# Patient Record
Sex: Female | Born: 1975 | Race: White | Hispanic: No | Marital: Married | Smoking: Current every day smoker
Health system: Southern US, Community
[De-identification: ages and names within clinical notes are randomized; demographics above are authoritative.]

## PROBLEM LIST (undated history)

## (undated) DIAGNOSIS — F17200 Nicotine dependence, unspecified, uncomplicated: Secondary | ICD-10-CM

## (undated) DIAGNOSIS — I639 Cerebral infarction, unspecified: Secondary | ICD-10-CM

## (undated) DIAGNOSIS — M75100 Unspecified rotator cuff tear or rupture of unspecified shoulder, not specified as traumatic: Secondary | ICD-10-CM

## (undated) DIAGNOSIS — N870 Mild cervical dysplasia: Secondary | ICD-10-CM

## (undated) DIAGNOSIS — G610 Guillain-Barre syndrome: Secondary | ICD-10-CM

## (undated) DIAGNOSIS — R87619 Unspecified abnormal cytological findings in specimens from cervix uteri: Secondary | ICD-10-CM

## (undated) DIAGNOSIS — R87810 Cervical high risk human papillomavirus (HPV) DNA test positive: Secondary | ICD-10-CM

## (undated) DIAGNOSIS — F419 Anxiety disorder, unspecified: Secondary | ICD-10-CM

## (undated) HISTORY — PX: FINGER SURGERY: SHX640

## (undated) HISTORY — DX: Nicotine dependence, unspecified, uncomplicated: F17.200

## (undated) HISTORY — PX: CHOLECYSTECTOMY: SHX55

## (undated) HISTORY — DX: Mild cervical dysplasia: N87.0

## (undated) HISTORY — DX: Guillain-Barre syndrome: G61.0

## (undated) HISTORY — DX: Anxiety disorder, unspecified: F41.9

## (undated) HISTORY — DX: Unspecified rotator cuff tear or rupture of unspecified shoulder, not specified as traumatic: M75.100

## (undated) HISTORY — DX: Unspecified abnormal cytological findings in specimens from cervix uteri: R87.619

## (undated) HISTORY — DX: Cervical high risk human papillomavirus (HPV) DNA test positive: R87.810

---

## 2015-05-22 ENCOUNTER — Emergency Department
Admission: EM | Admit: 2015-05-22 | Discharge: 2015-05-22 | Disposition: A | Discharge status: Home / Self Care | Payer: Worker's Compensation | Attending: Emergency Medicine | Admitting: Emergency Medicine

## 2015-05-22 ENCOUNTER — Emergency Department: Payer: Self-pay

## 2015-05-22 ENCOUNTER — Encounter: Payer: Self-pay | Admitting: Emergency Medicine

## 2015-05-22 DIAGNOSIS — M25512 Pain in left shoulder: Secondary | ICD-10-CM

## 2015-05-22 DIAGNOSIS — Z720 Tobacco use: Secondary | ICD-10-CM | POA: Insufficient documentation

## 2015-05-22 HISTORY — DX: Cerebral infarction, unspecified: I63.9

## 2015-05-22 MED ORDER — OXYCODONE-ACETAMINOPHEN 5-325 MG PO TABS
1.0000 | ORAL_TABLET | Freq: Once | ORAL | Status: AC
  Administered 2015-05-22: 1 via ORAL
  Filled 2015-05-22: qty 1

## 2015-05-22 MED ORDER — OXYCODONE-ACETAMINOPHEN 5-325 MG PO TABS: 1.0000 | ORAL_TABLET | Freq: Four times a day (QID) | ORAL | Status: DC | PRN

## 2015-05-22 NOTE — Discharge Instructions (Signed)

## 2015-05-22 NOTE — ED Notes (Addendum)
Patient ambulatory to triage with steady gait, without difficulty or distress noted; pt reports while at work Alexandria Murray) began having pain to left shoulder after using hammer; workers comp profile indicates UDS required

## 2015-05-22 NOTE — ED Provider Notes (Signed)
[facility] Emergency Department Provider Note  ____________________________________________  Time seen:   I have reviewed the triage vital signs and the nursing notes.   HISTORY  Chief Complaint Shoulder Pain      HPI Alexandria Murray is a 39 y.o. female presents with nontraumatic left shoulder pain currently 9 out of 10. Patient states while using a 4 pound hammer at work she started expressing left shoulder pain radiating into the left arm/forearm. Patient denies any neck pain    Past Medical History  Diagnosis Date  . Stroke     There are no active problems to display for this patient.   Past Surgical History  Procedure Laterality Date  . Cholecystectomy    . Finger surgery      Current Outpatient Rx  Name  Route  Sig  Dispense  Refill  . oxyCODONE-acetaminophen (PERCOCET/ROXICET) 5-325 MG per tablet   Oral   Take 1 tablet by mouth every 6 (six) hours as needed for severe pain.   20 tablet   0     Allergies Review of patient's allergies indicates no known allergies.  No family history on file.  Social History History  Substance Use Topics  . Smoking status: Current Every Day Smoker -- 2.00 packs/day    Types: Cigarettes  . Smokeless tobacco: Not on file  . Alcohol Use: No    Review of Systems  Constitutional: Negative for fever. Eyes: Negative for visual changes. ENT: Negative for sore throat. Cardiovascular: Negative for chest pain. Respiratory: Negative for shortness of breath. Gastrointestinal: Negative for abdominal pain, vomiting and diarrhea. Genitourinary: Negative for dysuria. Musculoskeletal: Negative for back pain. Skin: Negative for rash. Neurological: Negative for headaches, focal weakness or numbness.   10-point ROS otherwise negative.  ____________________________________________   PHYSICAL EXAM:  VITAL SIGNS: ED Triage Vitals  Enc Vitals Group     BP 05/22/15 0450 178/106 mmHg     Pulse Rate  05/22/15 0450 86     Resp 05/22/15 0450 18     Temp 05/22/15 0450 97.9 F (36.6 C)     Temp Source 05/22/15 0450 Oral     SpO2 05/22/15 0450 99 %     Weight 05/22/15 0450 144 lb (65.318 kg)     Height 05/22/15 0450  (1.549 m)     Head Cir --      Peak Flow --      Pain Score 05/22/15 0451 8     Pain Loc --      Pain Edu? --      Excl. in GC? --      Constitutional: Alert and oriented. Well appearing and in no distress. Eyes: Conjunctivae are normal. PERRL. Normal extraocular movements. ENT   Head: Normocephalic and atraumatic.   Nose: No congestion/rhinnorhea.   Mouth/Throat: Mucous membranes are moist.   Neck: No stridor. Cardiovascular: Normal rate, regular rhythm. Normal and symmetric distal pulses are present in all extremities. No murmurs, rubs, or gallops. Respiratory: Normal respiratory effort without tachypnea nor retractions. Breath sounds are clear and equal bilaterally. No wheezes/rales/rhonchi. Gastrointestinal: Soft and nontender. No distention. There is no CVA tenderness. Genitourinary: deferred Musculoskeletal: Nontender with normal range of motion in all extremities. No joint effusions.  Pain with palpation of the left shoulder  Neurologic:  Normal speech and language. No gross focal neurologic deficits are appreciated. Speech is normal.  Skin:  Skin is warm, dry and intact. No rash noted. Psychiatric: Mood and affect are normal. Speech  and behavior are normal. Patient exhibits appropriate insight and judgment.     INITIAL IMPRESSION / ASSESSMENT AND PLAN / ED COURSE  Pertinent labs & imaging results that were available during my care of the patient were reviewed by me and considered in my medical decision making (see chart for details).  Esther physical exam consistent with possible rotator cuff injury as such patient will be referred to Dr. Rosita Kea outpatient evaluation.  ____________________________________________   FINAL CLINICAL  IMPRESSION(S) / ED DIAGNOSES  Final diagnoses:  Left shoulder pain      Darci Current, MD 05/22/15 [phone]

## 2015-05-22 NOTE — ED Notes (Signed)
Pt reports pain to left shoulder x 1 night.  Pt unsure of injury.  Pt reports repetitive motion at work but denies hx of similar pain.   Limited ROM, pt unable to raise arm over her head.

## 2015-05-24 ENCOUNTER — Emergency Department
Admission: EM | Admit: 2015-05-24 | Discharge: 2015-05-24 | Disposition: A | Discharge status: Home / Self Care | Payer: Managed Care, Other (non HMO) | Attending: Student | Admitting: Student

## 2015-05-24 DIAGNOSIS — Z720 Tobacco use: Secondary | ICD-10-CM | POA: Diagnosis not present

## 2015-05-24 DIAGNOSIS — Z79899 Other long term (current) drug therapy: Secondary | ICD-10-CM | POA: Diagnosis not present

## 2015-05-24 DIAGNOSIS — R112 Nausea with vomiting, unspecified: Secondary | ICD-10-CM | POA: Diagnosis present

## 2015-05-24 LAB — COMPREHENSIVE METABOLIC PANEL
ALBUMIN: 4.3 g/dL (ref 3.5–5.0)
ALT: 33 U/L (ref 14–54)
AST: 32 U/L (ref 15–41)
Alkaline Phosphatase: 76 U/L (ref 38–126)
Anion gap: 13 (ref 5–15)
BUN: 18 mg/dL (ref 6–20)
CHLORIDE: 101 mmol/L (ref 101–111)
CO2: 22 mmol/L (ref 22–32)
CREATININE: 0.62 mg/dL (ref 0.44–1.00)
Calcium: 9.2 mg/dL (ref 8.9–10.3)
GFR calc Af Amer: >60 mL/min (ref >60)
GFR calc non Af Amer: >60 mL/min (ref >60)
GLUCOSE: 96 mg/dL (ref 65–99)
POTASSIUM: 3.6 mmol/L (ref 3.5–5.1)
Sodium: 136 mmol/L (ref 135–145)
Total Bilirubin: 1.1 mg/dL (ref 0.3–1.2)
Total Protein: 7.7 g/dL (ref 6.5–8.1)

## 2015-05-24 LAB — HCG, QUANTITATIVE, PREGNANCY: HCG, BETA CHAIN, QUANT, S: 3 m[IU]/mL (ref <5)

## 2015-05-24 LAB — CBC
HCT: 51.5 % — ABNORMAL HIGH (ref 35.0–47.0)
HEMOGLOBIN: 17.4 g/dL — AB (ref 12.0–16.0)
MCH: 30.9 pg (ref 26.0–34.0)
MCHC: 33.8 g/dL (ref 32.0–36.0)
MCV: 91.5 fL (ref 80.0–100.0)
Platelets: 241 10*3/uL (ref 150–440)
RBC: 5.63 MIL/uL — AB (ref 3.80–5.20)
RDW: 12.8 % (ref 11.5–14.5)
WBC: 9.7 10*3/uL (ref 3.6–11.0)

## 2015-05-24 LAB — LIPASE, BLOOD: Lipase: 28 U/L (ref 22–51)

## 2015-05-24 MED ORDER — ONDANSETRON 4 MG PO TBDP
4.0000 mg | ORAL_TABLET | Freq: Once | ORAL | Status: AC
  Administered 2015-05-24: 4 mg via ORAL
  Filled 2015-05-24: qty 1

## 2015-05-24 MED ORDER — TRAMADOL HCL 50 MG PO TABS: 50.0000 mg | ORAL_TABLET | Freq: Three times a day (TID) | ORAL | Status: DC | PRN

## 2015-05-24 MED ORDER — ONDANSETRON 4 MG PO TBDP: 4.0000 mg | ORAL_TABLET | Freq: Three times a day (TID) | ORAL | Status: DC | PRN

## 2015-05-24 NOTE — ED Notes (Signed)
Pt vomiting since she left ER. C/o weakness from vomiting, unable to eat or drink. Pt alert and oriented X4, active, cooperative, pt in NAD. RR even and unlabored, color WNL.

## 2015-05-24 NOTE — ED Provider Notes (Signed)
[facility] Emergency Department Provider Note  ____________________________________________  Time seen: Approximately 3:06 PM  I have reviewed the triage vital signs and the nursing notes.   HISTORY  Chief Complaint Emesis    HPI Alexandria Murray is a 39 y.o. female with history of CVA who presents for evaluation of vomiting, sudden onset, constant since the morning of 05/22/2015. The patient was seen in this emergency department diagnosed with rotator cuff injury in the left arm. She was started on Percocet on 7/26 for pain and reports that since she started taking Percocet, she has had vomiting. The emesis is nonbloody, nonbilious. She denies any abdominal pain, fevers, chest pain or difficulty breathing. She thinks this may be medication related. No other modifying factors.   Past Medical History  Diagnosis Date  . Stroke     There are no active problems to display for this patient.   Past Surgical History  Procedure Laterality Date  . Cholecystectomy    . Finger surgery      Current Outpatient Rx  Name  Route  Sig  Dispense  Refill  . ondansetron (ZOFRAN ODT) 4 MG disintegrating tablet   Oral   Take 1 tablet (4 mg total) by mouth every 8 (eight) hours as needed for nausea or vomiting.   12 tablet   0   . oxyCODONE-acetaminophen (PERCOCET/ROXICET) 5-325 MG per tablet   Oral   Take 1 tablet by mouth every 6 (six) hours as needed for severe pain.   20 tablet   0   . traMADol (ULTRAM) 50 MG tablet   Oral   Take 1 tablet (50 mg total) by mouth every 8 (eight) hours as needed for moderate pain.   15 tablet   0     Allergies Review of patient's allergies indicates no known allergies.  No family history on file.  Social History History  Substance Use Topics  . Smoking status: Current Every Day Smoker -- 2.00 packs/day    Types: Cigarettes  . Smokeless tobacco: Not on file  . Alcohol Use: No    Review of Systems Constitutional: No  fever/chills Eyes: No visual changes. ENT: No sore throat. Cardiovascular: Denies chest pain. Respiratory: Denies shortness of breath. Gastrointestinal: No abdominal pain.  + nausea, +vomiting.  No diarrhea.  No constipation. Genitourinary: Negative for dysuria. Musculoskeletal: Negative for back pain. Skin: Negative for rash. Neurological: Negative for headaches, focal weakness or numbness.  10-point ROS otherwise negative.  ____________________________________________   PHYSICAL EXAM:  VITAL SIGNS: ED Triage Vitals  Enc Vitals Group     BP 05/24/15 1242 161/105 mmHg     Pulse Rate 05/24/15 1242 79     Resp 05/24/15 1242 16     Temp 05/24/15 1242 98.6 F (37 C)     Temp Source 05/24/15 1242 Oral     SpO2 05/24/15 1242 98 %     Weight 05/24/15 1242 140 lb (63.504 kg)     Height 05/24/15 1242 5\' 2"  (1.575 m)     Head Cir --      Peak Flow --      Pain Score 05/24/15 1242 8     Pain Loc --      Pain Edu? --      Excl. in GC? --     Constitutional: Alert and oriented. Well appearing and in no acute distress. Eyes: Conjunctivae are normal. PERRL. EOMI. Head: Atraumatic. Nose: No congestion/rhinnorhea. Mouth/Throat: Mucous membranes are moist.  Oropharynx non-erythematous.  Neck: No stridor.   Cardiovascular: Normal rate, regular rhythm. Grossly normal heart sounds.  Good peripheral circulation. Respiratory: Normal respiratory effort.  No retractions. Lungs CTAB. Gastrointestinal: Soft and nontender. No distention. No abdominal bruits. No CVA tenderness. Genitourinary: deferred Musculoskeletal: Tenderness to palpation in the left shoulder. Left arm in sling for comfort. Neurologic:  Normal speech and language. No gross focal neurologic deficits are appreciated. No gait instability. Skin:  Skin is warm, dry and intact. No rash noted. Psychiatric: Mood and affect are normal. Speech and behavior are normal.  ____________________________________________   LABS (all labs  ordered are listed, but only abnormal results are displayed)  Labs Reviewed  CBC - Abnormal; Notable for the following:    RBC 5.63 (*)    Hemoglobin 17.4 (*)    HCT 51.5 (*)    All other components within normal limits  LIPASE, BLOOD  COMPREHENSIVE METABOLIC PANEL  HCG, QUANTITATIVE, PREGNANCY  URINALYSIS COMPLETEWITH MICROSCOPIC ([facility] ONLY)   ____________________________________________  EKG  none ____________________________________________  RADIOLOGY  none ____________________________________________   PROCEDURES  Procedure(s) performed: None  Critical Care performed: No  ____________________________________________   INITIAL IMPRESSION / ASSESSMENT AND PLAN / ED COURSE  Pertinent labs & imaging results that were available during my care of the patient were reviewed by me and considered in my medical decision making (see chart for details).  Alexandria Murray is a 38 y.o. female with history of CVA who presents for evaluation of vomiting, sudden onset, constant since the morning of 05/22/2015. On exam, she is very well-appearing and in no acute distress. Vital signs stable with the exception of moderate asymptomatic hypertension, she is afebrile. Abdomen is soft, nontender, nondistended, no rebound or guarding. Doubt any acute life-threatening intra-abdominal process in this very well-appearing patient with benign examination. Labs notable for hemoconcentration and suspect mild dehydration. Labs are otherwise unremarkable. Suspect vomiting secondary to sensitivity to Percocet. We'll give Zofran and anticipate discharge with Ultram, additional ODT Zofran if she successfully by mouth challenges.  ----------------------------------------- 5:16 PM on 05/24/2015 ----------------------------------------- Patient tolerating by mouth intake without any vomiting after Zofran. Discussed that she will discontinue Percocet and start taking Ultram. Discussed return precautions and she  is comfortable with PCP follow-up. We will discharge with additional diagnosis of hypertension and recommend she follow-up with primary care doctor in 1 week for recheck.  ____________________________________________   FINAL CLINICAL IMPRESSION(S) / ED DIAGNOSES  Final diagnoses:  Non-intractable vomiting with nausea, vomiting of unspecified type      Gayla Doss, MD 05/24/15 1717

## 2015-05-28 ENCOUNTER — Inpatient Hospital Stay
Admission: EM | Admit: 2015-05-28 | Discharge: 2015-06-05 | DRG: 095 | Disposition: A | Discharge status: Home Health | Payer: Managed Care, Other (non HMO) | Attending: Internal Medicine | Admitting: Internal Medicine

## 2015-05-28 ENCOUNTER — Emergency Department: Payer: Managed Care, Other (non HMO)

## 2015-05-28 ENCOUNTER — Encounter: Payer: Self-pay | Admitting: Emergency Medicine

## 2015-05-28 DIAGNOSIS — R0600 Dyspnea, unspecified: Secondary | ICD-10-CM | POA: Diagnosis not present

## 2015-05-28 DIAGNOSIS — I10 Essential (primary) hypertension: Secondary | ICD-10-CM

## 2015-05-28 DIAGNOSIS — F17210 Nicotine dependence, cigarettes, uncomplicated: Secondary | ICD-10-CM

## 2015-05-28 DIAGNOSIS — Z79891 Long term (current) use of opiate analgesic: Secondary | ICD-10-CM | POA: Diagnosis not present

## 2015-05-28 DIAGNOSIS — Z720 Tobacco use: Secondary | ICD-10-CM | POA: Diagnosis not present

## 2015-05-28 DIAGNOSIS — Z79899 Other long term (current) drug therapy: Secondary | ICD-10-CM | POA: Diagnosis not present

## 2015-05-28 DIAGNOSIS — J449 Chronic obstructive pulmonary disease, unspecified: Secondary | ICD-10-CM | POA: Diagnosis present

## 2015-05-28 DIAGNOSIS — Z7982 Long term (current) use of aspirin: Secondary | ICD-10-CM | POA: Diagnosis not present

## 2015-05-28 DIAGNOSIS — G510 Bell's palsy: Secondary | ICD-10-CM

## 2015-05-28 DIAGNOSIS — I69351 Hemiplegia and hemiparesis following cerebral infarction affecting right dominant side: Secondary | ICD-10-CM | POA: Diagnosis not present

## 2015-05-28 DIAGNOSIS — N3090 Cystitis, unspecified without hematuria: Secondary | ICD-10-CM

## 2015-05-28 DIAGNOSIS — R29898 Other symptoms and signs involving the musculoskeletal system: Secondary | ICD-10-CM

## 2015-05-28 DIAGNOSIS — M7582 Other shoulder lesions, left shoulder: Secondary | ICD-10-CM

## 2015-05-28 DIAGNOSIS — M778 Other enthesopathies, not elsewhere classified: Secondary | ICD-10-CM

## 2015-05-28 DIAGNOSIS — M779 Enthesopathy, unspecified: Secondary | ICD-10-CM | POA: Diagnosis present

## 2015-05-28 DIAGNOSIS — R1310 Dysphagia, unspecified: Secondary | ICD-10-CM

## 2015-05-28 DIAGNOSIS — M25512 Pain in left shoulder: Secondary | ICD-10-CM

## 2015-05-28 DIAGNOSIS — G610 Guillain-Barre syndrome: Principal | ICD-10-CM | POA: Diagnosis present

## 2015-05-28 LAB — CBC
HCT: 54.9 % — ABNORMAL HIGH (ref 35.0–47.0)
HCT: 50.9 % — ABNORMAL HIGH (ref 35.0–47.0)
Hemoglobin: 18.8 g/dL — ABNORMAL HIGH (ref 12.0–16.0)
Hemoglobin: 17.1 g/dL — ABNORMAL HIGH (ref 12.0–16.0)
MCH: 31.3 pg (ref 26.0–34.0)
MCH: 30.8 pg (ref 26.0–34.0)
MCHC: 34.3 g/dL (ref 32.0–36.0)
MCHC: 33.5 g/dL (ref 32.0–36.0)
MCV: 91.2 fL (ref 80.0–100.0)
MCV: 91.9 fL (ref 80.0–100.0)
PLATELETS: 277 10*3/uL (ref 150–440)
Platelets: 224 10*3/uL (ref 150–440)
RBC: 6.02 MIL/uL — ABNORMAL HIGH (ref 3.80–5.20)
RBC: 5.54 MIL/uL — AB (ref 3.80–5.20)
RDW: 12.9 % (ref 11.5–14.5)
RDW: 13 % (ref 11.5–14.5)
WBC: 17.7 10*3/uL — ABNORMAL HIGH (ref 3.6–11.0)
WBC: 12.7 10*3/uL — ABNORMAL HIGH (ref 3.6–11.0)

## 2015-05-28 LAB — BASIC METABOLIC PANEL
Anion gap: 10 (ref 5–15)
BUN: 23 mg/dL — ABNORMAL HIGH (ref 6–20)
CALCIUM: 9.6 mg/dL (ref 8.9–10.3)
CHLORIDE: 102 mmol/L (ref 101–111)
CO2: 22 mmol/L (ref 22–32)
Creatinine, Ser: 0.69 mg/dL (ref 0.44–1.00)
GFR calc non Af Amer: >60 mL/min (ref >60)
GLUCOSE: 114 mg/dL — AB (ref 65–99)
Potassium: 3.7 mmol/L (ref 3.5–5.1)
Sodium: 134 mmol/L — ABNORMAL LOW (ref 135–145)

## 2015-05-28 LAB — CSF CELL COUNT WITH DIFFERENTIAL
EOS CSF: 0 % (ref 0–1)
Eosinophils, CSF: 0 % (ref 0–1)
LYMPHS CSF: 0 % — AB (ref 40–80)
LYMPHS CSF: 50 % (ref 40–80)
MONOCYTE-MACROPHAGE-SPINAL FLUID: 17 % (ref 15–45)
Monocyte-Macrophage-Spinal Fluid: 0 % — ABNORMAL LOW (ref 15–45)
OTHER CELLS CSF: 0
Other Cells, CSF: 0
RBC COUNT CSF: 3 /mm3 — AB
RBC COUNT CSF: 86 /mm3 — AB
SEGMENTED NEUTROPHILS-CSF: 0 % (ref 0–6)
SEGMENTED NEUTROPHILS-CSF: 33 % — AB (ref 0–6)
TUBE #: 1
Tube #: 3
WBC, CSF: 0 /mm3 (ref 0–5)
WBC, CSF: 3 /mm3 (ref 0–5)

## 2015-05-28 LAB — URINALYSIS COMPLETE WITH MICROSCOPIC (ARMC ONLY)
BACTERIA UA: NONE SEEN
BILIRUBIN URINE: NEGATIVE
Glucose, UA: NEGATIVE mg/dL
Nitrite: NEGATIVE
PH: 5 (ref 5.0–8.0)
Protein, ur: >500 mg/dL — AB
Specific Gravity, Urine: 1.031 — ABNORMAL HIGH (ref 1.005–1.030)

## 2015-05-28 LAB — CREATININE, SERUM: Creatinine, Ser: 0.56 mg/dL (ref 0.44–1.00)

## 2015-05-28 LAB — GLUCOSE, CAPILLARY: GLUCOSE-CAPILLARY: 107 mg/dL — AB (ref 65–99)

## 2015-05-28 LAB — POCT PREGNANCY, URINE: Preg Test, Ur: NEGATIVE

## 2015-05-28 LAB — PROTEIN AND GLUCOSE, CSF
GLUCOSE CSF: 72 mg/dL — AB (ref 40–70)
TOTAL PROTEIN, CSF: 278 mg/dL — AB (ref 15–45)

## 2015-05-28 LAB — CK: CK TOTAL: 53 U/L (ref 38–234)

## 2015-05-28 MED ORDER — GADOBENATE DIMEGLUMINE 529 MG/ML IV SOLN
15.0000 mL | Freq: Once | INTRAVENOUS | Status: AC | PRN
  Administered 2015-05-28: 13 mL via INTRAVENOUS
  Filled 2015-05-28: qty 15

## 2015-05-28 MED ORDER — METOPROLOL TARTRATE 1 MG/ML IV SOLN
INTRAVENOUS | Status: AC
  Administered 2015-05-28: 5 mg via INTRAVENOUS
  Filled 2015-05-28: qty 5

## 2015-05-28 MED ORDER — IMMUNE GLOBULIN (HUMAN) 20 GM/200ML IV SOLN
400.0000 mg/kg | INTRAVENOUS | Status: AC
  Administered 2015-05-28 – 2015-06-01 (×5): 25 g via INTRAVENOUS
  Filled 2015-05-28 (×7): qty 50

## 2015-05-28 MED ORDER — DEXTROSE 5 % IV SOLN
INTRAVENOUS | Status: AC
  Filled 2015-05-28: qty 10

## 2015-05-28 MED ORDER — ACETAMINOPHEN 325 MG PO TABS
ORAL_TABLET | ORAL | Status: AC
  Administered 2015-05-28: 650 mg via ORAL
  Filled 2015-05-28: qty 2

## 2015-05-28 MED ORDER — ONDANSETRON 4 MG PO TBDP
4.0000 mg | ORAL_TABLET | Freq: Three times a day (TID) | ORAL | Status: DC | PRN
  Filled 2015-05-28: qty 1

## 2015-05-28 MED ORDER — DEXTROSE 5 % IV SOLN
1.0000 g | Freq: Once | INTRAVENOUS | Status: AC
  Administered 2015-05-28: 1 g via INTRAVENOUS
  Filled 2015-05-28 (×2): qty 10

## 2015-05-28 MED ORDER — ENOXAPARIN SODIUM 40 MG/0.4ML ~~LOC~~ SOLN
40.0000 mg | SUBCUTANEOUS | Status: DC
  Administered 2015-05-28 – 2015-06-04 (×8): 40 mg via SUBCUTANEOUS
  Filled 2015-05-28 (×8): qty 0.4

## 2015-05-28 MED ORDER — LIDOCAINE HCL (PF) 1 % IJ SOLN
INTRAMUSCULAR | Status: AC
  Filled 2015-05-28: qty 5

## 2015-05-28 MED ORDER — IBUPROFEN 600 MG PO TABS
600.0000 mg | ORAL_TABLET | Freq: Four times a day (QID) | ORAL | Status: DC | PRN
  Administered 2015-06-04: 22:00:00 600 mg via ORAL
  Filled 2015-05-28: qty 1

## 2015-05-28 MED ORDER — METOPROLOL TARTRATE 1 MG/ML IV SOLN
5.0000 mg | Freq: Once | INTRAVENOUS | Status: AC
  Administered 2015-05-28: 5 mg via INTRAVENOUS
  Filled 2015-05-28: qty 5

## 2015-05-28 MED ORDER — SODIUM CHLORIDE 0.9 % IV SOLN
Freq: Once | INTRAVENOUS | Status: AC
  Administered 2015-05-28: 19:00:00 via INTRAVENOUS

## 2015-05-28 MED ORDER — SODIUM CHLORIDE 0.9 % IV SOLN
Freq: Once | INTRAVENOUS | Status: AC
  Administered 2015-05-28: 16:00:00 via INTRAVENOUS

## 2015-05-28 MED ORDER — DIAZEPAM 5 MG/ML IJ SOLN
INTRAMUSCULAR | Status: AC
  Administered 2015-05-28: 5 mg via INTRAVENOUS
  Filled 2015-05-28: qty 2

## 2015-05-28 MED ORDER — HYDROMORPHONE HCL 1 MG/ML IJ SOLN
INTRAMUSCULAR | Status: AC
  Administered 2015-05-28: 1 mg via INTRAVENOUS
  Filled 2015-05-28: qty 1

## 2015-05-28 MED ORDER — TRAMADOL HCL 50 MG PO TABS
50.0000 mg | ORAL_TABLET | Freq: Three times a day (TID) | ORAL | Status: DC | PRN
  Administered 2015-05-29 – 2015-06-05 (×9): 50 mg via ORAL
  Filled 2015-05-28 (×9): qty 1

## 2015-05-28 MED ORDER — ONDANSETRON HCL 4 MG/2ML IJ SOLN: 4.0000 mg | Freq: Four times a day (QID) | INTRAMUSCULAR | Status: DC | PRN

## 2015-05-28 MED ORDER — DEXTROSE 5 % IV SOLN
1.0000 g | INTRAVENOUS | Status: DC
  Administered 2015-05-29: 1 g via INTRAVENOUS
  Filled 2015-05-28 (×2): qty 10

## 2015-05-28 MED ORDER — DOCUSATE SODIUM 100 MG PO CAPS
100.0000 mg | ORAL_CAPSULE | Freq: Two times a day (BID) | ORAL | Status: DC
  Administered 2015-05-28 – 2015-06-05 (×9): 100 mg via ORAL
  Filled 2015-05-28 (×13): qty 1

## 2015-05-28 MED ORDER — HYDROCODONE-ACETAMINOPHEN 5-325 MG PO TABS
ORAL_TABLET | ORAL | Status: AC
  Administered 2015-05-28: 1 via ORAL
  Filled 2015-05-28: qty 1

## 2015-05-28 MED ORDER — HYDRALAZINE HCL 20 MG/ML IJ SOLN
INTRAMUSCULAR | Status: AC
  Administered 2015-05-28: 10 mg via INTRAVENOUS
  Filled 2015-05-28: qty 1

## 2015-05-28 MED ORDER — MULTIVITAMIN PO LIQD: Freq: Every day | ORAL | Status: DC

## 2015-05-28 MED ORDER — OXYCODONE-ACETAMINOPHEN 5-325 MG PO TABS
ORAL_TABLET | ORAL | Status: AC
  Filled 2015-05-28: qty 1

## 2015-05-28 MED ORDER — ACETAMINOPHEN 325 MG PO TABS
650.0000 mg | ORAL_TABLET | Freq: Four times a day (QID) | ORAL | Status: DC | PRN
  Administered 2015-05-28 – 2015-05-31 (×5): 650 mg via ORAL
  Filled 2015-05-28 (×4): qty 2

## 2015-05-28 MED ORDER — OXYCODONE-ACETAMINOPHEN 5-325 MG PO TABS: 1.0000 | ORAL_TABLET | Freq: Four times a day (QID) | ORAL | Status: DC | PRN

## 2015-05-28 MED ORDER — HYDROCODONE-ACETAMINOPHEN 5-325 MG PO TABS
1.0000 | ORAL_TABLET | Freq: Once | ORAL | Status: AC
  Administered 2015-05-28: 1 via ORAL

## 2015-05-28 MED ORDER — DIAZEPAM 5 MG/ML IJ SOLN
5.0000 mg | Freq: Once | INTRAMUSCULAR | Status: AC
  Administered 2015-05-28: 5 mg via INTRAVENOUS

## 2015-05-28 MED ORDER — HYDROMORPHONE HCL 1 MG/ML IJ SOLN
1.0000 mg | Freq: Once | INTRAMUSCULAR | Status: AC
  Administered 2015-05-28: 1 mg via INTRAVENOUS

## 2015-05-28 MED ORDER — ASPIRIN-ACETAMINOPHEN-CAFFEINE 250-250-65 MG PO TABS
1.0000 | ORAL_TABLET | Freq: Every day | ORAL | Status: DC
  Administered 2015-05-29 – 2015-05-30 (×2): 1 via ORAL
  Filled 2015-05-28 (×4): qty 1

## 2015-05-28 MED ORDER — ADULT MULTIVITAMIN LIQUID CH
5.0000 mL | Freq: Every day | ORAL | Status: DC
  Administered 2015-05-29 – 2015-05-30 (×2): 5 mL via ORAL
  Filled 2015-05-28 (×4): qty 5

## 2015-05-28 MED ORDER — ONDANSETRON HCL 4 MG PO TABS
4.0000 mg | ORAL_TABLET | Freq: Four times a day (QID) | ORAL | Status: DC | PRN
  Administered 2015-05-29: 4 mg via ORAL
  Filled 2015-05-28: qty 1

## 2015-05-28 MED ORDER — HYDRALAZINE HCL 20 MG/ML IJ SOLN
10.0000 mg | INTRAMUSCULAR | Status: DC | PRN
  Administered 2015-05-28 – 2015-06-04 (×6): 10 mg via INTRAVENOUS
  Filled 2015-05-28 (×5): qty 1

## 2015-05-28 MED ORDER — ACETAMINOPHEN 650 MG RE SUPP
650.0000 mg | Freq: Four times a day (QID) | RECTAL | Status: DC | PRN
  Filled 2015-05-28: qty 1

## 2015-05-28 MED ORDER — FERROUS SULFATE 325 (65 FE) MG PO TABS
325.0000 mg | ORAL_TABLET | Freq: Every day | ORAL | Status: DC
  Administered 2015-05-28 – 2015-06-05 (×7): 325 mg via ORAL
  Filled 2015-05-28 (×7): qty 1

## 2015-05-28 NOTE — Progress Notes (Signed)
Patient seen for NIF measuring. Patient in no distress at the time. RR 18 HR 100 BBS essentially clear. Patient able to perform procedure x3. -30/-40 cm h20 noted. Patient tolerated well

## 2015-05-28 NOTE — ED Provider Notes (Addendum)
[facility] Emergency Department Provider Note     Time seen: ----------------------------------------- 2:21 PM on 05/28/2015 -----------------------------------------    I have reviewed the triage vital signs and the nursing notes.   HISTORY  Chief Complaint Weakness    HPI Alexandria Murray is a 39 y.o. female who presents ER with generalized weakness. Patient reports walking and reports arms legs are asleep. Patient states she's fallen twice a day, reports her entire face feels funny. Husband reports patient has history of stroke. Symptoms started last Wednesday, she's been seen here several times his been seen at [facility] for shoulder pain. Shoulder pain is been treated with Motrin which has helped the pain, but she describes as worsening paresthesias and lower extremity weakness.   Past Medical History  Diagnosis Date  . Stroke     There are no active problems to display for this patient.   Past Surgical History  Procedure Laterality Date  . Cholecystectomy    . Finger surgery      Allergies Review of patient's allergies indicates no known allergies.  Social History History  Substance Use Topics  . Smoking status: Current Every Day Smoker -- 2.00 packs/day    Types: Cigarettes  . Smokeless tobacco: Not on file  . Alcohol Use: No    Review of Systems Constitutional: Negative for fever. Eyes: Negative for visual changes. ENT: Negative for sore throat. Cardiovascular: Negative for chest pain. Respiratory: Negative for shortness of breath. Gastrointestinal: Negative for abdominal pain, vomiting and diarrhea. Genitourinary: Negative for dysuria. Musculoskeletal: Positive for back pain Skin: Negative for rash. Neurological: Positive for numbness, progressive lower extremity weakness.  10-point ROS otherwise negative.  ____________________________________________   PHYSICAL EXAM:  VITAL SIGNS: ED Triage Vitals  Enc Vitals Group   BP 05/28/15 1243 141/118 mmHg     Pulse Rate 05/28/15 1243 129     Resp 05/28/15 1243 16     Temp 05/28/15 1243 98.1 F (36.7 C)     Temp Source 05/28/15 1243 Oral     SpO2 05/28/15 1243 97 %     Weight 05/28/15 1243 144 lb (65.318 kg)     Height 05/28/15 1243  (1.549 m)     Head Cir --      Peak Flow --      Pain Score 05/28/15 1244 10     Pain Loc --      Pain Edu? --      Excl. in GC? --     Constitutional: Alert and oriented. Well appearing and in no distress. Eyes: Conjunctivae are normal. PERRL. Normal extraocular movements. ENT   Head: Normocephalic and atraumatic.   Nose: No congestion/rhinnorhea.   Mouth/Throat: Mucous membranes are moist.   Neck: No stridor. Cardiovascular: Normal rate, regular rhythm. Normal and symmetric distal pulses are present in all extremities. No murmurs, rubs, or gallops. Respiratory: Normal respiratory effort without tachypnea nor retractions. Breath sounds are clear and equal bilaterally. No wheezes/rales/rhonchi. Gastrointestinal: Soft and nontender. No distention. No abdominal bruits.  Musculoskeletal: Patient with decreased range of motion of all of her extremities. No joint effusions.  No lower extremity tenderness nor edema. Neurologic:  Normal speech and language. Patient with markedly lower extremity weakness. She is areflexic on patellar tendon reflex exam. Brachioradialis reflex appears normal. There are paresthesias noted on the hands and feet. Also on the face. Skin:  Skin is warm, dry and intact. No rash noted. Psychiatric: Mood and affect are normal. Speech and behavior are normal.  Patient exhibits appropriate insight and judgment. ____________________________________________  EKG: Interpreted by me. Sinus tachycardia with a rate of 120 bpm, normal PR interval, normal QRS with, normal QT interval. There are nonspecific ST-T wave changes  ____________________________________________  ED COURSE:  Pertinent labs &  imaging results that were available during my care of the patient were reviewed by me and considered in my medical decision making (see chart for details). Patient clinically may have Guillain-Barr. Will need labs and CT imaging and LP likely. ____________________________________________    LABS (pertinent positives/negatives)  Labs Reviewed  BASIC METABOLIC PANEL - Abnormal; Notable for the following:    Sodium 134 (*)    Glucose, Bld 114 (*)    BUN 23 (*)    All other components within normal limits  CBC - Abnormal; Notable for the following:    WBC 17.7 (*)    RBC 6.02 (*)    Hemoglobin 18.8 (*)    HCT 54.9 (*)    All other components within normal limits  URINALYSIS COMPLETEWITH MICROSCOPIC ([facility] ONLY) - Abnormal; Notable for the following:    Color, Urine AMBER (*)    APPearance HAZY (*)    Ketones, ur 1+ (*)    Specific Gravity, Urine 1.031 (*)    Hgb urine dipstick 3+ (*)    Protein, ur >500 (*)    Leukocytes, UA TRACE (*)    Squamous Epithelial / LPF 6-30 (*)    All other components within normal limits  CSF CULTURE  GRAM STAIN  CSF CELL COUNT WITH DIFFERENTIAL  CSF CELL COUNT WITH DIFFERENTIAL  PROTEIN AND GLUCOSE, CSF  POC URINE PREG, ED  POCT PREGNANCY, URINE    RADIOLOGY Images were viewed by me  CT head IMPRESSION: 1. No acute finding. 2. Remote left frontal parietal infarct.  IMPRESSION: Diffuse enhancement of the nerve roots of the cauda equina diagnostic of Guillain-Barre syndrome.  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   LUMBAR PUNCTURE  Date/Time: 05/28/2015 at 3:55 PM Performed by: Daryel November E  Consent: Verbal consent obtained. Written consent obtained. Risks and benefits: risks, benefits and alternatives were discussed Consent given by: Patient Patient understanding: patient states understanding of the procedure being performed  Patient consent: the  patient's understanding of the procedure matches consent given  Procedure consent: procedure consent matches procedure scheduled  Relevant documents: relevant documents present and verified  Test results: test results available and properly labeled Site marked: the operative site was marked Imaging studies: imaging studies available  Required items: required blood products, implants, devices, and special equipment available  Patient identity confirmed: verbally with patient and arm band  Time out: Immediately prior to procedure a "time out" was called to verify the correct patient, procedure, equipment, support staff and site/side marked as required.  Indications: A sending paralysis, areflexia, suspected Guillain-Barr  Anesthesia: local infiltration Local anesthetic: lidocaine 1% without epinephrine Anesthetic total: 5 ml Analgesia: Dilaudid  Preparation: Patient was prepped and draped in the usual sterile fashion. Lumbar space: L4-L5 interspace  Patient's position: Erect  Needle gauge: 22 Needle length: 3.5 in Number of attempts: 1 Fluid appearance: Clear Tubes of fluid: 4 Total volume: 5 ml Post-procedure: site cleaned and adhesive bandage applied Patient tolerance: Patient tolerated the procedure well with no immediate complications  CRITICAL CARE Performed by: Emily Filbert   Total critical care time: 30 minutes  Critical care time was exclusive of separately billable procedures and treating other patients.  Critical care was necessary to treat or prevent imminent or life-threatening deterioration.  Critical care was time spent personally by me on the following activities: development of treatment plan with patient and/or surrogate as well as nursing, discussions with consultants, evaluation of patient's response to treatment, examination of patient, obtaining history from patient or surrogate, ordering and performing treatments and interventions, ordering and review  of laboratory studies, ordering and review of radiographic studies, pulse oximetry and re-evaluation of patient's condition.  ____________________________________________  FINAL ASSESSMENT AND PLAN  Progressive weakness, paresthesias, lumbar puncture, Guillan-Barre syndrome  Plan: Patient with labs and imaging as dictated above. Patient may benefit from IV immunoglobulin. Discussed the case with neurology. Will start on immunoglobulin here. All also order an MRI of her T and LS spine. Patient is understanding of these things, we'll go ahead and have her admitted. Condition is guarded at this time.   Emily Filbert, MD   Emily Filbert, MD 05/28/15 1614  Emily Filbert, MD 05/28/15 Rickey Primus

## 2015-05-28 NOTE — Consult Note (Signed)
PULMONARY/CCM CONSULT NOTE  Requesting MD/Service: Gouru/Internal medicine Date of admission: 8/01 Date of consult: 8/01 Reason for consultation: GBS, concern for NM respiratory failure  Pt Profile:  22 F admitted via ED to Hospitalist service with dx of Guillian Barre Syndrome and concern for impending respiratory failure. Admitted to SDU for close monitoring   HPI:  74 F admitted via ED to Hospitalist service with dx of Guillian Barre Syndrome and concern for impending respiratory failure. Admitted to SDU for close monitoring. She presented with 5 days of progressive LE weakness, tingling of extremities and face and falls X 2 on the day of admission. She does describe mild, intermittent and transient dyspnea (like a brick on my chest) which was exacerbated by anxiety and pain. She underwent LP in the ED and CSF noted to have elevated protein. She has working diagnosis is GBS. Neurology following. 5 days of IVIG planned.   Past Medical History  Diagnosis Date  . Stroke     MEDICATIONS: reviewed  History   Social History  . Marital Status: Married    Spouse Name: N/A  . Number of Children: N/A  . Years of Education: N/A   Occupational History  . Not on file.   Social History Main Topics  . Smoking status: Current Every Day Smoker -- 2.00 packs/day    Types: Cigarettes  . Smokeless tobacco: Not on file  . Alcohol Use: No  . Drug Use: No  . Sexual Activity: Not on file   Other Topics Concern  . Not on file   Social History Narrative    No family history on file.  ROS - as per HPI. Otherwise negative. No diplopia, no dysphagia. No abdominal pain. No dysuria  Filed Vitals:   05/28/15 1243 05/28/15 1927  BP: 141/118 190/114  Pulse: 129 70  Temp: 98.1 F (36.7 C)   TempSrc: Oral   Resp: 16 16  Height:  (1.549 m)   Weight: 65.318 kg (144 lb)   SpO2: 97% 98%    EXAM:  Gen: WDWN, NAD HEENT: WNL Neck: No JVD Lungs: clear anteriorly and  posteriorly Cardiovascular: RRR s M Abdomen:NABS Ext: warm, no edem Neuro: 4/5 grip strength, absent patellar DTRs  DATA:  BMET    Component Value Date/Time   NA 134* 05/28/2015 1259   K 3.7 05/28/2015 1259   CL 102 05/28/2015 1259   CO2 22 05/28/2015 1259   GLUCOSE 114* 05/28/2015 1259   BUN 23* 05/28/2015 1259   CREATININE 0.69 05/28/2015 1259   CALCIUM 9.6 05/28/2015 1259   GFRNONAA >60 05/28/2015 1259   GFRAA >60 05/28/2015 1259    CBC    Component Value Date/Time   WBC 17.7* 05/28/2015 1259   RBC 6.02* 05/28/2015 1259   HGB 18.8* 05/28/2015 1259   HCT 54.9* 05/28/2015 1259   PLT 277 05/28/2015 1259   MCV 91.2 05/28/2015 1259   MCH 31.3 05/28/2015 1259   MCHC 34.3 05/28/2015 1259   RDW 12.9 05/28/2015 1259    No CXR  IMPRESSION:   Guillain Barr syndrome Heavy smoker without wheezing currently Risk of progressive respiratory failure. No indication for vent support presently UTI  PLAN/REC:  Monitor in ICU/SDU VC and NIF q 4 hrs for now  The absolute values are less important than the trends PCCM service will follow with you until risk of respiratory failure subsides CXR in AM 8/02  Billy Fischer, MD ; [facility] service Mobile [phone].  After 5:30 PM or weekends, call  [phone]       

## 2015-05-28 NOTE — ED Notes (Signed)
Pulmonologist at bedside

## 2015-05-28 NOTE — ED Notes (Signed)
Pt reports inability to take percocet and requested Norco instead. MD notified

## 2015-05-28 NOTE — H&P (Signed)
[facility] [place] at [facility]   PATIENT NAME: Nansi Birmingham    MR#:  [id number]  DATE OF BIRTH:  Nov 10, 1975  DATE OF ADMISSION:  05/28/2015  PRIMARY CARE PHYSICIAN: No PCP Per Patient   REQUESTING/REFERRING PHYSICIAN: Dr. Mayford Knife CHIEF COMPLAINT:   generalized weakness  HISTORY OF PRESENT ILLNESS:  Avonlea Boudreau  is a 39 y.o. female with a known history of CVA, chronic right-sided weakness is present into the ED with a chief complaint of generalized weakness. Patient had nausea and vomiting presently 5 days ago following which she started having lower extending to weakness which is progressively getting worse. Patient was also experiencing bilateral shoulder weakness and tingling and also has left shoulder pain.she's been seen here  several times and has been seen at [facility] for shoulder pain. Shoulder pain is been treated with Motrin which has helped the pain, but she describes as worsening paresthesias and lower extremity weakness.During my examination patient is reporting intermittent episodes of chest heaviness and perioral numbness. Deep tendon reflexes were absent in the lower extremities. ED physician has discussed with on-call neurologist Dr. Lonia Mad who has recommended LP, CSF is with greater than 250 of protein. Patient is started on IVIG  PAST MEDICAL HISTORY:   Past Medical History  Diagnosis Date  . Stroke     PAST SURGICAL HISTOIRY:   Past Surgical History  Procedure Laterality Date  . Cholecystectomy    . Finger surgery      SOCIAL HISTORY:   History  Substance Use Topics  . Smoking status: Current Every Day Smoker -- 2.00 packs/day    Types: Cigarettes  . Smokeless tobacco: Not on file  . Alcohol Use: No    FAMILY HISTORY:  No family history on file.  DRUG ALLERGIES:  No Known Allergies  REVIEW OF SYSTEMS:  CONSTITUTIONAL: No fever, fatigue .  reporting worsening of lower extremity weakness, now with upper extremity  weakness EYES: No blurred or double vision.  EARS, NOSE, AND THROAT: No tinnitus or ear pain.  RESPIRATORY: No cough, shortness of breath, wheezing or hemoptysis.  CARDIOVASCULAR: No chest pain, orthopnea, edema.  GASTROINTESTINAL: No nausea, vomiting, diarrhea or abdominal pain.  GENITOURINARY: No dysuria, hematuria.  ENDOCRINE: No polyuria, nocturia,  HEMATOLOGY: No anemia, easy bruising or bleeding SKIN: No rash or lesion. MUSCULOSKELETAL: No joint pain or arthritis.   NEUROLOGIC: reporting perioral numbness. Reporting worsening of lower extremity weakness, now with upper extremity weakness  PSYCHIATRY: No anxiety or depression.   MEDICATIONS AT HOME:   Prior to Admission medications   Medication Sig Start Date End Date Taking? Authorizing Provider  acetaminophen (TYLENOL) 500 MG tablet Take 1,000 mg by mouth every 6 (six) hours as needed for mild pain.   Yes Historical Provider, MD  Aspirin-Acetaminophen-Caffeine (PAMPRIN MAX PO) Take 2 tablets by mouth daily.   Yes Historical Provider, MD  ferrous sulfate 325 (65 FE) MG tablet Take 325 mg by mouth daily.   Yes Historical Provider, MD  ibuprofen (ADVIL,MOTRIN) 600 MG tablet Take 600 mg by mouth every 6 (six) hours as needed for mild pain.   Yes Historical Provider, MD  Multiple Vitamins-Minerals (MULTIVITAMIN PO) Take 1 tablet by mouth daily.   Yes Historical Provider, MD  ondansetron (ZOFRAN ODT) 4 MG disintegrating tablet Take 1 tablet (4 mg total) by mouth every 8 (eight) hours as needed for nausea or vomiting. 05/24/15  Yes Gayla Doss, MD  oxyCODONE-acetaminophen (PERCOCET/ROXICET) 5-325 MG per tablet Take 1 tablet by  mouth every 6 (six) hours as needed for severe pain. Patient not taking: Reported on 05/28/2015 05/22/15   Remington N Brown, MD  traMADol (ULTRAM) 50 MG tablet Take 1 tablet (50 mg total) by mouth every 8 (eight) hours as needed for moderate pain. Patient not taking: Reported on 05/28/2015 05/24/15 05/23/16  Eryka A  Gayle, MD      VITAL SIGNS:  Blood pressure 190/114, pulse 70, temperature 98.1 F (36.7 C), temperature source Oral, resp. rate 16, height 5\' 1"  (1.549 m), weight 65.318 kg (144 lb), last menstrual period 05/26/2015, SpO2 98 %.  PHYSICAL EXAMINATION:  GENERAL:  38 y.o.-year-old patient lying in the bed with no acute distress. Pale looking female EYES: Pupils equal, round, reactive to light and accommodation. No scleral icterus.  HEENT: Head atraumatic, normocephalic. Oropharynx and nasopharynx clear.  NECK:  Supple, no jugular venous distention. No thyroid enlargement, no tenderness.  LUNGS: Normal breath sounds bilaterally, no wheezing, rales,rhonchi or crepitation. No use of accessory muscles of respiration.  CARDIOVASCULAR: S1, S2 normal. No murmurs, rubs, or gallops.  ABDOMEN: Soft, nontender, nondistended. Bowel sounds present. No organomegaly or mass.  EXTREMITIES: No pedal edema, cyanosis, or clubbing.  NEUROLOGIC: Cranial nerves II through XII are intact. Muscle strength 3/5 in right upper extremity and lower extremity, 4 out of 5 in left upper and lower extremity. Decreased touch Sensation in lower extremities. Gait not checked. Deep tendon reflexes  are absent in lower extremities and PSYCHIATRIC: The patient is alert and oriented x 3.  SKIN: No obvious rash, lesion, or ulcer.   LABORATORY PANEL:   CBC  Recent Labs Lab 05/28/15 1259  WBC 17.7*  HGB 18.8*  HCT 54.9*  PLT 277   ------------------------------------------------------------------------------------------------------------------  Chemistries   Recent Labs Lab 05/24/15 1245 05/28/15 1259  NA 136 134*  K 3.6 3.7  CL 101 102  CO2 22 22  GLUCOSE 96 114*  BUN 18 23*  CREATININE 0.62 0.69  CALCIUM 9.2 9.6  AST 32  --   ALT 33  --   ALKPHOS 76  --   BILITOT 1.1  --    ------------------------------------------------------------------------------------------------------------------  Cardiac  Enzymes No results for input(s): TROPONINI in the last 168 hours. ------------------------------------------------------------------------------------------------------------------  RADIOLOGY:  Ct Head Wo Contrast  05/28/2015   CLINICAL DATA:  Generalized weakness and difficulty walking.  EXAM: CT HEAD WITHOUT CONTRAST  TECHNIQUE: Contiguous axial images were obtained from the base of the skull through the vertex without intravenous contrast.  COMPARISON:  None.  FINDINGS: Skull and Sinuses:Negative for fracture or destructive process. The mastoids, middle ears, and imaged paranasal sinuses are clear.  Prominent nasopharyngeal thickness without focal mass lesion on partial axial imaging.  Orbits: No acute abnormality.  Brain: Notable streak artifact at the level the ventral brain. No evidence of acute infarction, hemorrhage, hydrocephalus, or mass lesion/mass effect. Remote cortical and subcortical infarct with dense gliosis at the left frontal parietal junction.  IMPRESSION: 1. No acute finding. 2. Remote left frontal parietal infarct.   Electronically Signed   By: Jonathon  Watts M.D.   On: 05/28/2015 14:53   Mr Thoracic Spine W Wo Contrast  05/28/2015   CLINICAL DATA:  Bilateral arm and leg weakness beginning this morning with numbness and tingling. Unable to walk or move.  EXAM: MRI THORACIC AND LUMBAR SPINE WITHOUT AND WITH CONTRAST  TECHNIQUE: Multiplanar and multiecho pulse sequences of the thoracic and lumbar spine were obtained without and with intravenous contrast.  CONTRAST:  838mmL MULTIHANCE GADOBENATE DIMEGLUMINE  529 MG/ML IV SOLN  COMPARISON:  None.  FINDINGS: MR THORACIC SPINE FINDINGS  No significant spinal curvature. No degenerative disc disease, disc bulge or herniation. No stenosis of the spinal canal. No abnormal cord signal. Old minimal superior endplate deformity at T4. Benign appearing hemangiomas within the T5 and T10 vertebral bodies of no significance. Scout views in the cervical  region show spondylosis at C5-6 with mild to moderate canal encroachment. After contrast administration, there is no abnormal enhancement of the cord itself but there is enhancement of the nerve roots of the cauda equina.  MR LUMBAR SPINE FINDINGS  This study also shows diffuse enhancement of the nerve roots of the cauda equina. This is typical and diagnostic of Guillain-Barre syndrome  There is no disc abnormality at L3-4 or above. At L4-5, the disc is degenerated and there is a shallow broad-based disc herniation slightly more prominent towards the right with mild narrowing of the right lateral recess. At L5-S1, there is a broad-based disc herniation that contacts the S1 nerve roots but does not cause definite nerve compression.  IMPRESSION: Diffuse enhancement of the nerve roots of the cauda equina diagnostic of Guillain-Barre syndrome.  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   Electronically Signed   By: Paulina Fusi M.D.   On: 05/28/2015 18:15   Mr Lumbar Spine W Wo Contrast  05/28/2015   CLINICAL DATA:  Bilateral arm and leg weakness beginning this morning with numbness and tingling. Unable to walk or move.  EXAM: MRI THORACIC AND LUMBAR SPINE WITHOUT AND WITH CONTRAST  TECHNIQUE: Multiplanar and multiecho pulse sequences of the thoracic and lumbar spine were obtained without and with intravenous contrast.  CONTRAST:  13mL MULTIHANCE GADOBENATE DIMEGLUMINE 529 MG/ML IV SOLN  COMPARISON:  None.  FINDINGS: MR THORACIC SPINE FINDINGS  No significant spinal curvature. No degenerative disc disease, disc bulge or herniation. No stenosis of the spinal canal. No abnormal cord signal. Old minimal superior endplate deformity at T4. Benign appearing hemangiomas within the T5 and T10 vertebral bodies of no significance. Scout views in the cervical region show spondylosis at C5-6 with mild to moderate canal encroachment. After contrast administration,  there is no abnormal enhancement of the cord itself but there is enhancement of the nerve roots of the cauda equina.  MR LUMBAR SPINE FINDINGS  This study also shows diffuse enhancement of the nerve roots of the cauda equina. This is typical and diagnostic of Guillain-Barre syndrome  There is no disc abnormality at L3-4 or above. At L4-5, the disc is degenerated and there is a shallow broad-based disc herniation slightly more prominent towards the right with mild narrowing of the right lateral recess. At L5-S1, there is a broad-based disc herniation that contacts the S1 nerve roots but does not cause definite nerve compression.  IMPRESSION: Diffuse enhancement of the nerve roots of the cauda equina diagnostic of Guillain-Barre syndrome.  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   Electronically Signed   By: Paulina Fusi M.D.   On: 05/28/2015 18:15    EKG:   Orders placed or performed during the hospital encounter of 05/28/15  . ED EKG  . ED EKG    IMPRESSION AND PLAN:   Dylana Kettlewell  is a 39 y.o. female with a known history of CVA, chronic right-sided weakness is present into the ED with a chief complaint of generalized weakness. Patient had nausea  and vomiting presently 5 days ago following which she started having lower extending to weakness which is progressively getting worse. Patient was also experiencing bilateral shoulder weakness and tingling and also has left shoulder pain.she's been seen here  several times and has been seen at [facility] for shoulder pain. Shoulder pain is been treated with Motrin which has helped the pain, but she describes as worsening paresthesias and lower extremity weakness.During my examination patient is reporting intermittent episodes of chest heaviness and perioral numbness. Deep tendon reflexes were absent in the lower extremities. ED physician has discussed with on-call neurologist Dr. Lonia Mad who has  recommended LP, CSF is with greater than 250 of protein  1. 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 Not considering plasmapheresis at this time Neuro checks every 4 hours Check vital capacity and NIF every 4 hours, if needed patient will be intubated prophylactically Follow-up on the MRI of the thoracolumbar spine Consult is placed to neurology and pulmonology-discussed with them  2. Left shoulder pain following a trauma Provide pain management as needed basis  3 history of CVA with chronic right-sided residual weakness No interventions needed at this time  4. Acute cystitis Follow-up on the urine culture and sensitivity IV Rocephin   5.Nicotine dependence Nicotine cessation counseling was provided Provide her nicotine patch.    All the records are reviewed and case discussed with ED provider. Management plans discussed with the patient, family and they are in agreement.  CODE STATUS: Full code, husband is the healthcare power of attorney  TOTAL CRITICAL CARE TIME TAKING CARE OF THIS PATIENT: Reviewing medical records, history and physical, admission orders and coordination of care-50 minutes.    Ramonita Lab M.D on 05/28/2015 at 8:00 PM  Between 7am to 6pm - Pager - [phone]  After 6pm go to [email or web address] - password [email or web address]  [place] [facility]  Office  [phone]  CC: Primary care physician; No PCP Per Patient

## 2015-05-28 NOTE — ED Notes (Signed)
Pt reports starting Wednesday sudden onset of progressive weakness causing falls and pain in right shoulder and bilateral legs. Pt reports at this point being unable to bear weight on legs and inability to walk at this time.

## 2015-05-28 NOTE — ED Notes (Addendum)
Pt here with c/o generalized weakness, reports difficulty walking and reports arms and legs are "asleep", pt reports fallen twice today, reports entire face is numb. Husband reports pt with hx of stroke. Pt reports symptoms started last Wednesday. (7/27)

## 2015-05-29 DIAGNOSIS — G610 Guillain-Barre syndrome: Principal | ICD-10-CM

## 2015-05-29 LAB — COMPREHENSIVE METABOLIC PANEL
ALK PHOS: 67 U/L (ref 38–126)
ALT: 13 U/L — AB (ref 14–54)
AST: 15 U/L (ref 15–41)
Albumin: 3.7 g/dL (ref 3.5–5.0)
Anion gap: 8 (ref 5–15)
BILIRUBIN TOTAL: 0.9 mg/dL (ref 0.3–1.2)
BUN: 13 mg/dL (ref 6–20)
CALCIUM: 8.8 mg/dL — AB (ref 8.9–10.3)
CO2: 23 mmol/L (ref 22–32)
CREATININE: 0.44 mg/dL (ref 0.44–1.00)
Chloride: 104 mmol/L (ref 101–111)
GFR calc Af Amer: >60 mL/min (ref >60)
GFR calc non Af Amer: >60 mL/min (ref >60)
GLUCOSE: 103 mg/dL — AB (ref 65–99)
POTASSIUM: 3.3 mmol/L — AB (ref 3.5–5.1)
Sodium: 135 mmol/L (ref 135–145)
TOTAL PROTEIN: 7.6 g/dL (ref 6.5–8.1)

## 2015-05-29 LAB — CBC
HCT: 49.9 % — ABNORMAL HIGH (ref 35.0–47.0)
HEMOGLOBIN: 17.2 g/dL — AB (ref 12.0–16.0)
MCH: 31.5 pg (ref 26.0–34.0)
MCHC: 34.4 g/dL (ref 32.0–36.0)
MCV: 91.4 fL (ref 80.0–100.0)
PLATELETS: 199 10*3/uL (ref 150–440)
RBC: 5.45 MIL/uL — AB (ref 3.80–5.20)
RDW: 12.8 % (ref 11.5–14.5)
WBC: 8.9 10*3/uL (ref 3.6–11.0)

## 2015-05-29 LAB — MRSA PCR SCREENING: MRSA BY PCR: NEGATIVE

## 2015-05-29 LAB — TSH: TSH: 2.738 u[IU]/mL (ref 0.350–4.500)

## 2015-05-29 MED ORDER — POTASSIUM CHLORIDE CRYS ER 20 MEQ PO TBCR
40.0000 meq | EXTENDED_RELEASE_TABLET | Freq: Once | ORAL | Status: AC
  Administered 2015-05-29: 40 meq via ORAL
  Filled 2015-05-29: qty 2

## 2015-05-29 MED ORDER — HYDRALAZINE HCL 25 MG PO TABS
25.0000 mg | ORAL_TABLET | Freq: Three times a day (TID) | ORAL | Status: DC
  Administered 2015-05-29 – 2015-06-05 (×15): 25 mg via ORAL
  Filled 2015-05-29 (×17): qty 1

## 2015-05-29 NOTE — Progress Notes (Signed)
Patient started on hydralazine due to elevated blood pressure

## 2015-05-29 NOTE — Consult Note (Signed)
CC: weakness in lower extremities   HPI: Alexandria Murray is an 39 y.o. female  with a known history of stroke, chronic right-sided weakness is present into the ED with a chief complaint of generalized weakness that started a week prior and has been progressing. No prior history of similar symptoms. Concern for GBS. Pt is s/p LP with clear cytoalbuminological dissociation (elevated protein with normal cell count). MRI C and T spine no signs of transverse myelitis and inflamed roots.   Past Medical History  Diagnosis Date  . Stroke     Past Surgical History  Procedure Laterality Date  . Cholecystectomy    . Finger surgery      No family history on file.  Social History:  reports that she has been smoking Cigarettes.  She has been smoking about 2.00 packs per day. She does not have any smokeless tobacco history on file. She reports that she does not drink alcohol or use illicit drugs.  No Known Allergies  Medications: I have reviewed the patient's current medications.  ROS: History obtained from the patient  General ROS: negative for - chills, fatigue, fever, night sweats, weight gain or weight loss Psychological ROS: negative for - behavioral disorder, hallucinations, memory difficulties, mood swings or suicidal ideation Ophthalmic ROS: negative for - blurry vision, double vision, eye pain or loss of vision ENT ROS: negative for - epistaxis, nasal discharge, oral lesions, sore throat, tinnitus or vertigo Allergy and Immunology ROS: negative for - hives or itchy/watery eyes Hematological and Lymphatic ROS: negative for - bleeding problems, bruising or swollen lymph nodes Endocrine ROS: negative for - galactorrhea, hair pattern changes, polydipsia/polyuria or temperature intolerance Respiratory ROS: negative for - cough, hemoptysis, shortness of breath or wheezing Cardiovascular ROS: negative for - chest pain, dyspnea on exertion, edema or irregular heartbeat Gastrointestinal ROS:  negative for - abdominal pain, diarrhea, hematemesis, nausea/vomiting or stool incontinence Genito-Urinary ROS: negative for - dysuria, hematuria, incontinence or urinary frequency/urgency Musculoskeletal ROS: negative for - joint swelling or muscular weakness Neurological ROS: as noted in HPI Dermatological ROS: negative for rash and skin lesion changes  Physical Examination: Blood pressure 162/101, pulse 104, temperature 98.5 F (36.9 C), temperature source Oral, resp. rate 16, height  (1.549 m), weight 65.318 kg (144 lb), last menstrual period 05/26/2015, SpO2 100 %.    Neurological Examination Mental Status: Alert, oriented, thought content appropriate.  Speech fluent without evidence of aphasia.  Able to follow 3 step commands without difficulty. Cranial Nerves: II: Discs flat bilaterally; Visual fields grossly normal, pupils equal, round, reactive to light and accommodation III,IV, VI: ptosis not present, extra-ocular motions intact bilaterally V,VII: smile symmetric, facial light touch sensation normal bilaterally VIII: hearing normal bilaterally IX,X: gag reflex present XI: bilateral shoulder shrug XII: midline tongue extension Motor: Right : Upper extremity   4+/5    Left:     Upper extremity   4+/5  Lower extremity   4/5     Lower extremity   4/5 Tone and bulk:normal tone throughout; no atrophy noted Sensory: Pinprick and light touch intact throughout, bilaterally Deep Tendon Reflexes: absent reflexes in there lower extremities Plantars: Right: downgoing   Left: downgoing Cerebellar: normal finger-to-nose, normal rapid alternating movements and normal heel-to-shin test Gait: not tested.        Laboratory Studies:   Basic Metabolic Panel:  Recent Labs Lab 05/24/15 1245 05/28/15 1259 05/28/15 2211 05/29/15 0612  NA 136 134*  --  135  K 3.6 3.7  --  3.3*  CL 101 102  --  104  CO2 22 22  --  23  GLUCOSE 96 114*  --  103*  BUN 18 23*  --  13  CREATININE  0.62 0.69 0.56 0.44  CALCIUM 9.2 9.6  --  8.8*    Liver Function Tests:  Recent Labs Lab 05/24/15 1245 05/29/15 0612  AST 32 15  ALT 33 13*  ALKPHOS 76 67  BILITOT 1.1 0.9  PROT 7.7 7.6  ALBUMIN 4.3 3.7    Recent Labs Lab 05/24/15 1245  LIPASE 28   No results for input(s): AMMONIA in the last 168 hours.  CBC:  Recent Labs Lab 05/24/15 1245 05/28/15 1259 05/28/15 2211 05/29/15 0612  WBC 9.7 17.7* 12.7* 8.9  HGB 17.4* 18.8* 17.1* 17.2*  HCT 51.5* 54.9* 50.9* 49.9*  MCV 91.5 91.2 91.9 91.4  PLT 241 277 224 199    Cardiac Enzymes:  Recent Labs Lab 05/28/15 2211  CKTOTAL 53    BNP: Invalid input(s): POCBNP  CBG:  Recent Labs Lab 05/28/15 2101  GLUCAP 107*    Microbiology: Results for orders placed or performed during the hospital encounter of 05/28/15  CSF culture     Status: None (Preliminary result)   Collection Time: 05/28/15  3:48 PM  Result Value Ref Range Status   Specimen Description CSF  Final   Special Requests Normal  Final   Gram Stain NO BACTERIA SEEN RARE WBC CONFIRMED BY TFK   Final   Culture PENDING  Incomplete   Report Status PENDING  Incomplete  MRSA PCR Screening     Status: None   Collection Time: 05/28/15  9:30 PM  Result Value Ref Range Status   MRSA by PCR NEGATIVE NEGATIVE Final    Comment:        The GeneXpert MRSA Assay (FDA approved for NASAL specimens only), is one component of a comprehensive MRSA colonization surveillance program. It is not intended to diagnose MRSA infection nor to guide or monitor treatment for MRSA infections.     Coagulation Studies: No results for input(s): LABPROT, INR in the last 72 hours.  Urinalysis:  Recent Labs Lab 05/28/15 0325  COLORURINE AMBER*  LABSPEC 1.031*  PHURINE 5.0  GLUCOSEU NEGATIVE  HGBUR 3+*  BILIRUBINUR NEGATIVE  KETONESUR 1+*  PROTEINUR >500*  NITRITE NEGATIVE  LEUKOCYTESUR TRACE*    Lipid Panel:  No results found for: CHOL, TRIG, HDL,  CHOLHDL, VLDL, LDLCALC  HgbA1C: No results found for: HGBA1C  Urine Drug Screen:  No results found for: LABOPIA, COCAINSCRNUR, LABBENZ, AMPHETMU, THCU, LABBARB  Alcohol Level: No results for input(s): ETH in the last 168 hours.  Other results: EKG: normal EKG, normal sinus rhythm, unchanged from previous tracings.  Imaging: Ct Head Wo Contrast  05/28/2015   CLINICAL DATA:  Generalized weakness and difficulty walking.  EXAM: CT HEAD WITHOUT CONTRAST  TECHNIQUE: Contiguous axial images were obtained from the base of the skull through the vertex without intravenous contrast.  COMPARISON:  None.  FINDINGS: Skull and Sinuses:Negative for fracture or destructive process. The mastoids, middle ears, and imaged paranasal sinuses are clear.  Prominent nasopharyngeal thickness without focal mass lesion on partial axial imaging.  Orbits: No acute abnormality.  Brain: Notable streak artifact at the level the ventral brain. No evidence of acute infarction, hemorrhage, hydrocephalus, or mass lesion/mass effect. Remote cortical and subcortical infarct with dense gliosis at the left frontal parietal junction.  IMPRESSION: 1. No acute finding. 2. Remote left frontal parietal infarct.  Electronically Signed   By: Marnee Spring M.D.   On: 05/28/2015 14:53   Mr Thoracic Spine W Wo Contrast  05/28/2015   CLINICAL DATA:  Bilateral arm and leg weakness beginning this morning with numbness and tingling. Unable to walk or move.  EXAM: MRI THORACIC AND LUMBAR SPINE WITHOUT AND WITH CONTRAST  TECHNIQUE: Multiplanar and multiecho pulse sequences of the thoracic and lumbar spine were obtained without and with intravenous contrast.  CONTRAST:  13mL MULTIHANCE GADOBENATE DIMEGLUMINE 529 MG/ML IV SOLN  COMPARISON:  None.  FINDINGS: MR THORACIC SPINE FINDINGS  No significant spinal curvature. No degenerative disc disease, disc bulge or herniation. No stenosis of the spinal canal. No abnormal cord signal. Old minimal superior endplate  deformity at T4. Benign appearing hemangiomas within the T5 and T10 vertebral bodies of no significance. Scout views in the cervical region show spondylosis at C5-6 with mild to moderate canal encroachment. After contrast administration, there is no abnormal enhancement of the cord itself but there is enhancement of the nerve roots of the cauda equina.  MR LUMBAR SPINE FINDINGS  This study also shows diffuse enhancement of the nerve roots of the cauda equina. This is typical and diagnostic of Guillain-Barre syndrome  There is no disc abnormality at L3-4 or above. At L4-5, the disc is degenerated and there is a shallow broad-based disc herniation slightly more prominent towards the right with mild narrowing of the right lateral recess. At L5-S1, there is a broad-based disc herniation that contacts the S1 nerve roots but does not cause definite nerve compression.  IMPRESSION: Diffuse enhancement of the nerve roots of the cauda equina diagnostic of Guillain-Barre syndrome.  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   Electronically Signed   By: Paulina Fusi M.D.   On: 05/28/2015 18:15   Mr Lumbar Spine W Wo Contrast  05/28/2015   CLINICAL DATA:  Bilateral arm and leg weakness beginning this morning with numbness and tingling. Unable to walk or move.  EXAM: MRI THORACIC AND LUMBAR SPINE WITHOUT AND WITH CONTRAST  TECHNIQUE: Multiplanar and multiecho pulse sequences of the thoracic and lumbar spine were obtained without and with intravenous contrast.  CONTRAST:  13mL MULTIHANCE GADOBENATE DIMEGLUMINE 529 MG/ML IV SOLN  COMPARISON:  None.  FINDINGS: MR THORACIC SPINE FINDINGS  No significant spinal curvature. No degenerative disc disease, disc bulge or herniation. No stenosis of the spinal canal. No abnormal cord signal. Old minimal superior endplate deformity at T4. Benign appearing hemangiomas within the T5 and T10 vertebral bodies of no significance.  Scout views in the cervical region show spondylosis at C5-6 with mild to moderate canal encroachment. After contrast administration, there is no abnormal enhancement of the cord itself but there is enhancement of the nerve roots of the cauda equina.  MR LUMBAR SPINE FINDINGS  This study also shows diffuse enhancement of the nerve roots of the cauda equina. This is typical and diagnostic of Guillain-Barre syndrome  There is no disc abnormality at L3-4 or above. At L4-5, the disc is degenerated and there is a shallow broad-based disc herniation slightly more prominent towards the right with mild narrowing of the right lateral recess. At L5-S1, there is a broad-based disc herniation that contacts the S1 nerve roots but does not cause definite nerve compression.  IMPRESSION: Diffuse enhancement of the nerve roots of the cauda equina diagnostic of Guillain-Barre syndrome.  Degenerative disc disease at L4-5 and L5-S1 with disc herniations, presumably  incidental in this setting.   Electronically Signed   By: Paulina Fusi M.D.   On: 05/28/2015 18:15     Assessment/Plan: 39 y.o. female  with a known history of stroke, chronic right-sided weakness is present into the ED with a chief complaint of generalized weakness that started a week prior and has been progressing. No prior history of similar symptoms. Concern for GBS. Pt is s/p LP with clear cytoalbuminological dissociation (elevated protein with normal cell count). MRI C and T spine no signs of transverse myelitis and inflamed roots.   GBS diagnosis - IVIG .4mg /kg x 5 days - Pt/ot - Niff and VC q4. Latest Niff is -40 and pt is on room air saturating at 100%. Since not progressive would consider swtichinig to q8 hrs followed by q12.    05/29/2015, 9:47 AM

## 2015-05-29 NOTE — Progress Notes (Signed)
[facility] - [place] at [facility]   PATIENT NAME: Alexandria Murray    MR#:  [id number]  DATE OF BIRTH:  1975/11/15  SUBJECTIVE:seen at bedside.admitted GB syndrome.on IV IG ,pt mstill feels heavy in legs,righ facial numbness.her NIF is -35 cm.  CHIEF COMPLAINT:   Chief Complaint  Patient presents with  . Weakness    REVIEW OF SYSTEMS:    Review of Systems  Constitutional: Negative for fever and chills.  HENT: Negative for hearing loss.   Eyes: Negative for blurred vision, double vision and photophobia.  Respiratory: Negative for cough, hemoptysis and shortness of breath.   Cardiovascular: Negative for palpitations, orthopnea and leg swelling.  Gastrointestinal: Negative for vomiting, abdominal pain and diarrhea.  Genitourinary: Negative for dysuria and urgency.  Musculoskeletal: Negative for myalgias and neck pain.  Skin: Negative for rash.  Neurological: Positive for sensory change and focal weakness. Negative for dizziness, seizures, weakness and headaches.  Psychiatric/Behavioral: Negative for memory loss. The patient does not have insomnia.     Nutrition:  Tolerating Diet: Tolerating PT:      DRUG ALLERGIES:  No Known Allergies  VITALS:  Blood pressure 126/87, pulse 89, temperature 98.1 F (36.7 C), temperature source Oral, resp. rate 15, height 5\' 1"  (1.549 m), weight 65.318 kg (144 lb), last menstrual period 05/26/2015, SpO2 98 %.  PHYSICAL EXAMINATION:   Physical Exam  Constitutional: She is oriented to person, place, and time.  Neurological: She is alert and oriented to person, place, and time. No cranial nerve deficit.  Decreased power in both legs    GENERAL:  39 y.o.-year-old patient lying in the bed with no acute distress.  EYES: Pupils equal, round, reactive to light and accommodation. No scleral icterus. Extraocular muscles intact.  HEENT: Head atraumatic, normocephalic. Oropharynx and nasopharynx clear.  NECK:  Supple, no  jugular venous distention. No thyroid enlargement, no tenderness.  LUNGS: Normal breath sounds bilaterally, no wheezing, rales,rhonchi or crepitation. No use of accessory muscles of respiration.  CARDIOVASCULAR: S1, S2 normal. No murmurs, rubs, or gallops.  ABDOMEN: Soft, nontender, nondistended. Bowel sounds present. No organomegaly or mass.  EXTREMITIES: No pedal edema, cyanosis, or clubbing.  NEUROLOGIC: Cranial nerves II through XII are intact. Muscle strength 5/5 in all extremities. Sensation intact. Gait not checked.  PSYCHIATRIC: The patient is alert and oriented x 3.  SKIN: No obvious rash, lesion, or ulcer.    LABORATORY PANEL:   CBC  Recent Labs Lab 05/29/15 0612  WBC 8.9  HGB 17.2*  HCT 49.9*  PLT 199   ------------------------------------------------------------------------------------------------------------------  Chemistries   Recent Labs Lab 05/29/15 0612  NA 135  K 3.3*  CL 104  CO2 23  GLUCOSE 103*  BUN 13  CREATININE 0.44  CALCIUM 8.8*  AST 15  ALT 13*  ALKPHOS 67  BILITOT 0.9   ------------------------------------------------------------------------------------------------------------------  Cardiac Enzymes No results for input(s): TROPONINI in the last 168 hours. ------------------------------------------------------------------------------------------------------------------  RADIOLOGY:  Ct Head Wo Contrast  05/28/2015   CLINICAL DATA:  Generalized weakness and difficulty walking.  EXAM: CT HEAD WITHOUT CONTRAST  TECHNIQUE: Contiguous axial images were obtained from the base of the skull through the vertex without intravenous contrast.  COMPARISON:  None.  FINDINGS: Skull and Sinuses:Negative for fracture or destructive process. The mastoids, middle ears, and imaged paranasal sinuses are clear.  Prominent nasopharyngeal thickness without focal mass lesion on partial axial imaging.  Orbits: No acute abnormality.  Brain: Notable streak artifact at  the level the ventral brain. No  evidence of acute infarction, hemorrhage, hydrocephalus, or mass lesion/mass effect. Remote cortical and subcortical infarct with dense gliosis at the left frontal parietal junction.  IMPRESSION: 1. No acute finding. 2. Remote left frontal parietal infarct.   Electronically Signed   By: Marnee Spring M.D.   On: 05/28/2015 14:53   Mr Thoracic Spine W Wo Contrast  05/28/2015   CLINICAL DATA:  Bilateral arm and leg weakness beginning this morning with numbness and tingling. Unable to walk or move.  EXAM: MRI THORACIC AND LUMBAR SPINE WITHOUT AND WITH CONTRAST  TECHNIQUE: Multiplanar and multiecho pulse sequences of the thoracic and lumbar spine were obtained without and with intravenous contrast.  CONTRAST:  13mL MULTIHANCE GADOBENATE DIMEGLUMINE 529 MG/ML IV SOLN  COMPARISON:  None.  FINDINGS: MR THORACIC SPINE FINDINGS  No significant spinal curvature. No degenerative disc disease, disc bulge or herniation. No stenosis of the spinal canal. No abnormal cord signal. Old minimal superior endplate deformity at T4. Benign appearing hemangiomas within the T5 and T10 vertebral bodies of no significance. Scout views in the cervical region show spondylosis at C5-6 with mild to moderate canal encroachment. After contrast administration, there is no abnormal enhancement of the cord itself but there is enhancement of the nerve roots of the cauda equina.  MR LUMBAR SPINE FINDINGS  This study also shows diffuse enhancement of the nerve roots of the cauda equina. This is typical and diagnostic of Guillain-Barre syndrome  There is no disc abnormality at L3-4 or above. At L4-5, the disc is degenerated and there is a shallow broad-based disc herniation slightly more prominent towards the right with mild narrowing of the right lateral recess. At L5-S1, there is a broad-based disc herniation that contacts the S1 nerve roots but does not cause definite nerve compression.  IMPRESSION: Diffuse  enhancement of the nerve roots of the cauda equina diagnostic of Guillain-Barre syndrome.  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   Electronically Signed   By: Paulina Fusi M.D.   On: 05/28/2015 18:15   Mr Lumbar Spine W Wo Contrast  05/28/2015   CLINICAL DATA:  Bilateral arm and leg weakness beginning this morning with numbness and tingling. Unable to walk or move.  EXAM: MRI THORACIC AND LUMBAR SPINE WITHOUT AND WITH CONTRAST  TECHNIQUE: Multiplanar and multiecho pulse sequences of the thoracic and lumbar spine were obtained without and with intravenous contrast.  CONTRAST:  13mL MULTIHANCE GADOBENATE DIMEGLUMINE 529 MG/ML IV SOLN  COMPARISON:  None.  FINDINGS: MR THORACIC SPINE FINDINGS  No significant spinal curvature. No degenerative disc disease, disc bulge or herniation. No stenosis of the spinal canal. No abnormal cord signal. Old minimal superior endplate deformity at T4. Benign appearing hemangiomas within the T5 and T10 vertebral bodies of no significance. Scout views in the cervical region show spondylosis at C5-6 with mild to moderate canal encroachment. After contrast administration, there is no abnormal enhancement of the cord itself but there is enhancement of the nerve roots of the cauda equina.  MR LUMBAR SPINE FINDINGS  This study also shows diffuse enhancement of the nerve roots of the cauda equina. This is typical and diagnostic of Guillain-Barre syndrome  There is no disc abnormality at L3-4 or above. At L4-5, the disc is degenerated and there is a shallow broad-based disc herniation slightly more prominent towards the right with mild narrowing of the right lateral recess. At L5-S1, there is a broad-based disc herniation that contacts the S1  nerve roots but does not cause definite nerve compression.  IMPRESSION: Diffuse enhancement of the nerve roots of the cauda equina diagnostic of Guillain-Barre syndrome.  Degenerative disc  disease at L4-5 and L5-S1 with disc herniations, presumably incidental in this setting.   Electronically Signed   By: Paulina Fusi M.D.   On: 05/28/2015 18:15     ASSESSMENT AND PLAN:   Active Problems:   Guillain Barr syndrome  GB syndrome;needs IV IG for 5 days,d/w neuro,check vials,NIF every 4hrs, Start diet, 2.cystitis Oncology IV rocpehi,follow cultures Copd;no wheezing,h/o tobacco abuse 4.htn;controlled Monitor in CCU step down for possible respiratory depression related to GB sndrome     All the records are reviewed and case discussed with Care Management/Social Workerr. Management plans discussed with the patient, family and they are in agreement.  CODE STATUS:full  TOTAL TIME TAKING CARE OF THIS PATIENT: 35 min   POSSIBLE D/C IN1-2 DAYS, DEPENDING ON CLINICAL CONDITION.   Katha Hamming M.D on 05/29/2015 at 2:45 PM  Between 7am to 6pm - Pager - [phone]  After 6pm go to [email or web address] - password [email or web address] [facility]  [facility]  Office  [phone]  CC: Primary care physician; No PCP Per Patient

## 2015-05-29 NOTE — Progress Notes (Signed)
Patient seen for NIF maneuver. Patients O2 sat 100% on room air and in no distress. Patient achieved -35/-40 cm H2O and tolerated well.

## 2015-05-29 NOTE — Progress Notes (Signed)
NIF -40 

## 2015-05-29 NOTE — Progress Notes (Signed)
NIF >-40 cmh2o  

## 2015-05-30 DIAGNOSIS — F17210 Nicotine dependence, cigarettes, uncomplicated: Secondary | ICD-10-CM

## 2015-05-30 LAB — URINE CULTURE

## 2015-05-30 LAB — GLUCOSE, CAPILLARY: GLUCOSE-CAPILLARY: 113 mg/dL — AB (ref 65–99)

## 2015-05-30 MED ORDER — DEXTROSE 5 % IV SOLN
1.0000 g | INTRAVENOUS | Status: AC
  Administered 2015-05-30 – 2015-05-31 (×2): 1 g via INTRAVENOUS
  Filled 2015-05-30 (×3): qty 10

## 2015-05-30 MED ORDER — ADULT MULTIVITAMIN W/MINERALS CH
1.0000 | ORAL_TABLET | Freq: Every day | ORAL | Status: DC
  Administered 2015-06-02 – 2015-06-05 (×4): 1 via ORAL
  Filled 2015-05-30 (×4): qty 1

## 2015-05-30 NOTE — Progress Notes (Signed)
NIF -40 

## 2015-05-30 NOTE — Consult Note (Signed)
CC: weakness in lower extremities   HPI: Alexandria Murray is an 39 y.o. female  with a known history of stroke, chronic right-sided weakness is present into the ED with a chief complaint of generalized weakness that started a week prior and has been progressing. No prior history of similar symptoms. Concern for GBS. Pt is s/p LP with clear cytoalbuminological dissociation (elevated protein with normal cell count). MRI C and T spine no signs of transverse myelitis and inflamed roots.   Niff at -40  Past Medical History  Diagnosis Date  . Stroke     Past Surgical History  Procedure Laterality Date  . Cholecystectomy    . Finger surgery      No family history on file.  Social History:  reports that she has been smoking Cigarettes.  She has been smoking about 2.00 packs per day. She does not have any smokeless tobacco history on file. She reports that she does not drink alcohol or use illicit drugs.  No Known Allergies  Medications: I have reviewed the patient's current medications.  ROS: History obtained from the patient  General ROS: negative for - chills, fatigue, fever, night sweats, weight gain or weight loss Psychological ROS: negative for - behavioral disorder, hallucinations, memory difficulties, mood swings or suicidal ideation Ophthalmic ROS: negative for - blurry vision, double vision, eye pain or loss of vision ENT ROS: negative for - epistaxis, nasal discharge, oral lesions, sore throat, tinnitus or vertigo Allergy and Immunology ROS: negative for - hives or itchy/watery eyes Hematological and Lymphatic ROS: negative for - bleeding problems, bruising or swollen lymph nodes Endocrine ROS: negative for - galactorrhea, hair pattern changes, polydipsia/polyuria or temperature intolerance Respiratory ROS: negative for - cough, hemoptysis, shortness of breath or wheezing Cardiovascular ROS: negative for - chest pain, dyspnea on exertion, edema or irregular  heartbeat Gastrointestinal ROS: negative for - abdominal pain, diarrhea, hematemesis, nausea/vomiting or stool incontinence Genito-Urinary ROS: negative for - dysuria, hematuria, incontinence or urinary frequency/urgency Musculoskeletal ROS: negative for - joint swelling or muscular weakness Neurological ROS: as noted in HPI Dermatological ROS: negative for rash and skin lesion changes  Physical Examination: Blood pressure 108/73, pulse 111, temperature 98.6 F (37 C), temperature source Oral, resp. rate 12, height 5\' 1"  (1.549 m), weight 65.318 kg (144 lb), last menstrual period 05/26/2015, SpO2 100 %.    Neurological Examination Mental Status: Alert, oriented, thought content appropriate.  Speech fluent without evidence of aphasia.  Able to follow 3 step commands without difficulty. Cranial Nerves: II: Discs flat bilaterally; Visual fields grossly normal, pupils equal, round, reactive to light and accommodation III,IV, VI: ptosis not present, extra-ocular motions intact bilaterally V,VII: smile symmetric, facial light touch sensation normal bilaterally VIII: hearing normal bilaterally IX,X: gag reflex present XI: bilateral shoulder shrug XII: midline tongue extension Motor: Right : Upper extremity   4+/5    Left:     Upper extremity  4+/5  Lower extremity   4/5     Lower extremity   4/5 Tone and bulk:normal tone throughout; no atrophy noted Sensory: Pinprick and light touch intact throughout, bilaterally Deep Tendon Reflexes: absent reflexes in there lower extremities Plantars: Right: downgoing   Left: downgoing Cerebellar: normal finger-to-nose, normal rapid alternating movements and normal heel-to-shin test Gait: not tested.        Laboratory Studies:   Basic Metabolic Panel:  Recent Labs Lab 05/24/15 1245 05/28/15 1259 05/28/15 2211 05/29/15 0612  NA 136 134*  --  135  K  3.6 3.7  --  3.3*  CL 101 102  --  104  CO2 22 22  --  23  GLUCOSE 96 114*  --  103*  BUN  18 23*  --  13  CREATININE 0.62 0.69 0.56 0.44  CALCIUM 9.2 9.6  --  8.8*    Liver Function Tests:  Recent Labs Lab 05/24/15 1245 05/29/15 0612  AST 32 15  ALT 33 13*  ALKPHOS 76 67  BILITOT 1.1 0.9  PROT 7.7 7.6  ALBUMIN 4.3 3.7    Recent Labs Lab 05/24/15 1245  LIPASE 28   No results for input(s): AMMONIA in the last 168 hours.  CBC:  Recent Labs Lab 05/24/15 1245 05/28/15 1259 05/28/15 2211 05/29/15 0612  WBC 9.7 17.7* 12.7* 8.9  HGB 17.4* 18.8* 17.1* 17.2*  HCT 51.5* 54.9* 50.9* 49.9*  MCV 91.5 91.2 91.9 91.4  PLT 241 277 224 199    Cardiac Enzymes:  Recent Labs Lab 05/28/15 2211  CKTOTAL 53    BNP: Invalid input(s): POCBNP  CBG:  Recent Labs Lab 05/28/15 2101  GLUCAP 107*    Microbiology: Results for orders placed or performed during the hospital encounter of 05/28/15  Urine culture     Status: None   Collection Time: 05/28/15  3:25 AM  Result Value Ref Range Status   Specimen Description URINE, CLEAN CATCH  Final   Special Requests NONE  Final   Culture MULTIPLE SPECIES PRESENT, SUGGEST RECOLLECTION  Final   Report Status 05/30/2015 FINAL  Final  CSF culture     Status: None (Preliminary result)   Collection Time: 05/28/15  3:48 PM  Result Value Ref Range Status   Specimen Description CSF  Final   Special Requests Normal  Final   Gram Stain NO BACTERIA SEEN RARE WBC CONFIRMED BY TFK   Final   Culture NO GROWTH 2 DAYS  Final   Report Status PENDING  Incomplete  MRSA PCR Screening     Status: None   Collection Time: 05/28/15  9:30 PM  Result Value Ref Range Status   MRSA by PCR NEGATIVE NEGATIVE Final    Comment:        The GeneXpert MRSA Assay (FDA approved for NASAL specimens only), is one component of a comprehensive MRSA colonization surveillance program. It is not intended to diagnose MRSA infection nor to guide or monitor treatment for MRSA infections.     Coagulation Studies: No results for input(s): LABPROT,  INR in the last 72 hours.  Urinalysis:   Recent Labs Lab 05/28/15 0325  COLORURINE AMBER*  LABSPEC 1.031*  PHURINE 5.0  GLUCOSEU NEGATIVE  HGBUR 3+*  BILIRUBINUR NEGATIVE  KETONESUR 1+*  PROTEINUR >500*  NITRITE NEGATIVE  LEUKOCYTESUR TRACE*    Lipid Panel:  No results found for: CHOL, TRIG, HDL, CHOLHDL, VLDL, LDLCALC  HgbA1C: No results found for: HGBA1C  Urine Drug Screen:  No results found for: LABOPIA, COCAINSCRNUR, LABBENZ, AMPHETMU, THCU, LABBARB  Alcohol Level: No results for input(s): ETH in the last 168 hours.  Other results: EKG: normal EKG, normal sinus rhythm, unchanged from previous tracings.  Imaging: Ct Head Wo Contrast  05/28/2015   CLINICAL DATA:  Generalized weakness and difficulty walking.  EXAM: CT HEAD WITHOUT CONTRAST  TECHNIQUE: Contiguous axial images were obtained from the base of the skull through the vertex without intravenous contrast.  COMPARISON:  None.  FINDINGS: Skull and Sinuses:Negative for fracture or destructive process. The mastoids, middle ears, and imaged paranasal sinuses are  clear.  Prominent nasopharyngeal thickness without focal mass lesion on partial axial imaging.  Orbits: No acute abnormality.  Brain: Notable streak artifact at the level the ventral brain. No evidence of acute infarction, hemorrhage, hydrocephalus, or mass lesion/mass effect. Remote cortical and subcortical infarct with dense gliosis at the left frontal parietal junction.  IMPRESSION: 1. No acute finding. 2. Remote left frontal parietal infarct.   Electronically Signed   By: Marnee Spring M.D.   On: 05/28/2015 14:53   Mr Thoracic Spine W Wo Contrast  05/28/2015   CLINICAL DATA:  Bilateral arm and leg weakness beginning this morning with numbness and tingling. Unable to walk or move.  EXAM: MRI THORACIC AND LUMBAR SPINE WITHOUT AND WITH CONTRAST  TECHNIQUE: Multiplanar and multiecho pulse sequences of the thoracic and lumbar spine were obtained without and with  intravenous contrast.  CONTRAST:  13mL MULTIHANCE GADOBENATE DIMEGLUMINE 529 MG/ML IV SOLN  COMPARISON:  None.  FINDINGS: MR THORACIC SPINE FINDINGS  No significant spinal curvature. No degenerative disc disease, disc bulge or herniation. No stenosis of the spinal canal. No abnormal cord signal. Old minimal superior endplate deformity at T4. Benign appearing hemangiomas within the T5 and T10 vertebral bodies of no significance. Scout views in the cervical region show spondylosis at C5-6 with mild to moderate canal encroachment. After contrast administration, there is no abnormal enhancement of the cord itself but there is enhancement of the nerve roots of the cauda equina.  MR LUMBAR SPINE FINDINGS  This study also shows diffuse enhancement of the nerve roots of the cauda equina. This is typical and diagnostic of Guillain-Barre syndrome  There is no disc abnormality at L3-4 or above. At L4-5, the disc is degenerated and there is a shallow broad-based disc herniation slightly more prominent towards the right with mild narrowing of the right lateral recess. At L5-S1, there is a broad-based disc herniation that contacts the S1 nerve roots but does not cause definite nerve compression.  IMPRESSION: Diffuse enhancement of the nerve roots of the cauda equina diagnostic of Guillain-Barre syndrome.  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   Electronically Signed   By: Paulina Fusi M.D.   On: 05/28/2015 18:15   Mr Lumbar Spine W Wo Contrast  05/28/2015   CLINICAL DATA:  Bilateral arm and leg weakness beginning this morning with numbness and tingling. Unable to walk or move.  EXAM: MRI THORACIC AND LUMBAR SPINE WITHOUT AND WITH CONTRAST  TECHNIQUE: Multiplanar and multiecho pulse sequences of the thoracic and lumbar spine were obtained without and with intravenous contrast.  CONTRAST:  13mL MULTIHANCE GADOBENATE DIMEGLUMINE 529 MG/ML IV SOLN  COMPARISON:   None.  FINDINGS: MR THORACIC SPINE FINDINGS  No significant spinal curvature. No degenerative disc disease, disc bulge or herniation. No stenosis of the spinal canal. No abnormal cord signal. Old minimal superior endplate deformity at T4. Benign appearing hemangiomas within the T5 and T10 vertebral bodies of no significance. Scout views in the cervical region show spondylosis at C5-6 with mild to moderate canal encroachment. After contrast administration, there is no abnormal enhancement of the cord itself but there is enhancement of the nerve roots of the cauda equina.  MR LUMBAR SPINE FINDINGS  This study also shows diffuse enhancement of the nerve roots of the cauda equina. This is typical and diagnostic of Guillain-Barre syndrome  There is no disc abnormality at L3-4 or above. At L4-5, the disc is degenerated and there is  a shallow broad-based disc herniation slightly more prominent towards the right with mild narrowing of the right lateral recess. At L5-S1, there is a broad-based disc herniation that contacts the S1 nerve roots but does not cause definite nerve compression.  IMPRESSION: Diffuse enhancement of the nerve roots of the cauda equina diagnostic of Guillain-Barre syndrome.  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   Electronically Signed   By: Paulina Fusi M.D.   On: 05/28/2015 18:15     Assessment/Plan: 39 y.o. female  with a known history of stroke, chronic right-sided weakness is present into the ED with a chief complaint of generalized weakness that started a week prior and has been progressing. No prior history of similar symptoms. Concern for GBS. Pt is s/p LP with clear cytoalbuminological dissociation (elevated protein with normal cell count). MRI C and T spine no signs of transverse myelitis and inflamed roots.   GBS diagnosis in setting of viral infection 2 weeks prior.  Con't  IVIG . /kg x total5 days pt states she  is feeling better, strength in LE slightly improved con't PT/OT likely rehab  Niff at -40 stable, would be safe to transfer to regular bed and check Niff and VC q12 rather then q4   05/30/2015, 11:51 AM

## 2015-05-30 NOTE — Progress Notes (Signed)
   05/30/15 1115  Clinical Encounter Type  Visited With Patient  Visit Type Initial  Consult/Referral To Chaplain  Spiritual Encounters  Spiritual Needs Emotional  Stress Factors  Patient Stress Factors Health changes  Chaplain rounded in the unit and offered a compassionate presence to the patient. Patient expressed optimism and hope concerning her health. Wanted [place] to keep her in prayer. Chaplain Navpreet Szczygiel A. Markevious Ehmke Ext. [phone]

## 2015-05-30 NOTE — Progress Notes (Signed)
Room assignment  115 received for patient.  Patient updated. Report given to receiving RN, Skylar with no further questions.  Patient belonging's packed and ready to go with patient.

## 2015-05-30 NOTE — Progress Notes (Signed)
[facility] -  at [facility]   PATIENT NAME: Alexandria Murray    MR#:  [id number]  DATE OF BIRTH:  12-Aug-1976  SUBJECTIVE:seen at bedside.admitted GB syndrome.on IV IG ,pt mstill feels heavy in legs,righ facial numbness.her NIF is -35 cm.  CHIEF COMPLAINT:   Chief Complaint  Patient presents with  . Weakness   lower extremity weakness is getting better. Still reporting perioral numbness. Patient is clinically feeling better  REVIEW OF SYSTEMS:    Review of Systems  Constitutional: Negative for fever and chills.  HENT: Negative for hearing loss.   Eyes: Negative for blurred vision, double vision and photophobia.  Respiratory: Negative for cough, hemoptysis and shortness of breath.   Cardiovascular: Negative for palpitations, orthopnea and leg swelling.  Gastrointestinal: Negative for vomiting, abdominal pain and diarrhea.  Genitourinary: Negative for dysuria, urgency, frequency and hematuria.  Musculoskeletal: Negative for myalgias and neck pain.  Skin: Negative for rash.  Neurological: Positive for sensory change and focal weakness. Negative for dizziness, seizures, weakness and headaches.  Psychiatric/Behavioral: Negative for suicidal ideas and memory loss. The patient does not have insomnia.     Nutrition:  Tolerating Diet: Tolerating PT:      DRUG ALLERGIES:  No Known Allergies  VITALS:  Blood pressure 111/74, pulse 105, temperature 98.6 F (37 C), temperature source Oral, resp. rate 13, height  (1.549 m), weight 65.318 kg (144 lb), last menstrual period 05/26/2015, SpO2 100 %.  PHYSICAL EXAMINATION:   Physical Exam  Constitutional: She is oriented to person, place, and time.  Neurological: She is alert and oriented to person, place, and time. No cranial nerve deficit.  Decreased power in both legs    GENERAL:  39 y.o.-year-old patient lying in the bed with no acute distress.  EYES: Pupils equal, round, reactive to light and  accommodation. No scleral icterus. Extraocular muscles intact.  HEENT: Head atraumatic, normocephalic. Oropharynx and nasopharynx clear.  NECK:  Supple, no jugular venous distention. No thyroid enlargement, no tenderness.  LUNGS: Normal breath sounds bilaterally, no wheezing, rales,rhonchi or crepitation. No use of accessory muscles of respiration.  CARDIOVASCULAR: S1, S2 normal. No murmurs, rubs, or gallops.  ABDOMEN: Soft, nontender, nondistended. Bowel sounds present. No organomegaly or mass.  EXTREMITIES: No pedal edema, cyanosis, or clubbing.  NEUROLOGIC: Cranial nerves II through XII are intact. Muscle strength 4/5 in all extremities. Sensation intact. Gait not checked.  PSYCHIATRIC: The patient is alert and oriented x 3.  SKIN: No obvious rash, lesion, or ulcer.    LABORATORY PANEL:   CBC  Recent Labs Lab 05/29/15 0612  WBC 8.9  HGB 17.2*  HCT 49.9*  PLT 199   ------------------------------------------------------------------------------------------------------------------  Chemistries   Recent Labs Lab 05/29/15 0612  NA 135  K 3.3*  CL 104  CO2 23  GLUCOSE 103*  BUN 13  CREATININE 0.44  CALCIUM 8.8*  AST 15  ALT 13*  ALKPHOS 67  BILITOT 0.9   ------------------------------------------------------------------------------------------------------------------  Cardiac Enzymes No results for input(s): TROPONINI in the last 168 hours. ------------------------------------------------------------------------------------------------------------------  RADIOLOGY:  Ct Head Wo Contrast  05/28/2015   CLINICAL DATA:  Generalized weakness and difficulty walking.  EXAM: CT HEAD WITHOUT CONTRAST  TECHNIQUE: Contiguous axial images were obtained from the base of the skull through the vertex without intravenous contrast.  COMPARISON:  None.  FINDINGS: Skull and Sinuses:Negative for fracture or destructive process. The mastoids, middle ears, and imaged paranasal sinuses are  clear.  Prominent nasopharyngeal thickness without focal mass lesion on  partial axial imaging.  Orbits: No acute abnormality.  Brain: Notable streak artifact at the level the ventral brain. No evidence of acute infarction, hemorrhage, hydrocephalus, or mass lesion/mass effect. Remote cortical and subcortical infarct with dense gliosis at the left frontal parietal junction.  IMPRESSION: 1. No acute finding. 2. Remote left frontal parietal infarct.   Electronically Signed   By: Marnee Spring M.D.   On: 05/28/2015 14:53   Mr Thoracic Spine W Wo Contrast  05/28/2015   CLINICAL DATA:  Bilateral arm and leg weakness beginning this morning with numbness and tingling. Unable to walk or move.  EXAM: MRI THORACIC AND LUMBAR SPINE WITHOUT AND WITH CONTRAST  TECHNIQUE: Multiplanar and multiecho pulse sequences of the thoracic and lumbar spine were obtained without and with intravenous contrast.  CONTRAST:  13mL MULTIHANCE GADOBENATE DIMEGLUMINE 529 MG/ML IV SOLN  COMPARISON:  None.  FINDINGS: MR THORACIC SPINE FINDINGS  No significant spinal curvature. No degenerative disc disease, disc bulge or herniation. No stenosis of the spinal canal. No abnormal cord signal. Old minimal superior endplate deformity at T4. Benign appearing hemangiomas within the T5 and T10 vertebral bodies of no significance. Scout views in the cervical region show spondylosis at C5-6 with mild to moderate canal encroachment. After contrast administration, there is no abnormal enhancement of the cord itself but there is enhancement of the nerve roots of the cauda equina.  MR LUMBAR SPINE FINDINGS  This study also shows diffuse enhancement of the nerve roots of the cauda equina. This is typical and diagnostic of Guillain-Barre syndrome  There is no disc abnormality at L3-4 or above. At L4-5, the disc is degenerated and there is a shallow broad-based disc herniation slightly more prominent towards the right with mild narrowing of the right lateral recess.  At L5-S1, there is a broad-based disc herniation that contacts the S1 nerve roots but does not cause definite nerve compression.  IMPRESSION: Diffuse enhancement of the nerve roots of the cauda equina diagnostic of Guillain-Barre syndrome.  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   Electronically Signed   By: Paulina Fusi M.D.   On: 05/28/2015 18:15   Mr Lumbar Spine W Wo Contrast  05/28/2015   CLINICAL DATA:  Bilateral arm and leg weakness beginning this morning with numbness and tingling. Unable to walk or move.  EXAM: MRI THORACIC AND LUMBAR SPINE WITHOUT AND WITH CONTRAST  TECHNIQUE: Multiplanar and multiecho pulse sequences of the thoracic and lumbar spine were obtained without and with intravenous contrast.  CONTRAST:  13mL MULTIHANCE GADOBENATE DIMEGLUMINE 529 MG/ML IV SOLN  COMPARISON:  None.  FINDINGS: MR THORACIC SPINE FINDINGS  No significant spinal curvature. No degenerative disc disease, disc bulge or herniation. No stenosis of the spinal canal. No abnormal cord signal. Old minimal superior endplate deformity at T4. Benign appearing hemangiomas within the T5 and T10 vertebral bodies of no significance. Scout views in the cervical region show spondylosis at C5-6 with mild to moderate canal encroachment. After contrast administration, there is no abnormal enhancement of the cord itself but there is enhancement of the nerve roots of the cauda equina.  MR LUMBAR SPINE FINDINGS  This study also shows diffuse enhancement of the nerve roots of the cauda equina. This is typical and diagnostic of Guillain-Barre syndrome  There is no disc abnormality at L3-4 or above. At L4-5, the disc is degenerated and there is a shallow broad-based disc herniation slightly more prominent towards the right  with mild narrowing of the right lateral recess. At L5-S1, there is a broad-based disc herniation that contacts the S1 nerve roots but does not cause  definite nerve compression.  IMPRESSION: Diffuse enhancement of the nerve roots of the cauda equina diagnostic of Guillain-Barre syndrome.  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   Electronically Signed   By: Paulina Fusi M.D.   On: 05/28/2015 18:15     ASSESSMENT AND PLAN:   Active Problems:   Guillain Barr syndrome   Smoker  1. GB syndrome; Improving clinically  Continue IV IG for 5 days NIF every 12 hrs, Start physical therapy and occupational therapy  2.cystitis   IV rocpehi,follow cultures  3. Copd ;no wheezing Nebulizer treatments as needed basis  ,h/o tobacco abuse-counseled patient to quit smoking  4.htn;controlled Monitor closely for possible respiratory depression related to GB sndrome  Transfer patient to floor     All the records are reviewed and case discussed with Care Management/Social Workerr. Management plans discussed with the patient, family and they are in agreement.  CODE STATUS:full  TOTAL TIME TAKING CARE OF THIS PATIENT: Reviewing medical records, follow-up visit , new orders and coordination of care 35 min   POSSIBLE D/C IN1-2 DAYS, DEPENDING ON CLINICAL CONDITION.   Ramonita Lab M.D on 05/30/2015 at 1:56 PM  Between 7am to 6pm - Pager - [phone]  After 6pm go to [email or web address] - password [email or web address] [facility]  [facility]  Office  [phone]  CC: Primary care physician; No PCP Per Patient

## 2015-05-30 NOTE — Plan of Care (Signed)
Problem: Discharge Progression Outcomes Goal: Discharge plan in place and appropriate Individualization: Pt prefers to be called Alexandria Murray who lives at home with her husband.  Hx stroke w/ mild right sided weakness controlled by home medications. High fall risk due to generalized weakness. Bed alarm on, pt shows understanding how to utilize call system. Hourly rounding to be performed. Goal: Other Discharge Outcomes/Goals 1. Pt just transferred from CCU. Resting comfortably in bed.  2. C/o of chronic shoulder pain relieved by PRN Tramadol.  3. Hemodynamically:             -VSS, afebrile             -IVIG & IV Abx to be given this evening             -Neuro check Q4H unchanged, severe weakness remains generalized 4. Tolerating soft food diet well. Unable to tolerate liquids due inability to use face muscles effectively. 5. Seen by PT before transferring, pt states she was unable to stand alone & needs help moving in bed.

## 2015-05-30 NOTE — Evaluation (Signed)
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 Examination reveals that pt has decreased strength in bilateral upper and lower extremities as well as diminished sensation to light touch in bilateral upper and lower extremities. Both motor and sensory deficits appear more profound in the proximal musculature as compared to the distal extremities. Pt has good support system at home that is willing to provide her assistance. Her motivation to participate and get better is very apparent, and she is knowledgeable of the rehabilitation process due to her medical history. All that being said, she will continue to benefit from skilled PT in order to address her deficits and return her to her home environment safely. She agreed with this recommendation.    Follow Up Recommendations CIR    Equipment Recommendations  Rolling walker with 5" wheels    Recommendations for Other Services       Precautions / Restrictions Precautions Precautions: Fall Restrictions Weight Bearing Restrictions: No      Mobility  Bed Mobility Overal bed mobility: Needs Assistance Bed Mobility: Supine to Sit     Supine to sit: Mod assist     General bed mobility comments: Pt needs trunk support assist for rising from supine and assistance with drawing her legs out. Also needs help scooting to EOB due to pain in L shoulder  Transfers Overall transfer level: Needs assistance Equipment used: 2 person hand held assist Transfers: Sit to/from Stand Sit to Stand: Mod assist;+2 physical assistance         General transfer comment: Pt needs bodyweight support assist as well as placement of leg at posterior left knee in order to prevent  buckling. Pt able to stand x 10 seconds before needing to sit back down. HR was at 124 at end of transfer in supine.   Ambulation/Gait Ambulation/Gait assistance:  (Not appropriate this date)              Stairs            Wheelchair Mobility    Modified Rankin (Stroke Patients Only)       Balance Overall balance assessment:  (Not able to assess this date. )                                           Pertinent Vitals/Pain Pain Assessment:  (Pt c/o pain in R knee that is chronic)    Home Living Family/patient expects to be discharged to:: Private residence Living Arrangements: Spouse/significant other Available Help at Discharge: Family Type of Home: House Home Access: Stairs to enter Entrance Stairs-Rails: Can reach both Entrance Stairs-Number of Steps: 2 Home Layout: One level   Additional Comments: States that she has no DME at home    Prior Function Level of Independence: Independent   Gait / Transfers Assistance Needed: Independent prior to onset  ADL's / Homemaking Assistance Needed: Independent prior to onset  Comments: Pt was working, driving, completing ADLs, and ambulating independently      Hand Dominance        Extremity/Trunk Assessment   Upper Extremity Assessment: Generalized weakness (Difficult to assess due to R shoulder "  rotator cuff")           Lower Extremity Assessment: RLE deficits/detail;LLE deficits/detail RLE Deficits / Details: RLE MMT: ankle 4/5, knee 3+/5, hip (SLR) 3/5 (Weakness more profound proximally and on R side) LLE Deficits / Details: LLE MMT: ankle 4/5, knee 4/5, hip (SLR) 4/5     Communication   Communication: No difficulties  Cognition Arousal/Alertness: Awake/alert Behavior During Therapy: WFL for tasks assessed/performed Overall Cognitive Status: Within Functional Limits for tasks assessed                      General Comments      Exercises Other Exercises Other  Exercises: Pt performed bilateral LE therapeutic exercise x 10 reps at supervision for proper technique. Exercises included SAQ, ankle pumps, quad sets, hip abd/add, and glute sets,       Assessment/Plan    PT Assessment Patient needs continued PT services  PT Diagnosis Difficulty walking;Abnormality of gait;Generalized weakness;Acute pain   PT Problem List Decreased strength;Decreased range of motion;Decreased activity tolerance;Decreased balance;Decreased mobility;Decreased coordination;Decreased knowledge of use of DME;Decreased safety awareness;Decreased knowledge of precautions;Impaired sensation;Pain  PT Treatment Interventions DME instruction;Gait training;Stair training;Functional mobility training;Therapeutic activities;Therapeutic exercise;Balance training;Neuromuscular re-education;Cognitive remediation;Patient/family education;Wheelchair mobility training;Modalities   PT Goals (Current goals can be found in the Care Plan section) Acute Rehab PT Goals Patient Stated Goal: To get more rehab PT Goal Formulation: With patient Time For Goal Achievement: 06/13/15 Potential to Achieve Goals: Good    Frequency 7X/week   Barriers to discharge        Co-evaluation               End of Session Equipment Utilized During Treatment: Gait belt Activity Tolerance: Patient tolerated treatment well;Patient limited by pain Patient left: in bed;Other (comment) (handed over to nursing for unit transfer) Nurse Communication:  (unit transfer )         Time: 6213-0865 PT Time Calculation (min) (ACUTE ONLY): 27 min   Charges:         PT G CodesBenna Dunks June 20, 2015, 5:16 PM  Benna Dunks, SPT. [phone]

## 2015-05-30 NOTE — Progress Notes (Signed)
No new complaints. Since admission, has improved strength in extremities but now with R facial droop  Filed Vitals:   05/30/15 1200  BP: 111/74  Pulse: 105  Temp:   Resp: 13    NAD R facial droop No JVD Chest clear Tachy, reg NABS No edema  BMET    Component Value Date/Time   NA 135 05/29/2015 0612   K 3.3* 05/29/2015 0612   CL 104 05/29/2015 0612   CO2 23 05/29/2015 0612   GLUCOSE 103* 05/29/2015 0612   BUN 13 05/29/2015 0612   CREATININE 0.44 05/29/2015 0612   CALCIUM 8.8* 05/29/2015 0612   GFRNONAA >60 05/29/2015 0612   GFRAA >60 05/29/2015 0612    CBC    Component Value Date/Time   WBC 8.9 05/29/2015 0612   RBC 5.45* 05/29/2015 0612   HGB 17.2* 05/29/2015 0612   HCT 49.9* 05/29/2015 0612   PLT 199 05/29/2015 0612   MCV 91.4 05/29/2015 0612   MCH 31.5 05/29/2015 0612   MCHC 34.4 05/29/2015 0612   RDW 12.8 05/29/2015 0612   NIF -40  No new CXR  IMPRESSION: GBS No overt respiratory compromise Smoker  PLAN/REC: Transfer to med-surg Decrease freq of NIF measurements I have counseled re: importance of smoking cessation PCCM will sign off. Please call if we can be of further assistance  Billy Fischer, MD ; [facility] [phone].  After 5:30 PM or weekends, call [phone]

## 2015-05-31 LAB — BASIC METABOLIC PANEL
Anion gap: 8 (ref 5–15)
BUN: 23 mg/dL — AB (ref 6–20)
CHLORIDE: 103 mmol/L (ref 101–111)
CO2: 23 mmol/L (ref 22–32)
Calcium: 9 mg/dL (ref 8.9–10.3)
Creatinine, Ser: 0.57 mg/dL (ref 0.44–1.00)
GFR calc Af Amer: >60 mL/min (ref >60)
Glucose, Bld: 101 mg/dL — ABNORMAL HIGH (ref 65–99)
Potassium: 3.6 mmol/L (ref 3.5–5.1)
SODIUM: 134 mmol/L — AB (ref 135–145)

## 2015-05-31 LAB — CBC
HCT: 49.4 % — ABNORMAL HIGH (ref 35.0–47.0)
Hemoglobin: 16.6 g/dL — ABNORMAL HIGH (ref 12.0–16.0)
MCH: 31.1 pg (ref 26.0–34.0)
MCHC: 33.7 g/dL (ref 32.0–36.0)
MCV: 92.2 fL (ref 80.0–100.0)
Platelets: 179 10*3/uL (ref 150–440)
RBC: 5.35 MIL/uL — AB (ref 3.80–5.20)
RDW: 13.1 % (ref 11.5–14.5)
WBC: 4.5 10*3/uL (ref 3.6–11.0)

## 2015-05-31 LAB — CSF CULTURE

## 2015-05-31 LAB — CSF CULTURE W GRAM STAIN
Culture: NO GROWTH
Gram Stain: NONE SEEN
Special Requests: NORMAL

## 2015-05-31 MED ORDER — DEXTROSE-NACL 5-0.9 % IV SOLN
INTRAVENOUS | Status: DC
  Administered 2015-05-31 – 2015-06-02 (×4): via INTRAVENOUS

## 2015-05-31 MED ORDER — KETOROLAC TROMETHAMINE 30 MG/ML IJ SOLN
30.0000 mg | Freq: Four times a day (QID) | INTRAMUSCULAR | Status: DC
  Filled 2015-05-31 (×4): qty 1

## 2015-05-31 MED ORDER — KETOROLAC TROMETHAMINE 30 MG/ML IJ SOLN
30.0000 mg | Freq: Four times a day (QID) | INTRAMUSCULAR | Status: AC | PRN
  Administered 2015-05-31 – 2015-06-04 (×13): 30 mg via INTRAVENOUS
  Filled 2015-05-31 (×14): qty 1

## 2015-05-31 MED ORDER — ASPIRIN-ACETAMINOPHEN-CAFFEINE 250-250-65 MG PO TABS
1.0000 | ORAL_TABLET | Freq: Every day | ORAL | Status: DC | PRN
  Filled 2015-05-31: qty 1

## 2015-05-31 MED ORDER — MORPHINE SULFATE 2 MG/ML IJ SOLN
INTRAMUSCULAR | Status: AC
  Administered 2015-05-31: 10:00:00 1 mg via INTRAVENOUS
  Filled 2015-05-31: qty 1

## 2015-05-31 NOTE — Plan of Care (Signed)
Problem: Discharge Progression Outcomes Goal: Tolerating diet Outcome: Progressing Speech to evaluate her and her swallowing because of GB syndrome, awaiting speech evaluation

## 2015-05-31 NOTE — Progress Notes (Signed)
Initial Nutrition Assessment   INTERVENTION:   Coordination of Care: failed swallow evaluation today, currently NPO. Discussed with SLP who is hopeful that diet may be able to be advanced in the next day or so. If diet unable to be advanced, will reassess for possible nutrition support  NUTRITION DIAGNOSIS:   Inadequate oral intake related to inability to eat as evidenced by NPO status.  GOAL:   Patient will meet greater than or equal to 90% of their needs   MONITOR:    (Energy Intake, Anthropometrics, Digestive System)  REASON FOR ASSESSMENT:   Consult Poor PO  ASSESSMENT:    Pt admitted with progressive weakness with Guillain-Barre syndrome  Past Medical History  Diagnosis Date  . Stroke    Past Surgical History  Procedure Laterality Date  . Cholecystectomy    . Finger surgery       Diet Order:  Diet NPO time specified; pt failed swallow evaluation today  Energy Intake: no recorded po intake on previous soft diet  Electrolyte and Renal Profile:  Recent Labs Lab 05/28/15 1259 05/28/15 2211 05/29/15 0612 05/31/15 0521  BUN 23*  --  13 23*  CREATININE 0.69 0.56 0.44 0.57  NA 134*  --  135 134*  K 3.7  --  3.3* 3.6   Glucose Profile:   Recent Labs  05/28/15 2101 05/30/15 2123  GLUCAP 107* 113*   Protein Profile:   Recent Labs Lab 05/29/15 0612  ALBUMIN 3.7   Meds: MVI, D5-NS at 125 ml/hr, colace  Skin:  Reviewed, no issues  Last BM:  7/31   Nutrition Focused Physical Exam:  Unable to complete Nutrition-Focused physical exam at this time.    Height:   Ht Readings from Last 1 Encounters:  05/28/15  (1.549 m)    Weight: weight trend appears stable  Wt Readings from Last 1 Encounters:  05/28/15 144 lb (65.318 kg)    Wt Readings from Last 10 Encounters:  05/28/15 144 lb (65.318 kg)  05/28/15 144 lb (65.318 kg)  05/24/15 140 lb (63.504 kg)  05/22/15 144 lb (65.318 kg)    BMI:  Body mass index is 27.22  kg/(m^2).  Estimated Nutritional Needs:   Kcal:  1551-1691 kcals (BEE 1084, 1.3 AF, 1.1-1.2 IF) using IBW 48 kg  Protein:  48-58 g (1.0-1.2 g/kg)   Fluid:  1440-1680 mL (30-35 ml/kg)   EDUCATION NEEDS:   Education needs no appropriate at this time  MODERATE Care Level  Romelle Starcher MS, RD, LDN [phone] Pager

## 2015-05-31 NOTE — Plan of Care (Signed)
Problem: Discharge Progression Outcomes Goal: Other Discharge Outcomes/Goals Outcome: Progressing Patient is alert and oriented, c/o headache and shoulder pain, tramadol and tylenol given with relief. Pt c/o inability to drink from a cup or with a straw due to mouth numbness. Numbness and tingling remains in all extremities. Patient remains weak, able to call for assistance. Utilizes bedpan.

## 2015-05-31 NOTE — Progress Notes (Signed)
[facility] - [place] at [facility]   PATIENT NAME: Alexandria Murray    MR#:  [id number]  DATE OF BIRTH:  Dec 13, 1975  SUBJECTIVE:seen at bedside.admitted GB syndrome.on IV IG ,pt mstill feels heavy in legs,righ facial numbness.her NIF is -35 cm.  CHIEF COMPLAINT:   Chief Complaint  Patient presents with  . Weakness   lower extremity weakness is getting better. Still reporting worsening of perioral numbness. Not feeling good today. Failed swallow evaluation by speech therapy today  REVIEW OF SYSTEMS:    Review of Systems  Constitutional: Negative for fever and chills.  HENT: Negative for hearing loss.   Eyes: Negative for blurred vision, double vision and photophobia.  Respiratory: Negative for cough, hemoptysis and shortness of breath.   Cardiovascular: Negative for palpitations, orthopnea and leg swelling.  Gastrointestinal: Negative for vomiting, abdominal pain and diarrhea.  Genitourinary: Negative for dysuria, urgency, frequency and hematuria.  Musculoskeletal: Negative for myalgias and neck pain.  Skin: Negative for rash.  Neurological: Positive for sensory change and focal weakness. Negative for dizziness, seizures, weakness and headaches.       Worsening of perioral numbness  Psychiatric/Behavioral: Negative for suicidal ideas and memory loss. The patient does not have insomnia.     Nutrition:  Nothing by mouth Failed swallow evaluation      DRUG ALLERGIES:  No Known Allergies  VITALS:  Blood pressure 102/67, pulse 115, temperature 98 F (36.7 C), temperature source Oral, resp. rate 20, height 5\' 1"  (1.549 m), weight 65.318 kg (144 lb), last menstrual period 05/26/2015, SpO2 100 %.  PHYSICAL EXAMINATION:   Physical Exam  Constitutional: She is oriented to person, place, and time.  Neurological: She is alert and oriented to person, place, and time. No cranial nerve deficit.  Decreased power in both legs    GENERAL:  39 y.o.-year-old  patient lying in the bed with no acute distress.  EYES: Pupils equal, round, reactive to light and accommodation. No scleral icterus. Extraocular muscles intact.  HEENT: Head atraumatic, normocephalic. Oropharynx and nasopharynx clear.  NECK:  Supple, no jugular venous distention. No thyroid enlargement, no tenderness.  LUNGS: Normal breath sounds bilaterally, no wheezing, rales,rhonchi or crepitation. No use of accessory muscles of respiration.  CARDIOVASCULAR: S1, S2 normal. No murmurs, rubs, or gallops.  ABDOMEN: Soft, nontender, nondistended. Bowel sounds present. No organomegaly or mass.  EXTREMITIES: No pedal edema, cyanosis, or clubbing.  NEUROLOGIC: Cranial nerves II through XII are intact. Muscle strength 5 out of 5 in lower extremities bilaterally and 4/5 in upper extremities. Decreased light touch sensation in the upper extremities. Gait not checked.  PSYCHIATRIC: The patient is alert and oriented x 3.  SKIN: No obvious rash, lesion, or ulcer.    LABORATORY PANEL:   CBC  Recent Labs Lab 05/31/15 0521  WBC 4.5  HGB 16.6*  HCT 49.4*  PLT 179   ------------------------------------------------------------------------------------------------------------------  Chemistries   Recent Labs Lab 05/29/15 0612 05/31/15 0521  NA 135 134*  K 3.3* 3.6  CL 104 103  CO2 23 23  GLUCOSE 103* 101*  BUN 13 23*  CREATININE 0.44 0.57  CALCIUM 8.8* 9.0  AST 15  --   ALT 13*  --   ALKPHOS 67  --   BILITOT 0.9  --    ------------------------------------------------------------------------------------------------------------------  Cardiac Enzymes No results for input(s): TROPONINI in the last 168 hours. ------------------------------------------------------------------------------------------------------------------  RADIOLOGY:  No results found.   ASSESSMENT AND PLAN:   Active Problems:   Guillain Barr syndrome  Smoker  1. GB syndrome;   -clinically  worsening Discussed with neurology, will Continue IV IG for a total of 7 days NIF every 12 hrs, Continue physical therapy and occupational therapy May consider plasmapheresis if there is no improvement after 7 days of IVIG Failed swallow evaluation - NPO ivf - d5 ns   2.cystitis   IV rocpehi, follow cultures-probably contaminated specimen and it has multiple species  3. Copd no wheezing Nebulizer treatments as needed basis  ,h/o tobacco abuse-counseled patient to quit smoking  4.htn;controlled Monitor closely for possible respiratory depression related to GB sndrome       All the records are reviewed and case discussed with Care Management/Social Workerr. Management plans discussed with the patient, family and neurologythey are in agreement.  CODE STATUS:full  TOTAL TIME TAKING CARE OF THIS PATIENT: Reviewing medical records, follow-up visit , new orders and coordination of care 35 min   POSSIBLE D/C IN1-2 DAYS, DEPENDING ON CLINICAL CONDITION.   Ramonita Lab M.D on 05/31/2015 at 1:18 PM  Between 7am to 6pm - Pager - [phone]  After 6pm go to [email or web address] - password [email or web address] [facility]  [facility]  Office  [phone]  CC: Primary care physician; No PCP Per Patient

## 2015-05-31 NOTE — Plan of Care (Signed)
Problem: Discharge Progression Outcomes Goal: Complications resolved/controlled Outcome: Progressing PT in progress, tolerating standing and progressing slowly. Depending on evaluation from PT she may go to inpatient rehab. Speech to  See pt and evaluate swallowing and process of disease affecting pts swallowing OT working with pt to evaluate what level of therapy she will need, disease still in progress

## 2015-05-31 NOTE — Care Management (Signed)
Neurologist recommending Immune Globulin x 7 days, then transferring to another hospital for Plasmapheresis.  Immune Globin initial dose 05/28/15.  Gwenette Greet RN MSN Care Management [phone]

## 2015-05-31 NOTE — Care Management (Signed)
Admitted to [facility] with the diagnosis of Guillian Barre syndrome. Works at the [employer] in [place].  Spoke with Ms. Hallahan at the bedside. States she was in the process of calling Helane Rima DO  at [facility] prior to getting sick to she if she is accepting new patient, but didn't get the opportunity. Helane Rima DO works at the [employer] at [facility]. Unsure if she will accept new patients.  Speech evaluation completed. Tolerated just a few spoons full of liquids. Will keep NPO per  Speech. Physical therapy evaluation is in progress  IVIG x 5 days, started 05/28/15. Hgb 16.6 Gwenette Greet RN MSN Care Management [phone]

## 2015-05-31 NOTE — Consult Note (Signed)
CC: weakness in lower extremities   HPI: Alexandria Murray is an 39 y.o. female  with a known history of stroke, chronic right-sided weakness is present into the ED with a chief complaint of generalized weakness that started a week prior and has been progressing. No prior history of similar symptoms. Concern for GBS. Pt is s/p LP with clear cytoalbuminological dissociation (elevated protein with normal cell count). MRI C and T spine no signs of transverse myelitis and inflamed roots.   Niff at -40  Slightly worsening dysarthria today. Failed speech eval today.   Past Medical History  Diagnosis Date  . Stroke     Past Surgical History  Procedure Laterality Date  . Cholecystectomy    . Finger surgery      No family history on file.  Social History:  reports that she has been smoking Cigarettes.  She has been smoking about 2.00 packs per day. She does not have any smokeless tobacco history on file. She reports that she does not drink alcohol or use illicit drugs.  No Known Allergies  Medications: I have reviewed the patient's current medications.  ROS: History obtained from the patient  General ROS: negative for - chills, fatigue, fever, night sweats, weight gain or weight loss Psychological ROS: negative for - behavioral disorder, hallucinations, memory difficulties, mood swings or suicidal ideation Ophthalmic ROS: negative for - blurry vision, double vision, eye pain or loss of vision ENT ROS: negative for - epistaxis, nasal discharge, oral lesions, sore throat, tinnitus or vertigo Allergy and Immunology ROS: negative for - hives or itchy/watery eyes Hematological and Lymphatic ROS: negative for - bleeding problems, bruising or swollen lymph nodes Endocrine ROS: negative for - galactorrhea, hair pattern changes, polydipsia/polyuria or temperature intolerance Respiratory ROS: negative for - cough, hemoptysis, shortness of breath or wheezing Cardiovascular ROS: negative for - chest  pain, dyspnea on exertion, edema or irregular heartbeat Gastrointestinal ROS: negative for - abdominal pain, diarrhea, hematemesis, nausea/vomiting or stool incontinence Genito-Urinary ROS: negative for - dysuria, hematuria, incontinence or urinary frequency/urgency Musculoskeletal ROS: negative for - joint swelling or muscular weakness Neurological ROS: as noted in HPI Dermatological ROS: negative for rash and skin lesion changes  Physical Examination: Blood pressure 102/67, pulse 115, temperature 98 F (36.7 C), temperature source Oral, resp. rate 20, height  (1.549 m), weight 65.318 kg (144 lb), last menstrual period 05/26/2015, SpO2 100 %.    Neurological Examination Mental Status: Alert, oriented, thought content appropriate.  Speech fluent without evidence of aphasia.  Able to follow 3 step commands without difficulty. Cranial Nerves: II: Discs flat bilaterally; Visual fields grossly normal, pupils equal, round, reactive to light and accommodation III,IV, VI: ptosis not present, extra-ocular motions intact bilaterally V,VII: small R facial droop. And perioral numbness.   VIII: hearing normal bilaterally IX,X: gag reflex present XI: bilateral shoulder shrug XII: midline tongue extension Motor: Right : Upper extremity   4+/5    Left:     Upper extremity  4+/5  Lower extremity   4/5     Lower extremity   4/5 Tone and bulk:normal tone throughout; no atrophy noted Sensory: Pinprick and light touch intact throughout, bilaterally Deep Tendon Reflexes: absent reflexes in there lower extremities Plantars: Right: downgoing   Left: downgoing Cerebellar: normal finger-to-nose, normal rapid alternating movements and normal heel-to-shin test Gait: not tested.        Laboratory Studies:   Basic Metabolic Panel:  Recent Labs Lab 05/28/15 1259 05/28/15 2211 05/29/15 0612 05/31/15  0521  NA 134*  --  135 134*  K 3.7  --  3.3* 3.6  CL 102  --  104 103  CO2 22  --  23 23   GLUCOSE 114*  --  103* 101*  BUN 23*  --  13 23*  CREATININE 0.69 0.56 0.44 0.57  CALCIUM 9.6  --  8.8* 9.0    Liver Function Tests:  Recent Labs Lab 05/29/15 0612  AST 15  ALT 13*  ALKPHOS 67  BILITOT 0.9  PROT 7.6  ALBUMIN 3.7   No results for input(s): LIPASE, AMYLASE in the last 168 hours. No results for input(s): AMMONIA in the last 168 hours.  CBC:  Recent Labs Lab 05/28/15 1259 05/28/15 2211 05/29/15 0612 05/31/15 0521  WBC 17.7* 12.7* 8.9 4.5  HGB 18.8* 17.1* 17.2* 16.6*  HCT 54.9* 50.9* 49.9* 49.4*  MCV 91.2 91.9 91.4 92.2  PLT 277 224 199 179    Cardiac Enzymes:  Recent Labs Lab 05/28/15 2211  CKTOTAL 53    BNP: Invalid input(s): POCBNP  CBG:  Recent Labs Lab 05/28/15 2101 05/30/15 2123  GLUCAP 107* 113*    Microbiology: Results for orders placed or performed during the hospital encounter of 05/28/15  Urine culture     Status: None   Collection Time: 05/28/15  3:25 AM  Result Value Ref Range Status   Specimen Description URINE, CLEAN CATCH  Final   Special Requests NONE  Final   Culture MULTIPLE SPECIES PRESENT, SUGGEST RECOLLECTION  Final   Report Status 05/30/2015 FINAL  Final  CSF culture     Status: None   Collection Time: 05/28/15  3:48 PM  Result Value Ref Range Status   Specimen Description CSF  Final   Special Requests Normal  Final   Gram Stain NO BACTERIA SEEN RARE WBC CONFIRMED BY TFK   Final   Culture NO GROWTH 3 DAYS  Final   Report Status 05/31/2015 FINAL  Final  MRSA PCR Screening     Status: None   Collection Time: 05/28/15  9:30 PM  Result Value Ref Range Status   MRSA by PCR NEGATIVE NEGATIVE Final    Comment:        The GeneXpert MRSA Assay (FDA approved for NASAL specimens only), is one component of a comprehensive MRSA colonization surveillance program. It is not intended to diagnose MRSA infection nor to guide or monitor treatment for MRSA infections.     Coagulation Studies: No results for  input(s): LABPROT, INR in the last 72 hours.  Urinalysis:   Recent Labs Lab 05/28/15 0325  COLORURINE AMBER*  LABSPEC 1.031*  PHURINE 5.0  GLUCOSEU NEGATIVE  HGBUR 3+*  BILIRUBINUR NEGATIVE  KETONESUR 1+*  PROTEINUR >500*  NITRITE NEGATIVE  LEUKOCYTESUR TRACE*    Lipid Panel:  No results found for: CHOL, TRIG, HDL, CHOLHDL, VLDL, LDLCALC  HgbA1C: No results found for: HGBA1C  Urine Drug Screen:  No results found for: LABOPIA, COCAINSCRNUR, LABBENZ, AMPHETMU, THCU, LABBARB  Alcohol Level: No results for input(s): ETH in the last 168 hours.  Other results: EKG: normal EKG, normal sinus rhythm, unchanged from previous tracings.  Imaging: No results found.   Assessment/Plan: 39 y.o. female  with a known history of stroke, chronic right-sided weakness is present into the ED with a chief complaint of generalized weakness that started a week prior and has been progressing. No prior history of similar symptoms. Concern for GBS. Pt is s/p LP with clear cytoalbuminological dissociation (elevated protein  with normal cell count). MRI C and T spine no signs of transverse myelitis and inflamed roots.   GBS diagnosis in setting of viral infection 2 weeks prior.  Worsening bulbar symptoms this AM IVIG for total 7 days unless significantly worsens and needs to be transferred to outside facility for plasmapheresis.     05/31/2015, 1:34 PM

## 2015-05-31 NOTE — Progress Notes (Signed)
NIF -40 

## 2015-05-31 NOTE — Evaluation (Signed)
Clinical/Bedside Swallow Evaluation Patient Details  Name: Alexandria Murray MRN: [id number] Date of Birth: [birth year]  Today's Date: 05/31/2015 Time: SLP Start Time (ACUTE ONLY): 1000 SLP Stop Time (ACUTE ONLY): 1100 SLP Time Calculation (min) (ACUTE ONLY): 60 min  Past Medical History:  Past Medical History  Diagnosis Date  . Stroke    Past Surgical History:  Past Surgical History  Procedure Laterality Date  . Cholecystectomy    . Finger surgery     HPI:  Pt is a 39 y.o. female with a known history of stroke, chronic right-sided weakness, bilateral pain in shoulders who presented to the ED with a chief complaint of generalized weakness that started a week prior and has been progressing. No prior history of similar symptoms. Concern for GBS in setting of a viral infection 2 weeks prior. Pt is s/p LP with clear cytoalbuminological dissociation (elevated protein with normal cell count). MRI C and T spine no signs of transverse myelitis and inflamed roots. Pt is having increased bulbar s/s w/ worsening swallowing and speech per pt report than yesterday even. Pt is NPO.    Assessment / Plan / Recommendation Clinical Impression  Pt presents w/ increased risk for aspiration sec. to the severity of her oral phase deficits and the reduce bolus control she has w/ all trials. This impacts the pharyngeal phase and increases risk for bolus material to slip into the pharynx prematurely and the risk for bolus material to be aspirated. Noted pharyngeal swallow appeared slightly reduced in elevation during volitional swallow as well. Pt presents w/ anxiousness about her swallowing and immediately stated it did not "feel as good as yesterday" when she attempted po trials w/ SLP. Due to her increased risk for aspiration at this time, rec. continued NPO status w/ pleasure po's of 1/2 tsp of applesauce and single ice chips following strict aspiration precautions - stopping if increased coughing noted. Rec. oral care  for stim and hygiene. ST will f/u tomorrow w/ continued assessment.     Aspiration Risk  Moderate    Diet Recommendation NPO (pleasure po's of applesauce; ice chips w/ supervision) Strict aspiration precautions - stop if overt s/s of aspiration noted during any pleasure po's.  Medication Administration: Crushed with puree (if can tolerate) Compensations: Slow rate;Small sips/bites;Check for pocketing;Multiple dry swallows after each bite/sip    Other  Recommendations Recommended Consults:  (Dietician consult) Oral Care Recommendations: Oral care BID;Staff/trained caregiver to provide oral care Other Recommendations:  (TBD)   Follow Up Recommendations       Frequency and Duration min 3x week  1 week   Pertinent Vitals/Pain Shoulder discomfort; NSG made aware    SLP Swallow Goals  see care plan   Swallow Study Prior Functional Status   pt lived at home independently. Pt reported she has chronic bilateral shoulder pain.     General Date of Onset: 05/28/15 Other Pertinent Information: Pt is a 39 y.o. female with a known history of stroke, chronic right-sided weakness, bilateral pain in shoulders who presented to the ED with a chief complaint of generalized weakness that started a week prior and has been progressing. No prior history of similar symptoms. Concern for GBS in setting of a viral infection 2 weeks prior. Pt is s/p LP with clear cytoalbuminological dissociation (elevated protein with normal cell count). MRI C and T spine no signs of transverse myelitis and inflamed roots. Pt is having increased bulbar s/s w/ worsening swallowing and speech per pt report than yesterday even. Pt  is NPO.  Type of Study: Bedside swallow evaluation Previous Swallow Assessment: none Diet Prior to this Study: Regular;Thin liquids (at home) Temperature Spikes Noted: No (WBC 12.7 on 8.1.16; WBC 4.5 on 8.4.16) Respiratory Status: Room air History of Recent Intubation: No Behavior/Cognition:  Alert;Cooperative;Pleasant mood (min. anxious) Oral Cavity - Dentition: Missing dentition (partial dentures U/L) Self-Feeding Abilities: Total assist (cannot use UEs effectively or w/out discomfort per pt) Patient Positioning: Upright in bed Baseline Vocal Quality: Normal (volume wfl; Dysarthria - moderate) Volitional Cough: Strong Volitional Swallow: Able to elicit (reduced)    Oral/Motor/Sensory Function Overall Oral Motor/Sensory Function: Impaired Labial ROM: Reduced right;Reduced left Labial Symmetry: Abnormal symmetry right;Abnormal symmetry left Labial Strength: Reduced (bilat) Labial Sensation: Reduced Lingual ROM: Reduced right Lingual Symmetry: Within Functional Limits Lingual Strength: Reduced Facial Symmetry: Within Functional Limits Velum: Within Functional Limits Mandible:  (fair)   Ice Chips Ice chips: Impaired Presentation: Spoon (fed; x3) Oral Phase Impairments: Reduced labial seal;Reduced lingual movement/coordination;Impaired anterior to posterior transit Oral Phase Functional Implications: Right lateral sulci pocketing;Left lateral sulci pocketing;Prolonged oral transit;Oral residue Pharyngeal Phase Impairments:  (none)   Thin Liquid Thin Liquid: Impaired Presentation: Spoon (fed; x5 trials) Oral Phase Impairments: Reduced labial seal;Reduced lingual movement/coordination;Impaired anterior to posterior transit Oral Phase Functional Implications: Right anterior spillage;Prolonged oral transit;Right lateral sulci pocketing;Left lateral sulci pocketing;Oral residue Pharyngeal  Phase Impairments: Cough - Immediate (x1/5 trials)    Nectar Thick Nectar Thick Liquid: Impaired Presentation: Spoon (fed; 2 trials) Oral Phase Impairments: Reduced labial seal;Reduced lingual movement/coordination;Impaired anterior to posterior transit Oral phase functional implications: Right lateral sulci pocketing;Left lateral sulci pocketing;Prolonged oral transit;Oral residue Pharyngeal  Phase Impairments:  (none)   Honey Thick Honey Thick Liquid: Not tested   Puree Puree: Impaired Presentation: Spoon (fed; 2 trials) Oral Phase Impairments: Reduced labial seal;Reduced lingual movement/coordination;Impaired anterior to posterior transit Oral Phase Functional Implications: Right lateral sulci pocketing;Left lateral sulci pocketing;Prolonged oral transit;Oral residue Pharyngeal Phase Impairments:  (none) Other Comments: no further trials attempted sec. to pt's discomfort and request to stop   Solid   GO    Solid: Not tested      Jerilynn Som, MS, CCC-SLP   Alexandria Murray 05/31/2015,3:35 PM

## 2015-05-31 NOTE — Progress Notes (Signed)
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 She was able to ambulate short distances and improve her exercise program. Pt states that she wants to continue to push herself in rehab as far as appropriate. Due to her decreased sensation and strength bilaterally, she will continue to benefit from skilled PT in order to address these deficits and eventually return home safely.   Follow Up Recommendations  CIR     Equipment Recommendations  Rolling walker with 5" wheels    Recommendations for Other Services       Precautions / Restrictions Precautions Precautions: Fall Restrictions Weight Bearing Restrictions: No    Mobility  Bed Mobility Overal bed mobility: Needs Assistance Bed Mobility: Supine to Sit     Supine to sit: Mod assist     General bed mobility comments: Pt requires trunk support for getting up from supine, but manages her LEs pretty well. Needs cues for using her hands to assist with scooting to EOB  Transfers Overall transfer level: Needs assistance Equipment used: Rolling walker (2 wheeled) Transfers: Sit to/from Stand Sit to Stand: Mod assist;+2 physical assistance         General transfer comment: Pt transfers with mod assist +2 for bodyweight support and therapist blocking left knee to prevent buckling. Pt performed transfer with greater ease today  Ambulation/Gait Ambulation/Gait assistance: Mod assist;+2 safety/equipment Ambulation Distance (Feet): 8 Feet Assistive device: Rolling walker (2 wheeled) Gait Pattern/deviations: Step-to pattern;Decreased step length - right;Decreased step length - left;Steppage Gait velocity: decreased     General Gait Details: Pt performed two sets of ambulation (one 3 ft, one 5 ft) with a break inbetween sets. She requires assist for blocking of knee as well as guarding and chair follow. Pt very motivated to participate in therapy even though she is not feeling great currently    Stairs            Wheelchair Mobility    Modified Rankin (Stroke Patients Only)       Balance Overall balance assessment: History of Falls                                  Cognition Arousal/Alertness: Awake/alert Behavior During Therapy: WFL for tasks assessed/performed Overall Cognitive Status: Within Functional Limits for tasks assessed                      Exercises Other Exercises Other Exercises: Pt performed bilateral LE therapeutic exercise x 12 reps at supervision for proper technique. Exercises included LAQ, pillow squeeze, ankle pumps, quad sets, hip abd/add, and glute sets,     General Comments        Pertinent Vitals/Pain Pain Assessment:  (reports pain in shoulders and L knee)    Home Living                      Prior Function            PT Goals (current goals can now be found in the care plan section) Acute Rehab PT Goals Patient Stated Goal: To participate in therapy PT  Goal Formulation: With patient Time For Goal Achievement: 06/13/15 Potential to Achieve Goals: Good Progress towards PT goals: Progressing toward goals    Frequency  7X/week    PT Plan Current plan remains appropriate    Co-evaluation             End of Session Equipment Utilized During Treatment: Gait belt Activity Tolerance: Patient tolerated treatment well;Patient limited by fatigue Patient left: in chair;with call bell/phone within reach;with chair alarm set     Time: 1610-9604 PT Time Calculation (min) (ACUTE ONLY): 39 min  Charges:                       G CodesBenna Dunks 2015-06-28, 3:54 PM Benna Dunks, SPT. [phone]

## 2015-06-01 LAB — BASIC METABOLIC PANEL
Anion gap: 6 (ref 5–15)
BUN: 27 mg/dL — AB (ref 6–20)
CO2: 23 mmol/L (ref 22–32)
Calcium: 8.7 mg/dL — ABNORMAL LOW (ref 8.9–10.3)
Chloride: 107 mmol/L (ref 101–111)
Creatinine, Ser: 0.52 mg/dL (ref 0.44–1.00)
GFR calc Af Amer: >60 mL/min (ref >60)
GFR calc non Af Amer: >60 mL/min (ref >60)
Glucose, Bld: 112 mg/dL — ABNORMAL HIGH (ref 65–99)
Potassium: 3.6 mmol/L (ref 3.5–5.1)
SODIUM: 136 mmol/L (ref 135–145)

## 2015-06-01 NOTE — Plan of Care (Signed)
Problem: Discharge Progression Outcomes Goal: Other Discharge Outcomes/Goals Outcome: Progressing Pt is alert and oriented, c/o shoulder pain, Toradol given with relief. Pt states this medication works better than morphine. Patient repositioned in bed, calls for assistance to utilize bedpan. Pt remains NPO, states she is unable to swallow pills. Continue IV fluid NS with D5 at 125 ml/hr.

## 2015-06-01 NOTE — Plan of Care (Signed)
Problem: Discharge Progression Outcomes Goal: Pain controlled with appropriate interventions Outcome: Progressing toradol x 2 today.  Pt reports this helped her pain Goal: Hemodynamically stable Outcome: Not Progressing Cont to have  Motor function deficit. Goal: Complications resolved/controlled Outcome: Not Progressing Pt working with pt. Chair today tol well. Goal: Tolerating diet Outcome: Progressing Speech saw today pt started on puree diet

## 2015-06-01 NOTE — Progress Notes (Signed)
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 She remains highly motivated to participate in therapy given the challenge that it presents her. Even though she ambulated a great deal further today and is transferring well, she still has profound weakness and sensory deficits. She also is slightly impulsive. Pt requires extensive skilled PT techniques in order to progress her mobility and return her to premorbid state. Pt given occasional rest is able to tolerate longer therapy sessions.  Follow Up Recommendations  CIR     Equipment Recommendations  Rolling walker with 5" wheels    Recommendations for Other Services       Precautions / Restrictions Precautions Precautions: Fall Restrictions Weight Bearing Restrictions: No    Mobility  Bed Mobility Overal bed mobility: Needs Assistance Bed Mobility: Supine to Sit     Supine to sit: Mod assist     General bed mobility comments: Pt requires trunk support for getting up from supine, but manages her LEs pretty well. Needs cues for using her hands to assist with scooting to EOB  Transfers Overall transfer level: Needs assistance Equipment used: Rolling walker (2 wheeled) Transfers: Sit to/from Stand Sit to Stand: Mod assist;+2 physical assistance         General transfer comment: Pt transfers with mod assist +2 for bodyweight support. 2 therapists necessary due to bilateral sporadic knee buckling. Pt performed transfer with greater ease today, but still is considerably weak. She needs cues for hand placement on walker prior to transfer and she also is slightly impulsive and needs to be reminded to wait for therapy.   Ambulation/Gait Ambulation/Gait assistance: Min  assist Ambulation Distance (Feet): 50 Feet (5 sets of 10 feet) Assistive device: Rolling walker (2 wheeled) Gait Pattern/deviations: Shuffle;Decreased step length - right;Decreased step length - left Gait velocity: decreased    General Gait Details: Pt performed gait at greater distance today, however secondary to weakness and pain, she experiences sporadic knee buckling in bilateral knees. Due to pain it was observed that she was placing increased weight bearing through hands on RW. It was also observed that she had decreased heel strike and a forward flexed posture. She performed 3 sets of forward ambulation of 10 ft with chair follow and assist for advancement of RW and occasional buckling support and attempt to block knee. She also performed 2 sets of lateral ambulation down and back (20 ft) with same +2 assist for support and occasional advancement of walker to sequence with her lateral gait. Pt was given a break between each set of ambulation due to fatigue.    Stairs            Wheelchair Mobility    Modified Rankin (Stroke Patients Only)       Balance Overall balance assessment: History of Falls                                  Cognition Arousal/Alertness: Awake/alert Behavior During Therapy: WFL for tasks assessed/performed Overall Cognitive Status: Within Functional Limits for tasks assessed                      Exercises Other Exercises Other Exercises:  Pt performed bilateral LE therapeutic exercise x 15 reps at min assist for facilitation of movement. Exercises included  SAQ, LAQ, pillow squeeze, ankle pumps, quad sets, hip abd/add, and glute sets,     General Comments        Pertinent Vitals/Pain Pain Assessment: No/denies pain    Home Living                      Prior Function            PT Goals (current goals can now be found in the care plan section) Acute Rehab PT Goals Patient Stated Goal: To participate in  therapy PT Goal Formulation: With patient Time For Goal Achievement: 06/13/15 Potential to Achieve Goals: Good Progress towards PT goals: Progressing toward goals    Frequency  7X/week    PT Plan Current plan remains appropriate    Co-evaluation             End of Session Equipment Utilized During Treatment: Gait belt Activity Tolerance: Patient tolerated treatment well;Patient limited by fatigue Patient left: in chair;with call bell/phone within reach;with chair alarm set     Time: 1610-9604 PT Time Calculation (min) (ACUTE ONLY): 42 min  Charges:                       G CodesBenna Dunks Jun 29, 2015, 3:35 PM Benna Dunks, SPT. [phone]

## 2015-06-01 NOTE — Progress Notes (Signed)
Nutrition Follow-up   INTERVENTION:   Meals and Snacks: Cater to patient preferences; SLP following recommending jello, ice creams, pudding and mashed potatoes on meal trays. SLP also recommending liquids and soups to come in a mug for ease of consuming. Medical Food Supplement Therapy: will recommend Mighty Shakes on meal trays TID, Magic Cup BID for added nutrition (each shake provides 300kcals and 9g protein) Coordination of Care: will recommend stronger bowel regimen as pt with no BM in 5 days.   NUTRITION DIAGNOSIS:   Inadequate oral intake related to inability to eat as evidenced by NPO status; improved with diet advancement  GOAL:   Patient will meet greater than or equal to 90% of their needs; ongoing  MONITOR:    (Energy Intake, Anthropometrics, Digestive System)  ASSESSMENT:    Pt sitting up in chair this afternoon, just eaten lunch.    Diet Order:  DIET - DYS 1 Room service appropriate?: Yes; Fluid consistency:: Thin    Current Nutrition: Pt reports eating most of lunch today. Pt reports tiring easily and taking a long time to eat but felt she ate a good amount today. 25% of lunch recorded.  Food/Nutrition-Related History: Pt did report prior to Guillain Barre syndrome, about 2 weeks ago, pt was eating usually one large meal a day. Pt reports usual intake would be 3 chicken salad sandwiches at meal time with a drink and be 'good for the day.' Pt reports appetite was doing well. When weakness started pt reports eating less at meal times. Pt with poor po intake since admission secondary to medical status.  Medications: colace, ferrous sulfate, MVI, D5 NS at 125mL/hr  Electrolyte/Renal Profile and Glucose Profile:   Recent Labs Lab 05/29/15 0612 05/31/15 0521 06/01/15 0343  NA 135 134* 136  K 3.3* 3.6 3.6  CL 104 103 107  CO2 BUN 13 23* 27*  CREATININE 0.44 0.57 0.52  CALCIUM 8.8* 9.0 8.7*  GLUCOSE 103* 101* 112*   Protein Profile:  Recent  Labs Lab 05/29/15 0612  ALBUMIN 3.7    Gastrointestinal Profile: Last BM:  7/31   Nutrition-Focused Physical Exam Findings:  Unable to complete Nutrition-Focused physical exam at this time.    Weight Change: Pt reports weight of 144lbs but that when she was transferred to 1C it was 136lbs. Not sure of accuracy.   Skin:  Reviewed, no issues  Height:   Ht Readings from Last 1 Encounters:  05/28/15  (1.549 m)    Weight:   Wt Readings from Last 1 Encounters:  05/28/15 144 lb (65.318 kg)   Wt Readings from Last 10 Encounters:  05/28/15 144 lb (65.318 kg)  05/28/15 144 lb (65.318 kg)  05/24/15 140 lb (63.504 kg)  05/22/15 144 lb (65.318 kg)   BMI:  Body mass index is 27.22 kg/(m^2).  Estimated Nutritional Needs:   Kcal:  1551-1691 kcals (BEE 1084, 1.3 AF, 1.1-1.2 IF) using IBW 48 kg  Protein:  48-58 g (1.0-1.2 g/kg)   Fluid:  1440-1680 mL (30-35 ml/kg)   EDUCATION NEEDS:   Education needs no appropriate at this time    MODERATE Care Level  Leda Quail, RD, LDN Pager [phone]

## 2015-06-01 NOTE — Consult Note (Signed)
CC: weakness in lower extremities   HPI: Alexandria Murray is an 39 y.o. female  with a known history of stroke, chronic right-sided weakness is present into the ED with a chief complaint of generalized weakness that started a week prior and has been progressing. No prior history of similar symptoms. Concern for GBS. Pt is s/p LP with clear cytoalbuminological dissociation (elevated protein with normal cell count). MRI C and T spine no signs of transverse myelitis and inflamed roots.   Niff at -40  Slightly worsening dysarthria today. Failed speech eval yesterday. States still has perioral numbness   Past Medical History  Diagnosis Date  . Stroke     Past Surgical History  Procedure Laterality Date  . Cholecystectomy    . Finger surgery      No family history on file.  Social History:  reports that she has been smoking Cigarettes.  She has been smoking about 2.00 packs per day. She does not have any smokeless tobacco history on file. She reports that she does not drink alcohol or use illicit drugs.  No Known Allergies  Medications: I have reviewed the patient's current medications.  Physical Examination: Blood pressure 175/94, pulse 90, temperature 98.4 F (36.9 C), temperature source Oral, resp. rate 18, height  (1.549 m), weight 65.318 kg (144 lb), last menstrual period 05/26/2015, SpO2 99 %.    Neurological Examination Mental Status: Alert, oriented, thought content appropriate.  Speech fluent without evidence of aphasia.  Able to follow 3 step commands without difficulty. Cranial Nerves: II: Discs flat bilaterally; Visual fields grossly normal, pupils equal, round, reactive to light and accommodation III,IV, VI: ptosis not present, extra-ocular motions intact bilaterally V,VII: small R facial droop. And perioral numbness.   VIII: hearing normal bilaterally IX,X: gag reflex present XI: bilateral shoulder shrug XII: midline tongue extension Motor: Right : Upper  extremity   4+/5    Left:     Upper extremity  4+/5  Lower extremity   4/5     Lower extremity   4/5 Tone and bulk:normal tone throughout; no atrophy noted Sensory: Pinprick and light touch intact throughout, bilaterally Deep Tendon Reflexes: absent reflexes in there lower extremities Plantars: Right: downgoing   Left: downgoing Cerebellar: normal finger-to-nose, normal rapid alternating movements and normal heel-to-shin test Gait: not tested.        Laboratory Studies:   Basic Metabolic Panel:  Recent Labs Lab 05/28/15 1259 05/28/15 2211 05/29/15 0612 05/31/15 0521 06/01/15 0343  NA 134*  --  135 134* 136  K 3.7  --  3.3* 3.6 3.6  CL 102  --  104 103 107  CO2 22  --  GLUCOSE 114*  --  103* 101* 112*  BUN 23*  --  13 23* 27*  CREATININE 0.69 0.56 0.44 0.57 0.52  CALCIUM 9.6  --  8.8* 9.0 8.7*    Liver Function Tests:  Recent Labs Lab 05/29/15 0612  AST 15  ALT 13*  ALKPHOS 67  BILITOT 0.9  PROT 7.6  ALBUMIN 3.7   No results for input(s): LIPASE, AMYLASE in the last 168 hours. No results for input(s): AMMONIA in the last 168 hours.  CBC:  Recent Labs Lab 05/28/15 1259 05/28/15 2211 05/29/15 0612 05/31/15 0521  WBC 17.7* 12.7* 8.9 4.5  HGB 18.8* 17.1* 17.2* 16.6*  HCT 54.9* 50.9* 49.9* 49.4*  MCV 91.2 91.9 91.4 92.2  PLT 277 224 199 179    Cardiac Enzymes:  Recent Labs Lab 05/28/15 2211  CKTOTAL 53    BNP: Invalid input(s): POCBNP  CBG:  Recent Labs Lab 05/28/15 2101 05/30/15 2123  GLUCAP 107* 113*    Microbiology: Results for orders placed or performed during the hospital encounter of 05/28/15  Urine culture     Status: None   Collection Time: 05/28/15  3:25 AM  Result Value Ref Range Status   Specimen Description URINE, CLEAN CATCH  Final   Special Requests NONE  Final   Culture MULTIPLE SPECIES PRESENT, SUGGEST RECOLLECTION  Final   Report Status 05/30/2015 FINAL  Final  CSF culture     Status: None   Collection  Time: 05/28/15  3:48 PM  Result Value Ref Range Status   Specimen Description CSF  Final   Special Requests Normal  Final   Gram Stain NO BACTERIA SEEN RARE WBC CONFIRMED BY TFK   Final   Culture NO GROWTH 3 DAYS  Final   Report Status 05/31/2015 FINAL  Final  MRSA PCR Screening     Status: None   Collection Time: 05/28/15  9:30 PM  Result Value Ref Range Status   MRSA by PCR NEGATIVE NEGATIVE Final    Comment:        The GeneXpert MRSA Assay (FDA approved for NASAL specimens only), is one component of a comprehensive MRSA colonization surveillance program. It is not intended to diagnose MRSA infection nor to guide or monitor treatment for MRSA infections.     Coagulation Studies: No results for input(s): LABPROT, INR in the last 72 hours.  Urinalysis:   Recent Labs Lab 05/28/15 0325  COLORURINE AMBER*  LABSPEC 1.031*  PHURINE 5.0  GLUCOSEU NEGATIVE  HGBUR 3+*  BILIRUBINUR NEGATIVE  KETONESUR 1+*  PROTEINUR >500*  NITRITE NEGATIVE  LEUKOCYTESUR TRACE*    Lipid Panel:  No results found for: CHOL, TRIG, HDL, CHOLHDL, VLDL, LDLCALC  HgbA1C: No results found for: HGBA1C  Urine Drug Screen:  No results found for: LABOPIA, COCAINSCRNUR, LABBENZ, AMPHETMU, THCU, LABBARB  Alcohol Level: No results for input(s): ETH in the last 168 hours.  Other results: EKG: normal EKG, normal sinus rhythm, unchanged from previous tracings.  Imaging: No results found.   Assessment/Plan: 39 y.o. female  with a known history of stroke, chronic right-sided weakness is present into the ED with a chief complaint of generalized weakness that started a week prior and has been progressing. No prior history of similar symptoms. Concern for GBS. Pt is s/p LP with clear cytoalbuminological dissociation (elevated protein with normal cell count). MRI C and T spine no signs of transverse myelitis and inflamed roots.  Pt states she was able to ambulate with limited assistance along with  PT Unsure why pt has perioral numbness and no respiratory compromise.    - con't IVIG for 7 days - I want to make sure pt does not have diagnosis if Christianne Dolin variant of GBS with bulbar involvement.  I did call the lab and asked to for CFS of anti-GQ1b antibody which is sent out lab - if pt does not improve or continues to fail swallow eval, possibility of transferring to outside facility for EMG/NCS and plasmapheresis.    Pauletta Browns  06/01/2015, 9:21 AM

## 2015-06-01 NOTE — Progress Notes (Signed)
[facility] - [place] at [facility]   PATIENT NAME: Alexandria Murray    MR#:  [id number]  DATE OF BIRTH:  19-Oct-1976  SUBJECTIVE:seen at bedside.admitted GB syndrome.on IV IG ,pt mstill feels heavy in legs,righ facial numbness.her NIF is -35 cm.  CHIEF COMPLAINT:   Chief Complaint  Patient presents with  . Weakness   lower extremity weakness is getting better. Still reporting  of perioral numbness but passed swallowing evaluation study today    REVIEW OF SYSTEMS:    Review of Systems  Constitutional: Negative for fever and chills.  HENT: Negative for hearing loss.   Eyes: Negative for blurred vision, double vision and photophobia.  Respiratory: Negative for cough, hemoptysis and shortness of breath.   Cardiovascular: Negative for palpitations, orthopnea and leg swelling.  Gastrointestinal: Negative for vomiting, abdominal pain and diarrhea.  Genitourinary: Negative for dysuria, urgency, frequency and hematuria.  Musculoskeletal: Negative for myalgias and neck pain.  Skin: Negative for rash.  Neurological: Positive for sensory change and focal weakness. Negative for dizziness, seizures, weakness and headaches.        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 The patient does not have insomnia.     Nutrition:  Nothing by mouth Failed swallow evaluation      DRUG ALLERGIES:  No Known Allergies  VITALS:  Blood pressure 128/95, pulse 105, temperature 98.4 F (36.9 C), temperature source Oral, resp. rate 20, height 5\' 1"  (1.549 m), weight 65.318 kg (144 lb), last menstrual period 05/26/2015, SpO2 100 %.  PHYSICAL EXAMINATION:   Physical Exam  Constitutional: She is oriented to person, place, and time.  Neurological: She is alert and oriented to person, place, and time. No cranial nerve deficit.  Decreased power in both legs    GENERAL:  39 y.o.-year-old patient  lying in the bed with no acute distress.  EYES: Pupils equal, round, reactive to light and accommodation. No scleral icterus. Extraocular muscles intact.  HEENT: Head atraumatic, normocephalic. Oropharynx and nasopharynx clear.  NECK:  Supple, no jugular venous distention. No thyroid enlargement, no tenderness.  LUNGS: Normal breath sounds bilaterally, no wheezing, rales,rhonchi or crepitation. No use of accessory muscles of respiration.  CARDIOVASCULAR: S1, S2 normal. No murmurs, rubs, or gallops.  ABDOMEN: Soft, nontender, nondistended. Bowel sounds present. No organomegaly or mass.  EXTREMITIES: No pedal edema, cyanosis, or clubbing.  NEUROLOGIC: Cranial nerves II through XII are intact. Muscle strength 5 out of 5 in lower extremities bilaterally and 4/5 in upper extremities. Decreased light touch sensation in the upper extremities. Gait not checked.  PSYCHIATRIC: The patient is alert and oriented x 3.  SKIN: No obvious rash, lesion, or ulcer.    LABORATORY PANEL:   CBC  Recent Labs Lab 05/31/15 0521  WBC 4.5  HGB 16.6*  HCT 49.4*  PLT 179   ------------------------------------------------------------------------------------------------------------------  Chemistries   Recent Labs Lab 05/29/15 0612  06/01/15 0343  NA 135  < > 136  K 3.3*  < > 3.6  CL 104  < > 107  CO2 23  < > 23  GLUCOSE 103*  < > 112*  BUN 13  < > 27*  CREATININE 0.44  < > 0.52  CALCIUM 8.8*  < > 8.7*  AST 15  --   --   ALT 13*  --   --   ALKPHOS 67  --   --   BILITOT 0.9  --   --   < > =  values in this interval not displayed. ------------------------------------------------------------------------------------------------------------------  Cardiac Enzymes No results for input(s): TROPONINI in the last 168 hours. ------------------------------------------------------------------------------------------------------------------  RADIOLOGY:  No results found.   ASSESSMENT AND PLAN:   Active  Problems:   Guillain Barr syndrome   Smoker  1. GB syndrome;   -clinically stable today  Discussed with neurology, will Continue IV IG for a total of 7 days NIF every 12 hrs, Continue physical therapy and occupational therapy May consider plasmapheresis if there is no improvement after 7 days of IVIG Past repeat  swallow evaluation today- speech has recommended to start the patient on pure diet with thin liquids  ivf - d5 ns Will continue for now and will  consider discontinuing if she is tolerating diet -ordered  anti-GQ1b antibody - CSF which is sent out lab to make sure pt does not have diagnosis if Christianne Dolin variant of GBS with bulbar involvement per neurology recommendation - if pt does not improve or continues to fail swallow eval, possibility of transferring to outside facility for EMG/NCS and plasmapheresis.   2.cystitis   IV rocpehin discontinued  follow cultures-probably contaminated specimen and it has multiple species  3. Copd no wheezing Nebulizer treatments as needed basis  ,h/o tobacco abuse-counseled patient to quit smoking  4.htn;controlled Monitor closely for possible respiratory depression related to GB sndrome       All the records are reviewed and case discussed with Care Management/Social Workerr. Management plans discussed with the patient, family and neurologythey are in agreement.  CODE STATUS:full  TOTAL TIME TAKING CARE OF THIS PATIENT: Reviewing medical records, follow-up visit , new orders and coordination of care 35 min   POSSIBLE D/C IN1-2 DAYS, DEPENDING ON CLINICAL CONDITION.   Ramonita Lab M.D on 06/01/2015 at 2:13 PM  Between 7am to 6pm - Pager - [phone]  After 6pm go to [email or web address] - password [email or web address] [facility]  [facility]  Office  [phone]  CC: Primary care physician; No PCP Per Patient

## 2015-06-01 NOTE — Progress Notes (Signed)
Speech Language Pathology Treatment: Dysphagia  Patient Details Name: Alexandria Murray MRN: [id number] DOB: Sep 18, 1976 Today's Date: 06/01/2015 Time: 7829-5621 SLP Time Calculation (min) (ACUTE ONLY): 45 min  Assessment / Plan / Recommendation Clinical Impression   Pt presents w/ min. increased risk for aspiration sec. to min. oral phase deficits and the reduce bolus control she feels she has w/ trials. This can impact the pharyngeal phase and increases risk for bolus material to slip into the pharynx prematurely and the risk for bolus material to be aspirated. Noted improved overall oropharyngeal phase functioning today w/ increased laryngeal excursion during volitional swallow; po trials. Gave frequent verbal encouragement to pt that she was attending well to the boluses as she took them and gave the appropriate effort for A-P transfer and clearing. Pt consumed single and consecutive sips of thin liquids via cup; 1/2 tsp boluses of purees placed anteriorly in the mouth but past the front teeth - in order to break pt's habit of slurping from the spoon. Pt was able to hold the cup independently for drinking but required min. Feeding assistance w/ food/utensil d/t UE weakness from the GBS. Pt presents w/ min. anxiousness about her swallowing but seemed pleased she was doing better today w/ po trials. Encouragement appeared beneficial. Due to her increased oral phase deficits and min. risk for aspiration at this time, rec. Dys. I diet w/ thin liquids w/ meds crushed in applesauce; single ice chips for pleasure; strict aspiration precautions - stopping po's if increased coughing noted. Rec. Continued oral care for stim and hygiene. ST will toleration of diet next 1-3 days w/ trials to upgrade. Pt agreed.         HPI Other Pertinent Information: Pt is a 39 y.o. female with a known history of stroke, chronic right-sided weakness, bilateral pain in shoulders who presented to the ED with a chief complaint of  generalized weakness that started a week prior and has been progressing. No prior history of similar symptoms. Concern for GBS in setting of a viral infection 2 weeks prior. Pt is s/p LP with clear cytoalbuminological dissociation (elevated protein with normal cell count). MRI C and T spine no signs of transverse myelitis and inflamed roots. Pt is having increased bulbar s/s w/ worsening swallowing and speech per pt report than yesterday even. Pt is NPO w/ pleasure po's of ice and applesauce w/ precautions, however, pt refused stating she was "too nervous about it". Pt verbally conversed w. SLP; her speech was slightly improved since yesterday in her articulation - less precision w/ labial sounds sec. to decreased contact/strength/ROM.    Pertinent Vitals Pain Assessment: No/denies pain ("my shsoulders feel better today")  SLP Plan  Continue with current plan of care    Recommendations Diet recommendations: Dysphagia 1 (puree);Thin liquid (no straws) Liquids provided via: Cup Medication Administration: Crushed with puree Supervision: Patient able to self feed;Staff to assist with self feeding;Full supervision/cueing for compensatory strategies Compensations: Slow rate;Small sips/bites;Check for pocketing;Multiple dry swallows after each bite/sip Postural Changes and/or Swallow Maneuvers: Seated upright 90 degrees              General recommendations: Rehab consult Oral Care Recommendations: Oral care BID;Staff/trained caregiver to provide oral care Follow up Recommendations:  (TBD) Plan: Continue with current plan of care    GO    Jerilynn Som, MS, CCC-SLP  Paytin Ramakrishnan 06/01/2015, 2:50 PM

## 2015-06-01 NOTE — Progress Notes (Signed)
nif-40 x 3

## 2015-06-01 NOTE — Plan of Care (Signed)
Problem: SLP Dysphagia Goals Goal: Misc Dysphagia Goal Pt will safely tolerate po diet of least restrictive consistency w/ no overt s/s of aspiration noted by Staff/pt/family x3 sessions.    

## 2015-06-01 NOTE — Progress Notes (Signed)
NIF -40 

## 2015-06-02 ENCOUNTER — Inpatient Hospital Stay: Payer: Managed Care, Other (non HMO)

## 2015-06-02 DIAGNOSIS — G510 Bell's palsy: Secondary | ICD-10-CM

## 2015-06-02 LAB — CBC
HCT: 42.4 % (ref 35.0–47.0)
Hemoglobin: 15 g/dL (ref 12.0–16.0)
MCH: 31.7 pg (ref 26.0–34.0)
MCHC: 35.4 g/dL (ref 32.0–36.0)
MCV: 89.5 fL (ref 80.0–100.0)
PLATELETS: 146 10*3/uL — AB (ref 150–440)
RBC: 4.74 MIL/uL (ref 3.80–5.20)
RDW: 12.8 % (ref 11.5–14.5)
WBC: 5 10*3/uL (ref 3.6–11.0)

## 2015-06-02 MED ORDER — IMMUNE GLOBULIN (HUMAN) 5 GM/50ML IV SOLN
400.0000 mg/kg | INTRAVENOUS | Status: AC
  Administered 2015-06-02: 25 g via INTRAVENOUS
  Administered 2015-06-03: 5 g via INTRAVENOUS
  Filled 2015-06-02 (×2): qty 50

## 2015-06-02 NOTE — Progress Notes (Signed)
Spoke with Dr. Betti Cruz aboyut pt BP. MD is aware pt BP has been trending high all shift. Per Md recheck BP in another hour and call back if pt BP has not improved

## 2015-06-02 NOTE — Consult Note (Addendum)
CC: weakness in lower extremities   HPI: Alexandria Murray is an 39 y.o. female  with a known history of stroke, chronic right-sided weakness is present into the ED with a chief complaint of generalized weakness that started a week prior and has been progressing. No prior history of similar symptoms. Concern for GBS. Pt is s/p LP with clear cytoalbuminological dissociation (elevated protein with normal cell count). MRI C and T spine no signs of transverse myelitis and inflamed roots.   Niff at -40  Passed swallow eval, worsening bulbar symptoms but likely from R sided Bell's Palsy.    Past Medical History  Diagnosis Date  . Stroke     Past Surgical History  Procedure Laterality Date  . Cholecystectomy    . Finger surgery      No family history on file.  Social History:  reports that she has been smoking Cigarettes.  She has been smoking about 2.00 packs per day. She does not have any smokeless tobacco history on file. She reports that she does not drink alcohol or use illicit drugs.  No Known Allergies  Medications: I have reviewed the patient's current medications.  Physical Examination: Blood pressure 134/93, pulse 100, temperature 98.1 F (36.7 C), temperature source Oral, resp. rate 19, height  (1.549 m), weight 65.318 kg (144 lb), last menstrual period 05/26/2015, SpO2 98 %.    Neurological Examination Mental Status: Alert, oriented, thought content appropriate.  Speech fluent without evidence of aphasia.  Able to follow 3 step commands without difficulty. Cranial Nerves: II: Discs flat bilaterally; Visual fields grossly normal, pupils equal, round, reactive to light and accommodation III,IV, VI: R eye difficulty closing V,VII: small R facial droop.  VIII: hearing normal bilaterally IX,X: gag reflex present XI: bilateral shoulder shrug XII: midline tongue extension Motor: Right : Upper extremity   4+/5    Left:     Upper extremity  4+/5  Lower extremity    4+/5     Lower extremity   4+/5 Tone and bulk:normal tone throughout; no atrophy noted Sensory: Pinprick and light touch intact throughout, bilaterally Deep Tendon Reflexes: absent reflexes in there lower extremities Plantars: Right: downgoing   Left: downgoing Cerebellar: normal finger-to-nose, normal rapid alternating movements and normal heel-to-shin test Gait: not tested.        Laboratory Studies:   Basic Metabolic Panel:  Recent Labs Lab 05/28/15 1259 05/28/15 2211 05/29/15 0612 05/31/15 0521 06/01/15 0343  NA 134*  --  135 134* 136  K 3.7  --  3.3* 3.6 3.6  CL 102  --  104 103 107  CO2 22  --  GLUCOSE 114*  --  103* 101* 112*  BUN 23*  --  13 23* 27*  CREATININE 0.69 0.56 0.44 0.57 0.52  CALCIUM 9.6  --  8.8* 9.0 8.7*    Liver Function Tests:  Recent Labs Lab 05/29/15 0612  AST 15  ALT 13*  ALKPHOS 67  BILITOT 0.9  PROT 7.6  ALBUMIN 3.7   No results for input(s): LIPASE, AMYLASE in the last 168 hours. No results for input(s): AMMONIA in the last 168 hours.  CBC:  Recent Labs Lab 05/28/15 1259 05/28/15 2211 05/29/15 0612 05/31/15 0521 06/02/15 1101  WBC 17.7* 12.7* 8.9 4.5 5.0  HGB 18.8* 17.1* 17.2* 16.6* 15.0  HCT 54.9* 50.9* 49.9* 49.4* 42.4  MCV 91.2 91.9 91.4 92.2 89.5  PLT 277 224 199 179 146*    Cardiac Enzymes:  Recent Labs Lab 05/28/15 2211  CKTOTAL 53    BNP: Invalid input(s): POCBNP  CBG:  Recent Labs Lab 05/28/15 2101 05/30/15 2123  GLUCAP 107* 113*    Microbiology: Results for orders placed or performed during the hospital encounter of 05/28/15  Urine culture     Status: None   Collection Time: 05/28/15  3:25 AM  Result Value Ref Range Status   Specimen Description URINE, CLEAN CATCH  Final   Special Requests NONE  Final   Culture MULTIPLE SPECIES PRESENT, SUGGEST RECOLLECTION  Final   Report Status 05/30/2015 FINAL  Final  CSF culture     Status: None   Collection Time: 05/28/15  3:48 PM   Result Value Ref Range Status   Specimen Description CSF  Final   Special Requests Normal  Final   Gram Stain NO BACTERIA SEEN RARE WBC CONFIRMED BY TFK   Final   Culture NO GROWTH 3 DAYS  Final   Report Status 05/31/2015 FINAL  Final  MRSA PCR Screening     Status: None   Collection Time: 05/28/15  9:30 PM  Result Value Ref Range Status   MRSA by PCR NEGATIVE NEGATIVE Final    Comment:        The GeneXpert MRSA Assay (FDA approved for NASAL specimens only), is one component of a comprehensive MRSA colonization surveillance program. It is not intended to diagnose MRSA infection nor to guide or monitor treatment for MRSA infections.     Coagulation Studies: No results for input(s): LABPROT, INR in the last 72 hours.  Urinalysis:   Recent Labs Lab 05/28/15 0325  COLORURINE AMBER*  LABSPEC 1.031*  PHURINE 5.0  GLUCOSEU NEGATIVE  HGBUR 3+*  BILIRUBINUR NEGATIVE  KETONESUR 1+*  PROTEINUR >500*  NITRITE NEGATIVE  LEUKOCYTESUR TRACE*    Lipid Panel:  No results found for: CHOL, TRIG, HDL, CHOLHDL, VLDL, LDLCALC  HgbA1C: No results found for: HGBA1C  Urine Drug Screen:  No results found for: LABOPIA, COCAINSCRNUR, LABBENZ, AMPHETMU, THCU, LABBARB  Alcohol Level: No results for input(s): ETH in the last 168 hours.  Other results: EKG: normal EKG, normal sinus rhythm, unchanged from previous tracings.  Imaging: No results found.   Assessment/Plan: 39 y.o. female  with a known history of stroke, chronic right-sided weakness is present into the ED with a chief complaint of generalized weakness that started a week prior and has been progressing. No prior history of similar symptoms. Concern for GBS. Pt is s/p LP with clear cytoalbuminological dissociation (elevated protein with normal cell count). MRI C and T spine no signs of transverse myelitis and inflamed roots.  Pt states she was able to ambulate with limited assistance along with PT  Strength much improved  in b/l LE, Pt has clear Bell's Palsy on the R side which was minor yesterday but now evident.   She is also complaining of L hand numbness.   Plan: - MRI C spine to make sure cord compression that could cause arm numbness - Tomorrow last day IVIG - Starting Monday 5-7 day medrol taper  - Rehab placement - Niff continues to be -40 can change it to q daily.   - GQ1b antibody is pending.    Pauletta Browns  06/02/2015, 3:14 PM

## 2015-06-02 NOTE — Progress Notes (Signed)
Pt BP improved slightly, MD paged and spoke with Dr. Betti Cruz. Per MD, continue to monitor and pass it on to the day rounding MD.

## 2015-06-02 NOTE — Progress Notes (Signed)
NIF -40 

## 2015-06-02 NOTE — Progress Notes (Signed)
bases

## 2015-06-02 NOTE — Progress Notes (Addendum)
[facility] - [place] at [facility]   PATIENT NAME: Alexandria Murray    MR#:  [id number]  DATE OF BIRTH:  April 16, 1976  SUBJECTIVE:seen at bedside.admitted GB syndrome.on IV IG ,pt mstill feels heavy in legs,righ facial numbness.her NIF is -35 cm.  CHIEF COMPLAINT:   Chief Complaint  Patient presents with  . Weakness   lower extremity weakness is getting better. Still reporting  of perioral numbness but passed swallowing evaluation study yesterday. Admits of having right facial weakness including eye inability to close eye during sleep, complains of left hand numbness, or than the right hand numbness   REVIEW OF SYSTEMS:    Review of Systems  Constitutional: Negative for fever and chills.  HENT: Negative for hearing loss.   Eyes: Negative for blurred vision, double vision and photophobia.  Respiratory: Negative for cough, hemoptysis and shortness of breath.   Cardiovascular: Negative for palpitations, orthopnea and leg swelling.  Gastrointestinal: Negative for vomiting, abdominal pain and diarrhea.  Genitourinary: Negative for dysuria, urgency, frequency and hematuria.  Musculoskeletal: Negative for myalgias and neck pain.  Skin: Negative for rash.  Neurological: Positive for sensory change and focal weakness. Negative for dizziness, seizures, weakness and headaches.        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 The patient does not have insomnia.     Nutrition:  Nothing by mouth Failed swallow evaluation      DRUG ALLERGIES:  No Known Allergies  VITALS:  Blood pressure 134/93, pulse 100, temperature 98.1 F (36.7 C), temperature source Oral, resp. rate 19, height 5\' 1"  (1.549 m), weight 65.318 kg (144 lb), last menstrual period 05/26/2015, SpO2 98 %.  PHYSICAL EXAMINATION:   Physical Exam  Constitutional: She is oriented to person, place, and time.   Neurological: She is alert and oriented to person, place, and time. No cranial nerve deficit.  Decreased power in both legs    GENERAL:  39 y.o.-year-old patient lying in the bed with no acute distress.  EYES: Pupils equal, round, reactive to light and accommodation. No scleral icterus. Extraocular muscles intact.  HEENT: Head atraumatic, normocephalic. Oropharynx and nasopharynx clear.  NECK:  Supple, no jugular venous distention. No thyroid enlargement, no tenderness.  LUNGS: Normal breath sounds bilaterally, no wheezing, rales,rhonchi or crepitation. No use of accessory muscles of respiration.  CARDIOVASCULAR: S1, S2 normal. No murmurs, rubs, or gallops.  ABDOMEN: Soft, nontender, nondistended. Bowel sounds present. No organomegaly or mass.  EXTREMITIES: No pedal edema, cyanosis, or clubbing.  NEUROLOGIC: . Muscle strength 4 out of 5 in lower extremities bilaterally and 4/5 in upper extremities. Decreased light touch sensation in the upper extremities. Gait not checked. Right facial weakness including upper face PSYCHIATRIC: The patient is alert and oriented x 3.  SKIN: No obvious rash, lesion, or ulcer.    LABORATORY PANEL:   CBC  Recent Labs Lab 06/02/15 1101  WBC 5.0  HGB 15.0  HCT 42.4  PLT 146*   ------------------------------------------------------------------------------------------------------------------  Chemistries   Recent Labs Lab 05/29/15 0612  06/01/15 0343  NA 135  < > 136  K 3.3*  < > 3.6  CL 104  < > 107  CO2 23  < > 23  GLUCOSE 103*  < > 112*  BUN 13  < > 27*  CREATININE 0.44  < > 0.52  CALCIUM 8.8*  < > 8.7*  AST 15  --   --   ALT 13*  --   --  ALKPHOS 67  --   --   BILITOT 0.9  --   --   < > = values in this interval not displayed. ------------------------------------------------------------------------------------------------------------------  Cardiac Enzymes No results for input(s): TROPONINI in the last 168  hours. ------------------------------------------------------------------------------------------------------------------  RADIOLOGY:  No results found.   ASSESSMENT AND PLAN:   Active Problems:   Guillain Barr syndrome   Smoker  1. GB syndrome;   Discussed with neurology, will Continue IV IG for a total of 7 days, last day tomorrow NIF every 12 hrs, which is stable Continue physical therapy and occupational therapy May consider plasmapheresis if there is no improvement after 7 days of IVIG Past repeat  swallow evaluation today- speech has recommended to start the patient on pure diet with thin liquids  ivf - d5 ns Will continue for now and will  consider discontinuing if she is tolerating diet -ordered  anti-GQ1b antibody - blood which is sent out lab to make sure pt does not have diagnosis if Christianne Dolin variant of GBS with bulbar involvement per neurology recommendation - if pt does not improve or continues to fail swallow eval, possibility of transferring to outside facility for EMG/NCS and plasmapheresis. Patient is exhibiting facial weakness, concerning for Bell's palsy. She also has symptoms in her upper extremities. We will be getting cervical spine MRI to rule out pathology.   2.cystitis   IV rocpehin discontinued  follow cultures-probably contaminated specimen and it has multiple species  3. Copd no wheezing Nebulizer treatments as needed basis  ,h/o tobacco abuse-counseled patient to quit smoking  4.htn;controlled Monitor closely for possible respiratory depression related to GB sndrome  5 L . Shoulder pain, questionable rotator cuff injury per emergency room notes. We will be getting orthopedic surgeon's input and cervical spine MRI will be performed as well.      All the records are reviewed and case discussed with Care Management/Social Workerr. Management plans discussed with the patient, family and neurologythey are in agreement.  CODE  STATUS:full  TOTAL TIME TAKING CARE OF THIS PATIENT: Reviewing medical records, follow-up visit , new orders and coordination of care 35 min , discussed with Dr. Loretha Brasil. Prolonged discussion this patient's husband. All questions answered time spent approximately 10 more minutes POSSIBLE D/C IN1-2 DAYS, DEPENDING ON CLINICAL CONDITION.   Katharina Caper M.D on 06/02/2015 at 2:44 PM  Between 7am to 6pm - Pager - [phone]  After 6pm go to [email or web address] - password [email or web address] [facility]  [facility]  Office  [phone]  CC: Primary care physician; No PCP Per Patient

## 2015-06-02 NOTE — Progress Notes (Signed)
SLP Cancellation Note  Patient Details Name: Alexandria Murray MRN: [id number] DOB: [birth year]   Cancelled treatment:       Reason Eval/Treat Not Completed: Patient at procedure or test/unavailable Attempted dysphagia treatment, however pt was currently out of room. Per nsg pt's husband had taken pt outside. Nsg reports that pt has been tolerating diet well. Will f/u in 1-2 days.    [place] 06/02/2015, 12:13 PM

## 2015-06-02 NOTE — Progress Notes (Signed)
Physical Therapy Treatment Patient Details Name: ZINEB GLADE MRN: [id number] DOB: May 10, 1976 Today's Date: 06/02/2015    History of Present Illness Pt is a 39 year-old female admitted to the hospital for suspected Guillaine-Barre Syndrome     PT Comments    Pt reports fatigue today and pain in L shoulder. Pt notes she is motivated and wants to push through, but is unsure of how well she will do today. Pt increased ambulation distance for each walk, but performed less walks. Improved ability to perform LE exercises actively; requires rest breaks every 5-7 repetitions. Pt requires continued PT for progression of strength, endurance, transfers and ambulation to improve functional mobility and return to prior level of function.  Follow Up Recommendations  CIR     Equipment Recommendations  Rolling walker with 5" wheels    Recommendations for Other Services       Precautions / Restrictions Precautions Precautions: Fall Restrictions Weight Bearing Restrictions: No    Mobility  Bed Mobility Overal bed mobility: Needs Assistance Bed Mobility: Supine to Sit;Sit to Supine     Supine to sit: Min assist Sit to supine: Modified independent (Device/Increase time)   General bed mobility comments: Repositions self in bed with rails and LE use  Transfers Overall transfer level: Needs assistance Equipment used: Rolling walker (2 wheeled) Transfers: Sit to/from Stand Sit to Stand: Min assist         General transfer comment: Cues for hand placement with sit to stand; uses B hands on rw despite cues  Ambulation/Gait Ambulation/Gait assistance: Min guard Ambulation Distance (Feet): 42 Feet (3 sets of 14) Assistive device: Rolling walker (2 wheeled) Gait Pattern/deviations: Step-through pattern;Decreased stride length (slow, cautions, B knees slightly flexed in stance phase) Gait velocity: decreased  Gait velocity interpretation: <1.8 ft/sec, indicative of risk for recurrent  falls     Stairs            Wheelchair Mobility    Modified Rankin (Stroke Patients Only)       Balance Overall balance assessment: History of Falls                                  Cognition Arousal/Alertness: Awake/alert Behavior During Therapy: WFL for tasks assessed/performed Overall Cognitive Status: Within Functional Limits for tasks assessed                      Exercises General Exercises - Lower Extremity Ankle Circles/Pumps: AROM;Both;20 reps;Supine Quad Sets: Strengthening;Both;20 reps;Supine Gluteal Sets: Strengthening;Both;20 reps;Supine Short Arc Quad: AROM;Both;20 reps;Supine Heel Slides: AROM;Both;20 reps;Supine (limited range on R) Hip ABduction/ADduction: AROM;Both;20 reps;Supine (limited on R; requires rest breaks) Straight Leg Raises: AAROM;Both;20 reps;Supine    General Comments        Pertinent Vitals/Pain Pain Assessment: 0-10 Pain Score: 5  Pain Location: L shoulder    Home Living                      Prior Function            PT Goals (current goals can now be found in the care plan section) Progress towards PT goals: Progressing toward goals    Frequency  7X/week    PT Plan Current plan remains appropriate    Co-evaluation             End of Session Equipment Utilized During Treatment: Gait belt Activity Tolerance: Patient limited  by fatigue (Limited by weakness) Patient left: in bed;with call bell/phone within reach;with bed alarm set     Time: 1610-9604 PT Time Calculation (min) (ACUTE ONLY): 32 min  Charges:  $Gait Training: 8-22 mins $Therapeutic Exercise: 8-22 mins                    G Codes:      Kristeen Miss 06/02/2015, 2:00 PM

## 2015-06-03 NOTE — Progress Notes (Signed)
NIF performed with good effort... Achieved -40.

## 2015-06-03 NOTE — Progress Notes (Signed)
Physical Therapy Treatment Patient Details Name: Alexandria Murray MRN: [id number] DOB: Mar 18, 1976 Today's Date: 06/03/2015    History of Present Illness Pt is a 39 year-old female admitted to the hospital for suspected Guillaine-Barre Syndrome     PT Comments    Pt is able to ambulate much better able today than any previous attempt.  She shows great motivation, is eager to work hard with PT and continues to improve strength and ambulation.   Follow Up Recommendations  Home health PT (pt did much better today and likely could go home safely)     Equipment Recommendations  Rolling walker with 5" wheels    Recommendations for Other Services       Precautions / Restrictions Precautions Precautions: Fall Restrictions Weight Bearing Restrictions: No    Mobility  Bed Mobility Overal bed mobility: Modified Independent Bed Mobility: Supine to Sit;Sit to Supine     Supine to sit: Min guard Sit to supine: Min guard      Transfers Overall transfer level: Needs assistance Equipment used: Rolling walker (2 wheeled) Transfers: Sit to/from Stand Sit to Stand: Min guard            Ambulation/Gait Ambulation/Gait assistance: Min assist;Min guard Ambulation Distance (Feet): 200 Feet Assistive device: Rolling walker (2 wheeled)       General Gait Details: Pt initially did not feel like she could walk very far, but ended up doing the whole loop and though she has some fatigue and rare occasional knee buckling.  She is highly motivated and stays fairly consistent with cadence during ambulation.    Stairs            Wheelchair Mobility    Modified Rankin (Stroke Patients Only)       Balance                                    Cognition Arousal/Alertness: Awake/alert Behavior During Therapy: WFL for tasks assessed/performed Overall Cognitive Status: Within Functional Limits for tasks assessed                      Exercises General  Exercises - Lower Extremity Ankle Circles/Pumps: AROM;Both;20 reps;Supine Quad Sets: Strengthening;Both;20 reps;Supine Heel Slides: AROM;Both;20 reps;Supine Hip ABduction/ADduction: AROM;Both;20 reps;Supine Straight Leg Raises: AAROM;Both;20 reps;Supine    General Comments        Pertinent Vitals/Pain Pain Assessment: No/denies pain    Home Living                      Prior Function            PT Goals (current goals can now be found in the care plan section) Progress towards PT goals: Progressing toward goals    Frequency  7X/week    PT Plan Current plan remains appropriate    Co-evaluation             End of Session Equipment Utilized During Treatment: Gait belt Activity Tolerance: Patient tolerated treatment well (has some fatigue, generally does better than she expected) Patient left: with chair alarm set     Time: 4098-1191 PT Time Calculation (min) (ACUTE ONLY): 28 min  Charges:  $Gait Training: 8-22 mins $Therapeutic Exercise: 8-22 mins                    G Codes:     Loran Senters, PT, DPT [phone]  Malachi Pro 06/03/2015, 5:35 PM

## 2015-06-03 NOTE — Plan of Care (Signed)
Problem: Discharge Progression Outcomes Goal: Other Discharge Outcomes/Goals Outcome: Progressing Plan of Care Progress to Goal:   Pt BP was elevated this am. Pt report toradol is most effective for pain relief for her. No other signs of distress noted. Continue to monitor.

## 2015-06-03 NOTE — Consult Note (Signed)
ORTHOPAEDIC CONSULTATION  REQUESTING PHYSICIAN: Katharina Caper, MD  Chief Complaint: Left shoulder pain following injury at work  HPI: Alexandria Murray is a 39 y.o. female who complains of  left shoulder pain which began on 05/22/2015 after an injury at work. Patient works for [employer]. She states that she was swinging a 4 pound hammer and developed acute pain in the left shoulder while swinging a hammer. Patient presented to the emergency room after the injury. Patient is left-hand dominant. She states that she has not had previous pain in the left shoulder.  Patient has a history of stroke. She has had weakness in the bilateral lower extremities, right upper extremity weakness as well as numbness and tingling. She has already been seen by neurology. An MRI of the cervical, thoracic and lumbar spine have been performed. The cervical MRI has shown a central disc extrusion at C4-5 with moderate cord flattening. She has a left-sided foraminal narrowing contributing to the left C5 nerve root compression. The thoracic and lumbar MRI shows diffuse enhancement of the nerve roots of the cauda equina diagnostic of Guillain-Barr syndrome. She has degenerative disc disease at L4-5 and L5-S1 with disc herniations.  Past Medical History  Diagnosis Date  . Stroke    Past Surgical History  Procedure Laterality Date  . Cholecystectomy    . Finger surgery     History   Social History  . Marital Status: Married    Spouse Name: N/A  . Number of Children: N/A  . Years of Education: N/A   Social History Main Topics  . Smoking status: Current Every Day Smoker -- 2.00 packs/day    Types: Cigarettes  . Smokeless tobacco: Not on file  . Alcohol Use: No  . Drug Use: No  . Sexual Activity: Not on file   Other Topics Concern  . None   Social History Narrative   No family history on file. No Known Allergies Prior to Admission medications   Medication Sig Start Date End Date Taking? Authorizing Provider   acetaminophen (TYLENOL) 500 MG tablet Take 1,000 mg by mouth every 6 (six) hours as needed for mild pain.   Yes Historical Provider, MD  Aspirin-Acetaminophen-Caffeine (PAMPRIN MAX PO) Take 2 tablets by mouth daily.   Yes Historical Provider, MD  ferrous sulfate 325 (65 FE) MG tablet Take 325 mg by mouth daily.   Yes Historical Provider, MD  ibuprofen (ADVIL,MOTRIN) 600 MG tablet Take 600 mg by mouth every 6 (six) hours as needed for mild pain.   Yes Historical Provider, MD  Multiple Vitamins-Minerals (MULTIVITAMIN PO) Take 1 tablet by mouth daily.   Yes Historical Provider, MD  ondansetron (ZOFRAN ODT) 4 MG disintegrating tablet Take 1 tablet (4 mg total) by mouth every 8 (eight) hours as needed for nausea or vomiting. 05/24/15  Yes Gayla Doss, MD  oxyCODONE-acetaminophen (PERCOCET/ROXICET) 5-325 MG per tablet Take 1 tablet by mouth every 6 (six) hours as needed for severe pain. Patient not taking: Reported on 05/28/2015 05/22/15   Darci Current, MD  traMADol (ULTRAM) 50 MG tablet Take 1 tablet (50 mg total) by mouth every 8 (eight) hours as needed for moderate pain. Patient not taking: Reported on 05/28/2015 05/24/15 05/23/16  Gayla Doss, MD   Mr Cervical Spine Wo Contrast  06/02/2015   CLINICAL DATA:  Chronic BILATERAL hand weakness. This is worse on the RIGHT.  EXAM: MRI CERVICAL SPINE WITHOUT CONTRAST  TECHNIQUE: Multiplanar, multisequence MR imaging of the cervical spine was performed.  No intravenous contrast was administered.  COMPARISON:  None.  FINDINGS: Mild motion degradation.  Overall the study is diagnostic.  Alignment: Anatomic.  Vertebrae: No worrisome osseous lesion.  Cord: Moderate cord flattening at C4-5, with without abnormal cord signal.  Posterior Fossa: No tonsillar herniation.  Vertebral Arteries: BILATERAL patent.  RIGHT slightly larger.  Paraspinal tissues: No neck masses.  Disc levels:  The individual disc spaces were examined as follows:  C2-3:  Normal.  C3-4:  Normal.  C4-5:  Central disc extrusion. Moderate cord flattening with canal diameter 6-7 mm. LEFT-sided uncinate spurring along with disc material, slightly greater on the LEFT compress the LEFT C5 nerve root.  C5-6:  Normal.  C6-7: Shallow subligamentous protrusion centrally. Slight effacement anterior subarachnoid space. No significant cord compression or spinal stenosis.  C7-T1:  Normal.  IMPRESSION: Central disc extrusion at C4-5. Moderate cord flattening without abnormal cord signal. LEFT-sided foraminal narrowing contributes to LEFT C5 nerve root compression.  Shallow subligamentous protrusion centrally at C6-7. No neural impingement is seen.   Electronically Signed   By: Elsie Stain M.D.   On: 06/02/2015 21:51    Positive ROS: All other systems have been reviewed and were otherwise negative with the exception of those mentioned in the HPI and as above.  Physical Exam: General: Alert, no acute distress  MUSCULOSKELETAL: Left shoulder: Patient has no erythema or ecchymosis or swelling. There is no shoulder asymmetry or muscle atrophy. Patient has limited range of motion he can only forward elevate to approximately 70. His passive assistance she can achieve approximately 130 of flexion. She can actively abduct only possibly 40 but with passive assistance can achieve abduction of 90 before pain limits further motion. She has mild pain with impingement testing but no apprehension or instability. She has intact digital sensation. She is intact digital wrist and elbow motion. She has 2+ radial pulse.  Assessment: Left shoulder pain with limited motion following injury at work  Plan: Patient has had significant left shoulder pain and limitation of motion after a work injury on 05/22/15.  Examination of the symptoms indicate rotator cuff pathology. She may have a rotator cuff tear versus rotator cuff tendinitis and bursitis. It is possible she may have a SLAP tear.  I'm ordering an MR arthrogram the left shoulder  to evaluate for these diagnoses. I will follow up with the patient was status has been performed.   Juanell Fairly, MD    06/03/2015 7:15 PM

## 2015-06-03 NOTE — Progress Notes (Signed)
NIF x2 -40 cmh20. Patient tolerated well

## 2015-06-03 NOTE — Consult Note (Signed)
CC: weakness in lower extremities   HPI: Alexandria Murray is an 39 y.o. female  with a known history of stroke, chronic right-sided weakness is present into the ED with a chief complaint of generalized weakness that started a week prior and has been progressing. No prior history of similar symptoms. Concern for GBS. Pt is s/p LP with clear cytoalbuminological dissociation (elevated protein with normal cell count). MRI C and T spine no signs of transverse myelitis and inflamed roots.   MRI C spine significant for multi level disk changes with disk protrusion.    Niff at -40  Passed swallow eval, worsening bulbar symptoms but likely from R sided Bell's Palsy.    Past Medical History  Diagnosis Date  . Stroke     Past Surgical History  Procedure Laterality Date  . Cholecystectomy    . Finger surgery      No family history on file.  Social History:  reports that she has been smoking Cigarettes.  She has been smoking about 2.00 packs per day. She does not have any smokeless tobacco history on file. She reports that she does not drink alcohol or use illicit drugs.  No Known Allergies  Medications: I have reviewed the patient's current medications.  Physical Examination: Blood pressure 147/94, pulse 93, temperature 97.8 F (36.6 C), temperature source Oral, resp. rate 18, height 5\' 1"  (1.549 m), weight 65.318 kg (144 lb), last menstrual period 05/26/2015, SpO2 100 %.    Neurological Examination Mental Status: Alert, oriented, thought content appropriate.  Speech fluent without evidence of aphasia.  Able to follow 3 step commands without difficulty. Cranial Nerves: II: Discs flat bilaterally; Visual fields grossly normal, pupils equal, round, reactive to light and accommodation III,IV, VI: R eye difficulty closing V,VII: small R facial droop.  VIII: hearing normal bilaterally IX,X: gag reflex present XI: bilateral shoulder shrug XII: midline tongue extension Motor: Right  : Upper extremity   4+/5    Left:     Upper extremity  4+/5  Lower extremity   4+/5     Lower extremity   4+/5 Tone and bulk:normal tone throughout; no atrophy noted Sensory: Pinprick and light touch intact throughout, bilaterally Deep Tendon Reflexes: absent reflexes in there lower extremities Plantars: Right: downgoing   Left: downgoing Cerebellar: normal finger-to-nose, normal rapid alternating movements and normal heel-to-shin test Gait: not tested.        Laboratory Studies:   Basic Metabolic Panel:  Recent Labs Lab 05/28/15 1259 05/28/15 2211 05/29/15 0612 05/31/15 0521 06/01/15 0343  NA 134*  --  135 134* 136  K 3.7  --  3.3* 3.6 3.6  CL 102  --  104 103 107  CO2 22  --  23 23 23   GLUCOSE 114*  --  103* 101* 112*  BUN 23*  --  13 23* 27*  CREATININE 0.69 0.56 0.44 0.57 0.52  CALCIUM 9.6  --  8.8* 9.0 8.7*    Liver Function Tests:  Recent Labs Lab 05/29/15 0612  AST 15  ALT 13*  ALKPHOS 67  BILITOT 0.9  PROT 7.6  ALBUMIN 3.7   No results for input(s): LIPASE, AMYLASE in the last 168 hours. No results for input(s): AMMONIA in the last 168 hours.  CBC:  Recent Labs Lab 05/28/15 1259 05/28/15 2211 05/29/15 0612 05/31/15 0521 06/02/15 1101  WBC 17.7* 12.7* 8.9 4.5 5.0  HGB 18.8* 17.1* 17.2* 16.6* 15.0  HCT 54.9* 50.9* 49.9* 49.4* 42.4  MCV 91.2 91.9  91.4 92.2 89.5  PLT 277 224 199 179 146*    Cardiac Enzymes:  Recent Labs Lab 05/28/15 2211  CKTOTAL 53    BNP: Invalid input(s): POCBNP  CBG:  Recent Labs Lab 05/28/15 2101 05/30/15 2123  GLUCAP 107* 113*    Microbiology: Results for orders placed or performed during the hospital encounter of 05/28/15  Urine culture     Status: None   Collection Time: 05/28/15  3:25 AM  Result Value Ref Range Status   Specimen Description URINE, CLEAN CATCH  Final   Special Requests NONE  Final   Culture MULTIPLE SPECIES PRESENT, SUGGEST RECOLLECTION  Final   Report Status 05/30/2015 FINAL   Final  CSF culture     Status: None   Collection Time: 05/28/15  3:48 PM  Result Value Ref Range Status   Specimen Description CSF  Final   Special Requests Normal  Final   Gram Stain NO BACTERIA SEEN RARE WBC CONFIRMED BY TFK   Final   Culture NO GROWTH 3 DAYS  Final   Report Status 05/31/2015 FINAL  Final  MRSA PCR Screening     Status: None   Collection Time: 05/28/15  9:30 PM  Result Value Ref Range Status   MRSA by PCR NEGATIVE NEGATIVE Final    Comment:        The GeneXpert MRSA Assay (FDA approved for NASAL specimens only), is one component of a comprehensive MRSA colonization surveillance program. It is not intended to diagnose MRSA infection nor to guide or monitor treatment for MRSA infections.     Coagulation Studies: No results for input(s): LABPROT, INR in the last 72 hours.  Urinalysis:   Recent Labs Lab 05/28/15 0325  COLORURINE AMBER*  LABSPEC 1.031*  PHURINE 5.0  GLUCOSEU NEGATIVE  HGBUR 3+*  BILIRUBINUR NEGATIVE  KETONESUR 1+*  PROTEINUR >500*  NITRITE NEGATIVE  LEUKOCYTESUR TRACE*    Lipid Panel:  No results found for: CHOL, TRIG, HDL, CHOLHDL, VLDL, LDLCALC  HgbA1C: No results found for: HGBA1C  Urine Drug Screen:  No results found for: LABOPIA, COCAINSCRNUR, LABBENZ, AMPHETMU, THCU, LABBARB  Alcohol Level: No results for input(s): ETH in the last 168 hours.  Other results: EKG: normal EKG, normal sinus rhythm, unchanged from previous tracings.  Imaging: Mr Cervical Spine Wo Contrast  06/02/2015   CLINICAL DATA:  Chronic BILATERAL hand weakness. This is worse on the RIGHT.  EXAM: MRI CERVICAL SPINE WITHOUT CONTRAST  TECHNIQUE: Multiplanar, multisequence MR imaging of the cervical spine was performed. No intravenous contrast was administered.  COMPARISON:  None.  FINDINGS: Mild motion degradation.  Overall the study is diagnostic.  Alignment: Anatomic.  Vertebrae: No worrisome osseous lesion.  Cord: Moderate cord flattening at C4-5, with  without abnormal cord signal.  Posterior Fossa: No tonsillar herniation.  Vertebral Arteries: BILATERAL patent.  RIGHT slightly larger.  Paraspinal tissues: No neck masses.  Disc levels:  The individual disc spaces were examined as follows:  C2-3:  Normal.  C3-4:  Normal.  C4-5: Central disc extrusion. Moderate cord flattening with canal diameter 6-7 mm. LEFT-sided uncinate spurring along with disc material, slightly greater on the LEFT compress the LEFT C5 nerve root.  C5-6:  Normal.  C6-7: Shallow subligamentous protrusion centrally. Slight effacement anterior subarachnoid space. No significant cord compression or spinal stenosis.  C7-T1:  Normal.  IMPRESSION: Central disc extrusion at C4-5. Moderate cord flattening without abnormal cord signal. LEFT-sided foraminal narrowing contributes to LEFT C5 nerve root compression.  Shallow subligamentous protrusion centrally  at C6-7. No neural impingement is seen.   Electronically Signed   By: Elsie Stain M.D.   On: 06/02/2015 21:51     Assessment/Plan: 39 y.o. female  with a known history of stroke, chronic right-sided weakness is present into the ED with a chief complaint of generalized weakness that started a week prior and has been progressing. No prior history of similar symptoms. Concern for GBS. Pt is s/p LP with clear cytoalbuminological dissociation (elevated protein with normal cell count). MRI C and T spine no signs of transverse myelitis and inflamed roots.  Pt states she was able to ambulate with limited assistance along with PT  Strength much improved in b/l LE, Pt has clear Bell's Palsy on the R side which was minor yesterday but now evident.   She is also complaining of L hand numbness likely due to MRI C spine, but no acute changes with chronic disk dz  Finish IVIG today, and start medrol pack tomorrow with d/c likely tomorrow or day after Alexandria Murray, Alexandria Murray  06/03/2015, 11:44 AM

## 2015-06-03 NOTE — Progress Notes (Signed)
[facility] [place] at [facility]   PATIENT NAME: Alexandria Murray    MR#:  [id number]  DATE OF BIRTH:  01-09-1976  SUBJECTIVE:seen at bedside.admitted GB syndrome.on IV IG ,pt mstill feels heavy in legs,righ facial numbness.her NIF is -35 cm.  CHIEF COMPLAINT:   Chief Complaint  Patient presents with  . Weakness   lower extremity weakness is getting better. Right facial weakness is seemed to be also somewhat better today than it was yesterday . Marland Kitchen Left hand and numbness seemed to be less pronounced. Admits of lower extremity numbness sensation. States that she cannot feel well floor when she walks. C-spine MRI revealed central disc extrusion at C4-5 with moderate cord flattening and left-sided foraminal narrowing contributing to left C5 nerve root compression.   REVIEW OF SYSTEMS:    Review of Systems  Constitutional: Negative for fever and chills.  HENT: Negative for hearing loss.   Eyes: Negative for blurred vision, double vision and photophobia.  Respiratory: Negative for cough, hemoptysis and shortness of breath.   Cardiovascular: Negative for palpitations, orthopnea and leg swelling.  Gastrointestinal: Negative for vomiting, abdominal pain and diarrhea.  Genitourinary: Negative for dysuria, urgency, frequency and hematuria.  Musculoskeletal: Negative for myalgias and neck pain.  Skin: Negative for rash.  Neurological: Positive for sensory change and focal weakness. Negative for dizziness, seizures, weakness and headaches.        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 The patient does not have insomnia.     Nutrition:  Dysphagia 1 diet with thin liquids      DRUG ALLERGIES:  No Known Allergies  VITALS:  Blood pressure 147/94, pulse 93, temperature 97.8 F (36.6 C), temperature source Oral, resp. rate 18, height  (1.549 m), weight 65.318 kg (144 lb),  last menstrual period 05/26/2015, SpO2 100 %.  PHYSICAL EXAMINATION:   Physical Exam  Constitutional: She is oriented to person, place, and time.  Neurological: She is alert and oriented to person, place, and time. No cranial nerve deficit.  Decreased power in both legs    GENERAL:  39 y.o.-year-old patient lying in the bed with no acute distress.  EYES: Pupils equal, round, reactive to light and accommodation. No scleral icterus. Extraocular muscles intact.  HEENT: Head atraumatic, normocephalic. Oropharynx and nasopharynx clear.  NECK:  Supple, no jugular venous distention. No thyroid enlargement, no tenderness.  LUNGS: Normal breath sounds bilaterally, no wheezing, rales,rhonchi or crepitation. No use of accessory muscles of respiration.  CARDIOVASCULAR: S1, S2 normal. No murmurs, rubs, or gallops.  ABDOMEN: Soft, nontender, nondistended. Bowel sounds present. No organomegaly or mass.  EXTREMITIES: No pedal edema, cyanosis, or clubbing.  NEUROLOGIC: . Muscle strength 4 out of 5 in lower extremities bilaterally and 4/5 in upper extremities. Decreased light touch sensation in the upper and lower extremities. Gait not checked. Right facial weakness including upper face PSYCHIATRIC: The patient is alert and oriented x 3.  SKIN: No obvious rash, lesion, or ulcer.    LABORATORY PANEL:   CBC  Recent Labs Lab 06/02/15 1101  WBC 5.0  HGB 15.0  HCT 42.4  PLT 146*   ------------------------------------------------------------------------------------------------------------------  Chemistries   Recent Labs Lab 05/29/15 0612  06/01/15 0343  NA 135  < > 136  K 3.3*  < > 3.6  CL 104  < > 107  CO2 23  < > 23  GLUCOSE 103*  < > 112*  BUN 13  < >  27*  CREATININE 0.44  < > 0.52  CALCIUM 8.8*  < > 8.7*  AST 15  --   --   ALT 13*  --   --   ALKPHOS 67  --   --   BILITOT 0.9  --   --   < > = values in this interval not  displayed. ------------------------------------------------------------------------------------------------------------------  Cardiac Enzymes No results for input(s): TROPONINI in the last 168 hours. ------------------------------------------------------------------------------------------------------------------  RADIOLOGY:  Mr Cervical Spine Wo Contrast  06/02/2015   CLINICAL DATA:  Chronic BILATERAL hand weakness. This is worse on the RIGHT.  EXAM: MRI CERVICAL SPINE WITHOUT CONTRAST  TECHNIQUE: Multiplanar, multisequence MR imaging of the cervical spine was performed. No intravenous contrast was administered.  COMPARISON:  None.  FINDINGS: Mild motion degradation.  Overall the study is diagnostic.  Alignment: Anatomic.  Vertebrae: No worrisome osseous lesion.  Cord: Moderate cord flattening at C4-5, with without abnormal cord signal.  Posterior Fossa: No tonsillar herniation.  Vertebral Arteries: BILATERAL patent.  RIGHT slightly larger.  Paraspinal tissues: No neck masses.  Disc levels:  The individual disc spaces were examined as follows:  C2-3:  Normal.  C3-4:  Normal.  C4-5: Central disc extrusion. Moderate cord flattening with canal diameter 6-7 mm. LEFT-sided uncinate spurring along with disc material, slightly greater on the LEFT compress the LEFT C5 nerve root.  C5-6:  Normal.  C6-7: Shallow subligamentous protrusion centrally. Slight effacement anterior subarachnoid space. No significant cord compression or spinal stenosis.  C7-T1:  Normal.  IMPRESSION: Central disc extrusion at C4-5. Moderate cord flattening without abnormal cord signal. LEFT-sided foraminal narrowing contributes to LEFT C5 nerve root compression.  Shallow subligamentous protrusion centrally at C6-7. No neural impingement is seen.   Electronically Signed   By: Elsie Stain M.D.   On: 06/02/2015 21:51     ASSESSMENT AND PLAN:   Active Problems:   Guillain Barr syndrome   Smoker  1. GB syndrome;    Continue IV IG  for a total of 7 days, last day today. 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 Bell's palsy, valacyclovir is not effective, per neurology, initiate Medrol Dosepak upon discharge 3. . Pyuria, likely contamination   IV rocpehin discontinued  cultures-probably contaminated specimen and it has multiple species  3. Copd no wheezing Nebulizer treatments as needed basis  ,h/o tobacco abuse-counseled patient to quit smoking  4.htn;controlled Monitor closely for possible respiratory depression related to GB sndrome  5 . Bilateral. Shoulder pains, questionable rotator cuff injury per emergency room notes. We are awaiting for orthopedic surgeon's input and cervical spine MRI results are observed and it could be that cervical spine abnormalities is the cause for patient's discomfort in shoulders.      All the records are reviewed and case discussed with Care Management/Social Workerr. Management plans discussed with the patient, family and neurologythey are in agreement.  CODE STATUS:full  TOTAL TIME TAKING CARE OF THIS PATIENT: Reviewing medical records, follow-up visit , new orders and coordination of care 35 min.  POSSIBLE D/C IN1-2 DAYS, DEPENDING ON CLINICAL CONDITION.   Katharina Caper M.D on 06/03/2015 at  10:28 AM  Between 7am to 6pm - Pager - [phone]  After 6pm go to [email or web address] - password [email or web address]  [facility]  Office  [phone]  CC: Primary care physician; No PCP Per Patient

## 2015-06-03 NOTE — Plan of Care (Signed)
Problem: Discharge Progression Outcomes Goal: Other Discharge Outcomes/Goals Outcome: Progressing Plan of care progress to goal for: 1. Pain-pt c/o pain, prn meds given with improvement 2. Hemodynamically-             -VSS, pt remain afebrile this shift 3. Complications-no evidence of this shift 4. Diet-pt tolerating diet this shift 5. Activity-pt walking to BR with assistance, walked with physical therapy around nursing station, and went outside with family.

## 2015-06-04 ENCOUNTER — Inpatient Hospital Stay: Payer: Managed Care, Other (non HMO)

## 2015-06-04 LAB — CREATININE, SERUM
CREATININE: 0.52 mg/dL (ref 0.44–1.00)
GFR calc Af Amer: >60 mL/min (ref >60)
GFR calc non Af Amer: >60 mL/min (ref >60)

## 2015-06-04 MED ORDER — METHYLPREDNISOLONE 4 MG PO TBPK
8.0000 mg | ORAL_TABLET | Freq: Every morning | ORAL | Status: AC
  Administered 2015-06-04: 15:00:00 8 mg via ORAL
  Filled 2015-06-04: qty 21

## 2015-06-04 MED ORDER — OXYCODONE HCL 5 MG PO TABS: 10.0000 mg | ORAL_TABLET | Freq: Four times a day (QID) | ORAL | Status: DC | PRN

## 2015-06-04 MED ORDER — METHYLPREDNISOLONE 4 MG PO TBPK
4.0000 mg | ORAL_TABLET | ORAL | Status: AC
  Administered 2015-06-04: 4 mg via ORAL

## 2015-06-04 MED ORDER — METHYLPREDNISOLONE 4 MG PO TBPK
8.0000 mg | ORAL_TABLET | Freq: Every evening | ORAL | Status: AC
  Administered 2015-06-04: 22:00:00 8 mg via ORAL

## 2015-06-04 MED ORDER — METHYLPREDNISOLONE 4 MG PO TBPK: 8.0000 mg | ORAL_TABLET | Freq: Every evening | ORAL | Status: DC

## 2015-06-04 MED ORDER — NICOTINE 10 MG IN INHA
1.0000 | RESPIRATORY_TRACT | Status: DC | PRN
  Filled 2015-06-04: qty 36

## 2015-06-04 MED ORDER — METHYLPREDNISOLONE 4 MG PO TBPK: 4.0000 mg | ORAL_TABLET | Freq: Four times a day (QID) | ORAL | Status: DC

## 2015-06-04 MED ORDER — METHYLPREDNISOLONE 4 MG PO TBPK
4.0000 mg | ORAL_TABLET | Freq: Three times a day (TID) | ORAL | Status: DC
  Administered 2015-06-05: 4 mg via ORAL

## 2015-06-04 NOTE — Care Management (Signed)
Spoke with Alexandria Murray the bedside. Discussed that she would need a primary care physician that could sign her homehealth/physical therapy orders when discharged. Gave list of physicians accepting new patients. States she would starting calling the doctor's office.  Possible discharge tomorrow per Dr. Winona Legato. Will need a rolling walker. Gwenette Greet RN MSN Care Management [phone]

## 2015-06-04 NOTE — Consult Note (Signed)
Subjective:  Stop by to see the patient today and explained the findings on her MRI. He has supraspinatus tendinitis but no rotator cuff tear. Patient reports pain as mild.  She still has pain with forward elevation and abduction.  Objective:   VITALS:   Filed Vitals:   06/04/15 0510 06/04/15 0620 06/04/15 0636 06/04/15 1305  BP: 168/108 135/90 135/85 133/88  Pulse:   91 95  Temp:    98.1 F (36.7 C)  TempSrc:    Oral  Resp:    19  Height:      Weight:      SpO2:    97%   Left shoulder: Patient is neurovascularly intact. She continues to have pain with forward elevation and abduction of 90.   LABS  Results for orders placed or performed during the hospital encounter of 05/28/15 (from the past 24 hour(s))  Creatinine, serum     Status: None   Collection Time: 06/04/15  4:37 AM  Result Value Ref Range   Creatinine, Ser 0.52 0.44 - 1.00 mg/dL   GFR calc non Af Amer >60 >60 mL/min   GFR calc Af Amer >60 >60 mL/min    Mr Cervical Spine Wo Contrast  06/02/2015   CLINICAL DATA:  Chronic BILATERAL hand weakness. This is worse on the RIGHT.  EXAM: MRI CERVICAL SPINE WITHOUT CONTRAST  TECHNIQUE: Multiplanar, multisequence MR imaging of the cervical spine was performed. No intravenous contrast was administered.  COMPARISON:  None.  FINDINGS: Mild motion degradation.  Overall the study is diagnostic.  Alignment: Anatomic.  Vertebrae: No worrisome osseous lesion.  Cord: Moderate cord flattening at C4-5, with without abnormal cord signal.  Posterior Fossa: No tonsillar herniation.  Vertebral Arteries: BILATERAL patent.  RIGHT slightly larger.  Paraspinal tissues: No neck masses.  Disc levels:  The individual disc spaces were examined as follows:  C2-3:  Normal.  C3-4:  Normal.  C4-5: Central disc extrusion. Moderate cord flattening with canal diameter 6-7 mm. LEFT-sided uncinate spurring along with disc material, slightly greater on the LEFT compress the LEFT C5 nerve root.  C5-6:  Normal.   C6-7: Shallow subligamentous protrusion centrally. Slight effacement anterior subarachnoid space. No significant cord compression or spinal stenosis.  C7-T1:  Normal.  IMPRESSION: Central disc extrusion at C4-5. Moderate cord flattening without abnormal cord signal. LEFT-sided foraminal narrowing contributes to LEFT C5 nerve root compression.  Shallow subligamentous protrusion centrally at C6-7. No neural impingement is seen.   Electronically Signed   By: Elsie Stain M.D.   On: 06/02/2015 21:51   Mr Shoulder Left Wo Contrast  06/04/2015   CLINICAL DATA:  Acute LEFT shoulder pain. Chronic right-sided weakness. Patient presented with generalized weakness.  EXAM: MRI OF THE LEFT SHOULDER WITHOUT CONTRAST  TECHNIQUE: Multiplanar, multisequence MR imaging of the shoulder was performed. No intravenous contrast was administered.  COMPARISON:  None.  FINDINGS: Study is moderately degraded by patient motion. Technical parameters required modification to obtain a diagnostic study.  Rotator cuff:  Intact with tendinopathy of SUPRASPINATUS.  Muscles:  No atrophy.  No edema.  Biceps long head:  Intact.  Acromioclavicular Joint: Type 2 acromion. No significant AC joint osteoarthritis. Negative for bursitis.  Glenohumeral Joint: Normal.  Labrum:  Normal.  Bones:  No significant extra-articular findings.  IMPRESSION: Mild SUPRASPINATUS tendinopathy. No rotator cuff tear or bursitis. Study moderately degraded by motion artifact.   Electronically Signed   By: Andreas Newport M.D.   On: 06/04/2015 10:24    Assessment/Plan:  Active Problems:   Guillain Barr syndrome   Smoker  Patient has been started on a Medrol taper. This should help her left shoulder supraspinatus tendinitis as well. Patient would benefit from physical therapy on the left shoulder. She may start as an inpatient if she is not being discharged. If she has been discharged she would benefit from a prescription for outpatient therapy for left shoulder  rotator cuff tendinitis. She may follow up my office in 2 weeks for reevaluation if she continues to have pain in the left shoulder.    Juanell Fairly , MD 06/04/2015, 4:22 PM

## 2015-06-04 NOTE — Progress Notes (Signed)
[facility] - [place] at [facility]   PATIENT NAME: Alexandria Murray    MR#:  [id number]  DATE OF BIRTH:  1976/03/18  SUBJECTIVE:seen at bedside.admitted GB syndrome.on IV IG ,pt mstill feels heavy in legs,righ facial numbness.her NIF is -35 cm.  CHIEF COMPLAINT:   Chief Complaint  Patient presents with  . Weakness   lower extremity weakness is getting better, except of right lower extremity, which seemed to be weaker than the left lower extremity. Right facial weakness is much improved . Still bilateral shoulder pain as well as mild Left hand numbness . Admited of lower extremity numbness sensation. Stated that she cannot feel well floor when she walks. C-spine MRI revealed central disc extrusion at C4-5 with moderate cord flattening and left-sided foraminal narrowing contributing to left C5 nerve root compression.  Feels good today. Wants to go home orthopedist surgeons are working to figure out about you patient's shoulder issues. MR arthrogram was performed of her left shoulder which revealed supraspinatus muscle tendinopathy.  REVIEW OF SYSTEMS:    Review of Systems  Constitutional: Negative for fever and chills.  HENT: Negative for hearing loss.   Eyes: Negative for blurred vision, double vision and photophobia.  Respiratory: Negative for cough, hemoptysis and shortness of breath.   Cardiovascular: Negative for palpitations, orthopnea and leg swelling.  Gastrointestinal: Negative for vomiting, abdominal pain and diarrhea.  Genitourinary: Negative for dysuria, urgency, frequency and hematuria.  Musculoskeletal: Negative for myalgias and neck pain.  Skin: Negative for rash.  Neurological: Positive for sensory change and focal weakness. Negative for dizziness, seizures, weakness and headaches.        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 The patient does not have insomnia.      Nutrition:  Dysphagia 1 diet with thin liquids      DRUG ALLERGIES:  No Known Allergies  VITALS:  Blood pressure 133/88, pulse 95, temperature 98.1 F (36.7 C), temperature source Oral, resp. rate 19, height  (1.549 m), weight 65.318 kg (144 lb), last menstrual period 05/26/2015, SpO2 97 %.  PHYSICAL EXAMINATION:   Physical Exam  Constitutional: She is oriented to person, place, and time.  Neurological: She is alert and oriented to person, place, and time. No cranial nerve deficit.  Decreased power in both legs    GENERAL:  39 y.o.-year-old patient lying in the bed with no acute distress.  EYES: Pupils equal, round, reactive to light and accommodation. No scleral icterus. Extraocular muscles intact.  HEENT: Head atraumatic, normocephalic. Oropharynx and nasopharynx clear.  NECK:  Supple, no jugular venous distention. No thyroid enlargement, no tenderness.  LUNGS: Normal breath sounds bilaterally, no wheezing, rales,rhonchi or crepitation. No use of accessory muscles of respiration.  CARDIOVASCULAR: S1, S2 normal. No murmurs, rubs, or gallops.  ABDOMEN: Soft, nontender, nondistended. Bowel sounds present. No organomegaly or mass.  EXTREMITIES: No pedal edema, cyanosis, or clubbing.  NEUROLOGIC: . Muscle strength 3+ out of 5 in right lower extremity, 4/5 left lower extremity and 4/5 in upper extremities. Decreased light touch sensation in the upper and lower extremities. Gait not checked. Right facial weakness is resolving this patient is able to smile and almost symmetric  PSYCHIATRIC: The patient is alert and oriented x 3.  SKIN: No obvious rash, lesion, or ulcer.    LABORATORY PANEL:   CBC  Recent Labs Lab 06/02/15 1101  WBC 5.0  HGB 15.0  HCT 42.4  PLT 146*   ------------------------------------------------------------------------------------------------------------------  Chemistries   Recent Labs Lab 05/29/15 0612  06/01/15 0343 06/04/15 0437  NA  135  < > 136  --   K 3.3*  < > 3.6  --   CL 104  < > 107  --   CO2 23  < > 23  --   GLUCOSE 103*  < > 112*  --   BUN 13  < > 27*  --   CREATININE 0.44  < > 0.52 0.52  CALCIUM 8.8*  < > 8.7*  --   AST 15  --   --   --   ALT 13*  --   --   --   ALKPHOS 67  --   --   --   BILITOT 0.9  --   --   --   < > = values in this interval not displayed. ------------------------------------------------------------------------------------------------------------------  Cardiac Enzymes No results for input(s): TROPONINI in the last 168 hours. ------------------------------------------------------------------------------------------------------------------  RADIOLOGY:  Mr Cervical Spine Wo Contrast  06/02/2015   CLINICAL DATA:  Chronic BILATERAL hand weakness. This is worse on the RIGHT.  EXAM: MRI CERVICAL SPINE WITHOUT CONTRAST  TECHNIQUE: Multiplanar, multisequence MR imaging of the cervical spine was performed. No intravenous contrast was administered.  COMPARISON:  None.  FINDINGS: Mild motion degradation.  Overall the study is diagnostic.  Alignment: Anatomic.  Vertebrae: No worrisome osseous lesion.  Cord: Moderate cord flattening at C4-5, with without abnormal cord signal.  Posterior Fossa: No tonsillar herniation.  Vertebral Arteries: BILATERAL patent.  RIGHT slightly larger.  Paraspinal tissues: No neck masses.  Disc levels:  The individual disc spaces were examined as follows:  C2-3:  Normal.  C3-4:  Normal.  C4-5: Central disc extrusion. Moderate cord flattening with canal diameter 6-7 mm. LEFT-sided uncinate spurring along with disc material, slightly greater on the LEFT compress the LEFT C5 nerve root.  C5-6:  Normal.  C6-7: Shallow subligamentous protrusion centrally. Slight effacement anterior subarachnoid space. No significant cord compression or spinal stenosis.  C7-T1:  Normal.  IMPRESSION: Central disc extrusion at C4-5. Moderate cord flattening without abnormal cord signal. LEFT-sided  foraminal narrowing contributes to LEFT C5 nerve root compression.  Shallow subligamentous protrusion centrally at C6-7. No neural impingement is seen.   Electronically Signed   By: Elsie Stain M.D.   On: 06/02/2015 21:51   Mr Shoulder Left Wo Contrast  06/04/2015   CLINICAL DATA:  Acute LEFT shoulder pain. Chronic right-sided weakness. Patient presented with generalized weakness.  EXAM: MRI OF THE LEFT SHOULDER WITHOUT CONTRAST  TECHNIQUE: Multiplanar, multisequence MR imaging of the shoulder was performed. No intravenous contrast was administered.  COMPARISON:  None.  FINDINGS: Study is moderately degraded by patient motion. Technical parameters required modification to obtain a diagnostic study.  Rotator cuff:  Intact with tendinopathy of SUPRASPINATUS.  Muscles:  No atrophy.  No edema.  Biceps long head:  Intact.  Acromioclavicular Joint: Type 2 acromion. No significant AC joint osteoarthritis. Negative for bursitis.  Glenohumeral Joint: Normal.  Labrum:  Normal.  Bones:  No significant extra-articular findings.  IMPRESSION: Mild SUPRASPINATUS tendinopathy. No rotator cuff tear or bursitis. Study moderately degraded by motion artifact.   Electronically Signed   By: Andreas Newport M.D.   On: 06/04/2015 10:24     ASSESSMENT AND PLAN:   Active Problems:   Guillain Barr syndrome   Smoker  1. GB syndrome;    Completed IV IG for a total of 7 days. Probably to discharge home health PT  and RN tomorrow after orthopedic workup is completed. Continue physical therapy and occupational therapy, also speech therapy  2. Dysphagia due to Bell's palsy . 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 Bell's palsy, valacyclovir is not effective, per neurology, initiate  Medrol Dosepak today 4. . Pyuria, likely contamination   IV rocpehin discontinued  cultures-probably contaminated specimen and it has multiple species  5. Copd no wheezing Nebulizer treatments as needed basis  ,6h/o tobacco abuse-counseled patient to quit smoking, discussed this patient for approximately 5 minutes a nicotine replacement therapy with nicotine inhaler will be initiated  7.htn;controlled Monitor closely for possible respiratory depression related to GB sndrome  5 . Bilateral. Shoulder pains, suspected rotator cuff injury. Dr. Martha Clan ordered MR arthrogram of the left shoulder in the revealed mild supraspinatus muscle tendinitis. Therapy will be determined by Dr. Martha Clan.     All the records are reviewed and case discussed with Care Management/Social Workerr. Management plans discussed with the patient, family and neurologythey are in agreement.  CODE STATUS:full  TOTAL TIME TAKING CARE OF THIS PATIENT: Reviewing medical records, follow-up visit , new orders and coordination of care 35 min.  POSSIBLE D/C IN1-2 DAYS, DEPENDING ON CLINICAL CONDITION.   Katharina Caper M.D on 06/04/2015 at 1:40 PM  Between 7am to 6pm - Pager - [phone]  After 6pm go to [email or web address] - password [email or web address] [facility]  [facility]  Office  [phone]  CC: Primary care physician; No PCP Per Patient

## 2015-06-04 NOTE — Progress Notes (Signed)
PT Cancellation Note  Patient Details Name: Alexandria Murray MRN: [id number] DOB: 03/21/1976   Cancelled Treatment:    Reason Eval/Treat Not Completed: Other (comment). Pt currently at MRI imaging. Not available for therapy at this time. Will re-attempt.   Quincey Nored 06/04/2015, 10:20 AM   Elizabeth Palau, PT, DPT [phone]

## 2015-06-04 NOTE — Progress Notes (Signed)
Physical Therapy Treatment Patient Details Name: Alexandria Murray MRN: [id number] DOB: 1976-01-22 Today's Date: 06/04/2015    History of Present Illness Pt is a 39 year-old female admitted to the hospital for suspected Guillaine-Barre Syndrome     PT Comments    Pt doing excellent with therapy this date. She is ambulating further and has increased her independence with bed mobility and transfers. Although her functional strength is improving she still has significant weakness leading to buckling in the knees during ambulation. Sensation impairments are also still noted in LEs with ambulation. Pt will continue to benefit from skilled PT in order to address these deficits in order for her to return home safely.   Follow Up Recommendations  Home health PT     Equipment Recommendations  Rolling walker with 5" wheels    Recommendations for Other Services       Precautions / Restrictions Precautions Precautions: Fall Restrictions Weight Bearing Restrictions: No    Mobility  Bed Mobility Overal bed mobility: Modified Independent Bed Mobility: Supine to Sit;Sit to Supine     Supine to sit: Modified independent (Device/Increase time) Sit to supine: Modified independent (Device/Increase time)   General bed mobility comments: Pt uses rails and is able to perform general bed mobility with mod I, demonstrating LE strength to do so  Transfers Overall transfer level: Needs assistance Equipment used: Rolling walker (2 wheeled) Transfers: Sit to/from Stand Sit to Stand: Min guard         General transfer comment: Pt shows functional strength to get into full standing. Still needs CGA for occasional buckling.   Ambulation/Gait Ambulation/Gait assistance: Min guard;+2 safety/equipment Ambulation Distance (Feet): 400 Feet Assistive device: Rolling walker (2 wheeled) Gait Pattern/deviations: Step-through pattern;Decreased step length - right Gait velocity: decreased    General Gait  Details: Pt needs cues for increasing step length on R and also to walk within her walker and take her time ambulating. Pt did not require breaks in ambulation due to fatigue.    Stairs Stairs: Yes Stairs assistance: Min guard Stair Management: Two rails Number of Stairs: 4 General stair comments: Pt requires cues for hand placement on rails and to take steps one at a time. Pt guarded closely secondary to hx of bilateral knee buckling   Wheelchair Mobility    Modified Rankin (Stroke Patients Only)       Balance Overall balance assessment: History of Falls                                  Cognition Arousal/Alertness: Awake/alert Behavior During Therapy: WFL for tasks assessed/performed Overall Cognitive Status: Within Functional Limits for tasks assessed                      Exercises Other Exercises Other Exercises: RLE noted to be weaker this date and required min assist for all exercises. LLE needed supervision for proper technique. Bilateral LE exercise was performed included: supine SLR and hip abd x15 reps; sitting exercises included pillow squeezes, LAQ, and hip marching x 15 reps. Standing exercises included hamstring curl, knee raise, and hip ext x 10 reps. Pt with minor knee buckling during standing exercises.     General Comments        Pertinent Vitals/Pain Pain Assessment: No/denies pain    Home Living  Prior Function            PT Goals (current goals can now be found in the care plan section) Acute Rehab PT Goals Patient Stated Goal: to do stairs PT Goal Formulation: With patient Time For Goal Achievement: 06/13/15 Potential to Achieve Goals: Good Progress towards PT goals: Progressing toward goals    Frequency  7X/week    PT Plan Current plan remains appropriate    Co-evaluation             End of Session Equipment Utilized During Treatment: Gait belt Activity Tolerance: Patient  tolerated treatment well Patient left: in bed;with chair alarm set;with call bell/phone within reach     Time: 4098-1191 PT Time Calculation (min) (ACUTE ONLY): 42 min  Charges:                       G CodesBenna Dunks June 16, 2015, 1:21 PM  Benna Dunks, SPT. [phone]

## 2015-06-04 NOTE — Plan of Care (Signed)
Problem: Discharge Progression Outcomes Goal: Other Discharge Outcomes/Goals Outcome: Progressing Plan of care progress to goal for: Pt worked with PT, did very well. C/o pain, tramadol and toradol given PRN for pain.  Patient stated relief. Receiving IVIG infusions. Pt put on steroid pack today. Plan for discharge to home.

## 2015-06-04 NOTE — Consult Note (Signed)
CC: weakness in lower extremities   HPI: Alexandria Murray is an 39 y.o. female  with a known history of stroke, chronic right-sided weakness is present into the ED with a chief complaint of generalized weakness that started a week prior and has been progressing. No prior history of similar symptoms. Concern for GBS. Pt is s/p LP with clear cytoalbuminological dissociation (elevated protein with normal cell count). MRI C and T spine no signs of transverse myelitis and inflamed roots.   MRI C spine significant for multi level disk changes with disk protrusion.      Passed swallow eval, worsening bulbar symptoms but likely from R sided Bell's Palsy.   Symptoms improved this AM and able to ambulate. Speech clear.  Past Medical History  Diagnosis Date  . Stroke     Past Surgical History  Procedure Laterality Date  . Cholecystectomy    . Finger surgery      No family history on file.  Social History:  reports that she has been smoking Cigarettes.  She has been smoking about 2.00 packs per day. She does not have any smokeless tobacco history on file. She reports that she does not drink alcohol or use illicit drugs.  No Known Allergies  Medications: I have reviewed the patient's current medications.  Physical Examination: Blood pressure 133/88, pulse 95, temperature 98.1 F (36.7 C), temperature source Oral, resp. rate 19, height  (1.549 m), weight 65.318 kg (144 lb), last menstrual period 05/26/2015, SpO2 97 %.    Neurological Examination Mental Status: Alert, oriented, thought content appropriate.  Speech fluent without evidence of aphasia.  Able to follow 3 step commands without difficulty. Cranial Nerves: II: Discs flat bilaterally; Visual fields grossly normal, pupils equal, round, reactive to light and accommodation III,IV, VI: R eye difficulty closing V,VII: small R facial droop.  VIII: hearing normal bilaterally IX,X: gag reflex present XI: bilateral shoulder  shrug XII: midline tongue extension Motor: Right : Upper extremity   4+/5    Left:     Upper extremity  4+/5  Lower extremity   4+/5     Lower extremity   4+/5 Tone and bulk:normal tone throughout; no atrophy noted Sensory: Pinprick and light touch intact throughout, bilaterally Deep Tendon Reflexes: absent reflexes in there lower extremities Plantars: Right: downgoing   Left: downgoing Cerebellar: normal finger-to-nose, normal rapid alternating movements and normal heel-to-shin test Gait: not tested.        Laboratory Studies:   Basic Metabolic Panel:  Recent Labs Lab 05/28/15 2211 05/29/15 0612 05/31/15 0521 06/01/15 0343 06/04/15 0437  NA  --  135 134* 136  --   K  --  3.3* 3.6 3.6  --   CL  --  104 103 107  --   CO2  --  --   GLUCOSE  --  103* 101* 112*  --   BUN  --  13 23* 27*  --   CREATININE 0.56 0.44 0.57 0.52 0.52  CALCIUM  --  8.8* 9.0 8.7*  --     Liver Function Tests:  Recent Labs Lab 05/29/15 0612  AST 15  ALT 13*  ALKPHOS 67  BILITOT 0.9  PROT 7.6  ALBUMIN 3.7   No results for input(s): LIPASE, AMYLASE in the last 168 hours. No results for input(s): AMMONIA in the last 168 hours.  CBC:  Recent Labs Lab 05/28/15 2211 05/29/15 0612 05/31/15 0521 06/02/15 1101  WBC 12.7* 8.9 4.5  5.0  HGB 17.1* 17.2* 16.6* 15.0  HCT 50.9* 49.9* 49.4* 42.4  MCV 91.9 91.4 92.2 89.5  PLT 224 199 179 146*    Cardiac Enzymes:  Recent Labs Lab 05/28/15 2211  CKTOTAL 53    BNP: Invalid input(s): POCBNP  CBG:  Recent Labs Lab 05/28/15 2101 05/30/15 2123  GLUCAP 107* 113*    Microbiology: Results for orders placed or performed during the hospital encounter of 05/28/15  Urine culture     Status: None   Collection Time: 05/28/15  3:25 AM  Result Value Ref Range Status   Specimen Description URINE, CLEAN CATCH  Final   Special Requests NONE  Final   Culture MULTIPLE SPECIES PRESENT, SUGGEST RECOLLECTION  Final   Report Status  05/30/2015 FINAL  Final  CSF culture     Status: None   Collection Time: 05/28/15  3:48 PM  Result Value Ref Range Status   Specimen Description CSF  Final   Special Requests Normal  Final   Gram Stain NO BACTERIA SEEN RARE WBC CONFIRMED BY TFK   Final   Culture NO GROWTH 3 DAYS  Final   Report Status 05/31/2015 FINAL  Final  MRSA PCR Screening     Status: None   Collection Time: 05/28/15  9:30 PM  Result Value Ref Range Status   MRSA by PCR NEGATIVE NEGATIVE Final    Comment:        The GeneXpert MRSA Assay (FDA approved for NASAL specimens only), is one component of a comprehensive MRSA colonization surveillance program. It is not intended to diagnose MRSA infection nor to guide or monitor treatment for MRSA infections.     Coagulation Studies: No results for input(s): LABPROT, INR in the last 72 hours.  Urinalysis:  No results for input(s): COLORURINE, LABSPEC, PHURINE, GLUCOSEU, HGBUR, BILIRUBINUR, KETONESUR, PROTEINUR, UROBILINOGEN, NITRITE, LEUKOCYTESUR in the last 168 hours.  Invalid input(s): APPERANCEUR  Lipid Panel:  No results found for: CHOL, TRIG, HDL, CHOLHDL, VLDL, LDLCALC  HgbA1C: No results found for: HGBA1C  Urine Drug Screen:  No results found for: LABOPIA, COCAINSCRNUR, LABBENZ, AMPHETMU, THCU, LABBARB  Alcohol Level: No results for input(s): ETH in the last 168 hours.  Other results: EKG: normal EKG, normal sinus rhythm, unchanged from previous tracings.  Imaging: Mr Cervical Spine Wo Contrast  06/02/2015   CLINICAL DATA:  Chronic BILATERAL hand weakness. This is worse on the RIGHT.  EXAM: MRI CERVICAL SPINE WITHOUT CONTRAST  TECHNIQUE: Multiplanar, multisequence MR imaging of the cervical spine was performed. No intravenous contrast was administered.  COMPARISON:  None.  FINDINGS: Mild motion degradation.  Overall the study is diagnostic.  Alignment: Anatomic.  Vertebrae: No worrisome osseous lesion.  Cord: Moderate cord flattening at C4-5, with  without abnormal cord signal.  Posterior Fossa: No tonsillar herniation.  Vertebral Arteries: BILATERAL patent.  RIGHT slightly larger.  Paraspinal tissues: No neck masses.  Disc levels:  The individual disc spaces were examined as follows:  C2-3:  Normal.  C3-4:  Normal.  C4-5: Central disc extrusion. Moderate cord flattening with canal diameter 6-7 mm. LEFT-sided uncinate spurring along with disc material, slightly greater on the LEFT compress the LEFT C5 nerve root.  C5-6:  Normal.  C6-7: Shallow subligamentous protrusion centrally. Slight effacement anterior subarachnoid space. No significant cord compression or spinal stenosis.  C7-T1:  Normal.  IMPRESSION: Central disc extrusion at C4-5. Moderate cord flattening without abnormal cord signal. LEFT-sided foraminal narrowing contributes to LEFT C5 nerve root compression.  Shallow subligamentous protrusion  centrally at C6-7. No neural impingement is seen.   Electronically Signed   By: Elsie Stain M.D.   On: 06/02/2015 21:51   Mr Shoulder Left Wo Contrast  06/04/2015   CLINICAL DATA:  Acute LEFT shoulder pain. Chronic right-sided weakness. Patient presented with generalized weakness.  EXAM: MRI OF THE LEFT SHOULDER WITHOUT CONTRAST  TECHNIQUE: Multiplanar, multisequence MR imaging of the shoulder was performed. No intravenous contrast was administered.  COMPARISON:  None.  FINDINGS: Study is moderately degraded by patient motion. Technical parameters required modification to obtain a diagnostic study.  Rotator cuff:  Intact with tendinopathy of SUPRASPINATUS.  Muscles:  No atrophy.  No edema.  Biceps long head:  Intact.  Acromioclavicular Joint: Type 2 acromion. No significant AC joint osteoarthritis. Negative for bursitis.  Glenohumeral Joint: Normal.  Labrum:  Normal.  Bones:  No significant extra-articular findings.  IMPRESSION: Mild SUPRASPINATUS tendinopathy. No rotator cuff tear or bursitis. Study moderately degraded by motion artifact.   Electronically  Signed   By: Andreas Newport M.D.   On: 06/04/2015 10:24     Assessment/Plan: 39 y.o. female  with a known history of stroke, chronic right-sided weakness is present into the ED with a chief complaint of generalized weakness that started a week prior and has been progressing. No prior history of similar symptoms. Concern for GBS. Pt is s/p LP with clear cytoalbuminological dissociation (elevated protein with normal cell count). MRI C and T spine no signs of transverse myelitis and inflamed roots.  Pt states she was able to ambulate with limited assistance along with PT Bells Palsy on R side improved slightly. Speech improved  - d/c planning - medrol 5-7 taper  - d/c planning  - out pt follow up. If symptoms do not improved or get to baseline- neurology eval and possibly EMG/NCS as out pt.     Pauletta Browns  06/04/2015, 2:29 PM

## 2015-06-04 NOTE — Progress Notes (Signed)
   06/04/15 1800  Clinical Encounter Type  Visited With Patient  Visit Type Initial  Consult/Referral To Chaplain  Spiritual Encounters  Spiritual Needs Prayer  Stress Factors  Patient Stress Factors Health changes  Chaplain rounded in unit and offered spiritual support.  Chaplain Chevelle Durr [phone]

## 2015-06-04 NOTE — Progress Notes (Signed)
Speech Language Pathology Treatment: Dysphagia  Patient Details Name: Alexandria Murray MRN: [id number] DOB: 03-20-1976 Today's Date: 06/04/2015 Time:  -     Assessment / Plan / Recommendation Clinical Impression  Pt presents with improved speech over past few days and reports toleration of current pureed diet with thin liquids. Noted mild dysarthria but all speech was intelligible. Trials of solid graham cracker provided today and Pt was able to self feed. Pt tolerated solids and liquids without s/s of aspiration. Noted slight oral transit delay as Pt was mindful of chewing and swallowing carefully. Min to no oral residue after the swallow even prior to alternating liquids. Vocal quality remained clear. Pt reports she wants to try solid foods as meals. Rec. Dys 3 diet with chopped meats. Encouraged Pt to avoid any foods that she thinks might be too tough. Discussed planned meals for discharge home.   HPI     Pertinent Vitals Pain Assessment: No/denies pain  SLP Plan    Alter diet to Dys 3 with chopped meats. F/u 1-2 days if Pt is still here   Recommendations  soft solids when discharged home                   GO     Eather Colas 06/04/2015, 2:24 PM

## 2015-06-04 NOTE — Progress Notes (Signed)
Patients NIF greater than -40

## 2015-06-04 NOTE — Plan of Care (Signed)
Problem: Discharge Progression Outcomes Goal: Other Discharge Outcomes/Goals Outcome: Progressing Plan of care progress to goal: Patient's pain managed with toradol iv,                                                   Patient swallows much better, able to swallow pills one at a time whole                                                  Patient's v/s stable                                                  Patient ambulates to bathroom with walker with one assist. Patient states her feeling in right side is coming back, remains weaker on right side.                                                   Patient with orders for x-ray of left shoulder.

## 2015-06-04 NOTE — Progress Notes (Signed)
Ms. Alexandria Murray NIF was greater than -40.

## 2015-06-05 DIAGNOSIS — M778 Other enthesopathies, not elsewhere classified: Secondary | ICD-10-CM

## 2015-06-05 DIAGNOSIS — M7582 Other shoulder lesions, left shoulder: Secondary | ICD-10-CM

## 2015-06-05 DIAGNOSIS — G510 Bell's palsy: Secondary | ICD-10-CM

## 2015-06-05 DIAGNOSIS — I10 Essential (primary) hypertension: Secondary | ICD-10-CM

## 2015-06-05 MED ORDER — TRAMADOL HCL 50 MG PO TABS: 50.0000 mg | ORAL_TABLET | Freq: Four times a day (QID) | ORAL | Status: AC | PRN

## 2015-06-05 MED ORDER — OXYCODONE HCL 10 MG PO TABS: 10.0000 mg | ORAL_TABLET | Freq: Four times a day (QID) | ORAL | Status: DC | PRN

## 2015-06-05 MED ORDER — ADULT MULTIVITAMIN W/MINERALS CH: 1.0000 | ORAL_TABLET | Freq: Every day | ORAL | Status: AC

## 2015-06-05 MED ORDER — METHYLPREDNISOLONE 4 MG PO TBPK: ORAL_TABLET | ORAL | Status: DC

## 2015-06-05 MED ORDER — NICOTINE 10 MG IN INHA: 1.0000 | RESPIRATORY_TRACT | Status: DC | PRN

## 2015-06-05 MED ORDER — HYDRALAZINE HCL 25 MG PO TABS: 25.0000 mg | ORAL_TABLET | Freq: Three times a day (TID) | ORAL | Status: DC

## 2015-06-05 MED ORDER — LISINOPRIL 10 MG PO TABS: 5.0000 mg | ORAL_TABLET | Freq: Every day | ORAL | Status: DC

## 2015-06-05 MED ORDER — DOCUSATE SODIUM 100 MG PO CAPS: 100.0000 mg | ORAL_CAPSULE | Freq: Two times a day (BID) | ORAL | Status: AC

## 2015-06-05 NOTE — Discharge Summary (Signed)
[facility] - [place] at [facility]   PATIENT NAME: Alexandria Murray    MR#:  [id number]  DATE OF BIRTH:  Oct 22, 1976  DATE OF ADMISSION:  05/28/2015 ADMITTING PHYSICIAN: Ramonita Lab, MD  DATE OF DISCHARGE: 06/05/2015 12:49 PM  PRIMARY CARE PHYSICIAN: No PCP Per Patient     ADMISSION DIAGNOSIS:  Guillain-Barre syndrome [G61.0] Cystitis [N30.90] Weakness of both lower extremities [R29.898]  DISCHARGE DIAGNOSIS:  Principal Problem:   Guillain Barr syndrome Active Problems:   Bell's palsy   Left shoulder tendonitis   Smoker   HTN (hypertension)   SECONDARY DIAGNOSIS:   Past Medical History  Diagnosis Date  . Stroke     .pro HOSPITAL COURSE:   Patient is 39 year old Caucasian female with past medical history significant for history of stroke with right-sided weakness who presents to the hospital with complaints of worsening weakness in lower extremities, also bilateral shoulder pain. Patient underwent lumbar puncture on 05/28/2015 which revealed a high protein and low white blood cell count so called Cytoalbunolical dissociation. Patient had CT scan of the head, which was remarkable for old infarct. She was initiated on IVIG for presumed  Guillain- Barr syndrome by neurologist recommendations. With conservative therapy her condition slowly improved, she had physical therapy while in the hospital and recommended to return home with home health physical therapy. Patient completed 7 day IVIG therapy. While in the hospital she was also noted to have right facial weakness consistent with Bell's palsy. She was not given steroids while she was on IVIG. However, patient is to continue steroids taper upon discharge with Medrol. Her facial weakness also improved. Patient had MRI of thoracic and lumbar spine which revealed diffuse enhancement of nerve roots diagnostic for Guillain-Barr. Because patient was having difficulty swallowing and facial weakness. 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 This physical therapy and conservative management, as well as steroids. Her condition significantly improved and she was ready to be discharged home on 06/05/2015. Of note she was seen by orthopedist surgeon for left shoulder pains and underwent MR arthrogram of left shoulder showing mild supraspinatus tendinopathy on the left. Physical therapy as well as steroids taper was recommended and follow-up with orthopedist surgeon as outpatient Discussion by problem 1. GB syndrome;  Completed IV IG for a total of 7 days. Discharge home with home health PT and RN today since orthopedic workup is completed. Continue physical therapy ,  speech therapy, steroids taper. Patient is to follow-up with neurology and orthopedic surgeon as outpatient  2. Dysphagia due to Bell's palsy . Past repeat swallow evaluation - speech has recommended to start the patient on pure diet with thin liquids  anti-GQ1b antibody in blood taken and pending, to make sure pt does not have diagnosis if Christianne Dolin variant of GBS with bulbar involvement per neurology recommendation 3. Bell's palsy, valacyclovir is not effective, per neurology, continue Medrol Dosepak to complete course 4. . Pyuria, likely contamination  IV rocpehin discontinued  cultures-probably contaminated specimen and it has multiple species  5. Copd no wheezing Nebulizer treatments as needed basis  ,6h/o tobacco abuse-counseled patient to quit smoking, discussed this patient for approximately 5 minutes a nicotine replacement therapy with nicotine inhaler will be initiated  7.htn; poorly controlled. Initiate patient on lisinopril at 5 mg daily dose advance it as needed as outpatient Monitor closely for possible respiratory depression related to  GB sndrome  5 . Bilateral. Shoulder pains, suspected  rotator cuff injury . Initially, but was noted to have only supraspinatus tendinitis. Dr. Martha Clan recommended steroids taper and the physical therapy and follow-up with him as outpatient  DISCHARGE CONDITIONS:   Stable  CONSULTS OBTAINED:  Treatment Team:  Ramonita Lab, MD Katharina Caper, MD Juanell Fairly, MD  DRUG ALLERGIES:  No Known Allergies  DISCHARGE MEDICATIONS:   Discharge Medication List as of 06/05/2015 10:26 AM    START taking these medications   Details  docusate sodium (COLACE) 100 MG capsule Take 1 capsule (100 mg total) by mouth 2 (two) times daily., Starting 06/05/2015, Until Discontinued, Normal    lisinopril (ZESTRIL) 10 MG tablet Take 0.5 tablets (5 mg total) by mouth daily., Starting 06/05/2015, Until Discontinued, Normal    methylPREDNISolone (MEDROL DOSEPAK) 4 MG TBPK tablet follow package directions, Normal    Multiple Vitamin (MULTIVITAMIN WITH MINERALS) TABS tablet Take 1 tablet by mouth daily., Starting 06/05/2015, Until Discontinued, Normal    nicotine (NICOTROL) 10 MG inhaler Inhale 1 cartridge (1 continuous puffing total) into the lungs as needed for smoking cessation., Starting 06/05/2015, Until Discontinued, Normal    oxyCODONE 10 MG TABS Take 1 tablet (10 mg total) by mouth every 6 (six) hours as needed for severe pain., Starting 06/05/2015, Until Discontinued, Print      CONTINUE these medications which have CHANGED   Details  traMADol (ULTRAM) 50 MG tablet Take 1 tablet (50 mg total) by mouth every 6 (six) hours as needed for moderate pain or severe pain., Starting 06/05/2015, Until Wed 06/04/16, Print      CONTINUE these medications which have NOT CHANGED   Details  acetaminophen (TYLENOL) 500 MG tablet Take 1,000 mg by mouth every 6 (six) hours as needed for mild pain., Until Discontinued, Historical Med    Aspirin-Acetaminophen-Caffeine (PAMPRIN MAX PO) Take 2 tablets by mouth daily., Until Discontinued, Historical Med    ferrous sulfate 325 (65 FE)  MG tablet Take 325 mg by mouth daily., Until Discontinued, Historical Med    Multiple Vitamins-Minerals (MULTIVITAMIN PO) Take 1 tablet by mouth daily., Until Discontinued, Historical Med    ondansetron (ZOFRAN ODT) 4 MG disintegrating tablet Take 1 tablet (4 mg total) by mouth every 8 (eight) hours as needed for nausea or vomiting., Starting 05/24/2015, Until Discontinued, Print    oxyCODONE-acetaminophen (PERCOCET/ROXICET) 5-325 MG per tablet Take 1 tablet by mouth every 6 (six) hours as needed for severe pain., Starting 05/22/2015, Until Discontinued, Print      STOP taking these medications     ibuprofen (ADVIL,MOTRIN) 600 MG tablet          DISCHARGE INSTRUCTIONS:    Please follow-up with primary care physician neurology and orthopedist surgeon in the next 1-2 weeks after discharge  If you experience worsening of your admission symptoms, develop shortness of breath, life threatening emergency, suicidal or homicidal thoughts you must seek medical attention immediately by calling 911 or calling your MD immediately  if symptoms less severe.  You Must read complete instructions/literature along with all the possible adverse reactions/side effects for all the Medicines you take and that have been prescribed to you. Take any new Medicines after you have completely understood and accept all the possible adverse reactions/side effects.   Please note  You were cared for by a hospitalist during your hospital stay. If you have any questions about your discharge medications or the care you received while you were in the hospital after you are  discharged, you can call the unit and asked to speak with the hospitalist on call if the hospitalist that took care of you is not available. Once you are discharged, your primary care physician will handle any further medical issues. Please note that NO REFILLS for any discharge medications will be authorized once you are discharged, as it is imperative that  you return to your primary care physician (or establish a relationship with a primary care physician if you do not have one) for your aftercare needs so that they can reassess your need for medications and monitor your lab values.    Today   CHIEF COMPLAINT:   Chief Complaint  Patient presents with  . Weakness    HISTORY OF PRESENT ILLNESS:  Alexandria Murray  is a 39 y.o. female with a known history of stroke with right-sided weakness, ongoing tobacco abuse who presents to the hospital with complaints of worsening weakness in lower extremities, also bilateral shoulder pain. Patient underwent lumbar puncture on 05/28/2015 which revealed a high protein and low white blood cell count so called Cytoalbunolical dissociation. Patient had CT scan of the head, which was remarkable for old infarct. She was initiated on IVIG for presumed  Guillain- Barr syndrome by neurologist recommendations. With conservative therapy her condition slowly improved, she had physical therapy while in the hospital and recommended to return home with home health physical therapy. Patient completed 7 day IVIG therapy. While in the hospital she was also noted to have right facial weakness consistent with Bell's palsy. She was not given steroids while she was on IVIG. However, patient is to continue steroids taper upon discharge with Medrol. Her facial weakness also improved. Patient had MRI of thoracic and lumbar spine which revealed diffuse enhancement of nerve roots diagnostic for Guillain-Barr. Because patient was having difficulty swallowing and facial weakness. 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 This physical therapy and conservative management, as well as steroids. Her condition significantly improved and she was ready to be discharged home on 06/05/2015. Of note she was seen by orthopedist  surgeon for left shoulder pains and underwent MR arthrogram of left shoulder showing mild supraspinatus tendinopathy on the left. Physical therapy as well as steroids taper was recommended and follow-up with orthopedist surgeon as outpatient Discussion by problem 1. GB syndrome;  Completed IV IG for a total of 7 days. Discharge home with home health PT and RN today since orthopedic workup is completed. Continue physical therapy ,  speech therapy, steroids taper. Patient is to follow-up with neurology and orthopedic surgeon as outpatient  2. Dysphagia due to Bell's palsy . Past repeat swallow evaluation - speech has recommended to start the patient on pure diet with thin liquids  anti-GQ1b antibody in blood taken and pending, to make sure pt does not have diagnosis if Christianne Dolin variant of GBS with bulbar involvement per neurology recommendation 3. Bell's palsy, valacyclovir is not effective, per neurology, continue Medrol Dosepak to complete course 4. . Pyuria, likely contamination  IV rocpehin discontinued  cultures-probably contaminated specimen and it has multiple species  5. Copd no wheezing Nebulizer treatments as needed basis  ,6h/o tobacco abuse-counseled patient to quit smoking, discussed this patient for approximately 5 minutes a nicotine replacement therapy with nicotine inhaler will be initiated  7.htn; poorly controlled. Initiate patient on lisinopril at 5 mg daily dose advance it as needed as outpatient  Monitor closely for possible respiratory depression related to GB sndrome  5 . Bilateral. Shoulder pains, suspected rotator cuff injury . Initially, but was noted to have only supraspinatus tendinitis. Dr. Martha Clan recommended steroids taper and the physical therapy and follow-up with him as outpatient     VITAL SIGNS:  Blood pressure 150/94, pulse 86, temperature 99 F (37.2 C), temperature source Oral, resp. rate 18, height 5\' 1"  (1.549 m), weight 65.318 kg  (144 lb), last menstrual period 05/26/2015, SpO2 98 %.  I/O:  No intake or output data in the 24 hours ending 06/05/15 1352  PHYSICAL EXAMINATION:  GENERAL:  39 y.o.-year-old patient lying in the bed with no acute distress.  EYES: Pupils equal, round, reactive to light and accommodation. No scleral icterus. Extraocular muscles intact.  HEENT: Head atraumatic, normocephalic. Oropharynx and nasopharynx clear.  NECK:  Supple, no jugular venous distention. No thyroid enlargement, no tenderness.  LUNGS: Normal breath sounds bilaterally, no wheezing, rales,rhonchi or crepitation. No use of accessory muscles of respiration.  CARDIOVASCULAR: S1, S2 normal. No murmurs, rubs, or gallops.  ABDOMEN: Soft, non-tender, non-distended. Bowel sounds present. No organomegaly or mass.  EXTREMITIES: No pedal edema, cyanosis, or clubbing.  NEUROLOGIC: Cranial nerves II through XII are intact. Muscle strength 5/5 in all extremities. Sensation intact. Gait not checked.  PSYCHIATRIC: The patient is alert and oriented x 3.  SKIN: No obvious rash, lesion, or ulcer.   DATA REVIEW:   CBC  Recent Labs Lab 06/02/15 1101  WBC 5.0  HGB 15.0  HCT 42.4  PLT 146*    Chemistries   Recent Labs Lab 06/01/15 0343 06/04/15 0437  NA 136  --   K 3.6  --   CL 107  --   CO2 23  --   GLUCOSE 112*  --   BUN 27*  --   CREATININE 0.52 0.52  CALCIUM 8.7*  --     Cardiac Enzymes No results for input(s): TROPONINI in the last 168 hours.  Microbiology Results  Results for orders placed or performed during the hospital encounter of 05/28/15  Urine culture     Status: None   Collection Time: 05/28/15  3:25 AM  Result Value Ref Range Status   Specimen Description URINE, CLEAN CATCH  Final   Special Requests NONE  Final   Culture MULTIPLE SPECIES PRESENT, SUGGEST RECOLLECTION  Final   Report Status 05/30/2015 FINAL  Final  CSF culture     Status: None   Collection Time: 05/28/15  3:48 PM  Result Value Ref Range  Status   Specimen Description CSF  Final   Special Requests Normal  Final   Gram Stain NO BACTERIA SEEN RARE WBC CONFIRMED BY TFK   Final   Culture NO GROWTH 3 DAYS  Final   Report Status 05/31/2015 FINAL  Final  MRSA PCR Screening     Status: None   Collection Time: 05/28/15  9:30 PM  Result Value Ref Range Status   MRSA by PCR NEGATIVE NEGATIVE Final    Comment:        The GeneXpert MRSA Assay (FDA approved for NASAL specimens only), is one component of a comprehensive MRSA colonization surveillance program. It is not intended to diagnose MRSA infection nor to guide or monitor treatment for MRSA infections.     RADIOLOGY:  Mr Shoulder Left Wo Contrast  06/04/2015   CLINICAL DATA:  Acute LEFT shoulder pain. Chronic right-sided weakness. Patient presented with generalized weakness.  EXAM: MRI OF THE LEFT  SHOULDER WITHOUT CONTRAST  TECHNIQUE: Multiplanar, multisequence MR imaging of the shoulder was performed. No intravenous contrast was administered.  COMPARISON:  None.  FINDINGS: Study is moderately degraded by patient motion. Technical parameters required modification to obtain a diagnostic study.  Rotator cuff:  Intact with tendinopathy of SUPRASPINATUS.  Muscles:  No atrophy.  No edema.  Biceps long head:  Intact.  Acromioclavicular Joint: Type 2 acromion. No significant AC joint osteoarthritis. Negative for bursitis.  Glenohumeral Joint: Normal.  Labrum:  Normal.  Bones:  No significant extra-articular findings.  IMPRESSION: Mild SUPRASPINATUS tendinopathy. No rotator cuff tear or bursitis. Study moderately degraded by motion artifact.   Electronically Signed   By: Andreas Newport M.D.   On: 06/04/2015 10:24    EKG:   Orders placed or performed during the hospital encounter of 05/28/15  . ED EKG  . ED EKG  . EKG 12-Lead  . EKG 12-Lead      Management plans discussed with the patient, family and they are in agreement.  CODE STATUS:     Code Status Orders         Start     Ordered   05/28/15 2111  Full code   Continuous     05/28/15 2111      TOTAL TIME TAKING CARE OF THIS PATIENT: 40 minutes.    Katharina Caper M.D on 06/05/2015 at 1:52 PM  Between 7am to 6pm - Pager - [phone]  After 6pm go to [email or web address] - password [email or web address] [facility]  [facility]  Office  [phone]  CC: Primary care physician; No PCP Per Patient

## 2015-06-05 NOTE — Discharge Instructions (Signed)
Dysphagia Level 3 Diet, Mechanically Advanced °The dysphagia level 3 diet includes foods that are soft, moist, and can be chopped into 1-inch chunks. This diet is helpful for people with mild swallowing difficulties. It reduces the risk of food getting caught in the windpipe, trachea, or lungs. °WHAT DO I NEED TO KNOW ABOUT THIS DIET? °· You may eat foods that are soft and moist. °· If you were on the dysphagia level 1 or level 2 diets, you may eat any of the foods included on those lists. °· Avoid foods that are dry, hard, sticky, chewy, coarse, and crunchy. Also avoid large cuts of food. °· Take small bites. Each bite should contain 1 inch or less of food. °· Thicken liquids if instructed by your health care provider. Follow your health care provider's instructions on how to do this and to what consistency. °· See your dietitian or speech language pathologist regularly for help with your dietary changes. °WHAT FOODS CAN I EAT? °Grains °Moist breads without nuts or seeds. Biscuits, muffins, pancakes, and waffles well-moistened with syrup, jelly, margarine, or butter. Smooth cereals with plenty of milk to moisten them. Moist bread stuffing. Moist rice. °Vegetables °All cooked, soft vegetables. Shredded lettuce. Tender fried potatoes. °Fruits °All canned and cooked fruits. Soft, peeled fresh fruits, such as peaches, nectarines, kiwis, cantaloupe, honeydew melon, and watermelon without seeds. Soft berries, such as strawberries. °Meat and Other Protein Sources °Moist ground or finely diced or sliced meats. Solid, tender cuts of meat. Meatloaf. Hamburger with a bun. Sausage patty. Deli thin-sliced lunch meat. Chicken, egg, or tuna salad sandwich. Sloppy joe. Moist fish. Eggs prepared any way. Casseroles with small chunks of meats, ground meats, or tender meats. °Dairy °Cheese spreads without coarse large chunks. Shredded cheese. Cheese slices. Cottage cheese. Milk at the right texture. Smooth frappes. Yogurt without  nuts or coconut. Ask your health care provider whether you can have frozen desserts (such as malts or milk shakes) and thin liquids. °Sweets/Desserts °Soft, smooth, moist desserts. Non-chewy, smooth candy. Jam. Jelly. Honey. Preserves. Ask your health care provider whether you can have frozen desserts. °Fats and Oils °Butter. Oils. Margarine. Mayonnaise. Gravy. Spreads. °Other °All seasonings and sweeteners. All sauces without large chunks. °The items listed above may not be a complete list of recommended foods or beverages. Contact your dietitian for more options. °WHAT FOODS ARE NOT RECOMMENDED? °Grains °Coarse or dry cereals. Dry breads. Toast. Crackers. Tough, crusty breads, such French bread and baguettes. Tough, crisp fried potatoes. Potato skins. Dry bread stuffing. Granola. Popcorn. Chips. °Vegetables °All raw vegetables except shredded lettuce. Cooked corn. Rubbery or stiff cooked vegetables. Stringy vegetables, such as celery. °Fruits °Hard fruits that are difficult to chew, such as apples or pears. Stringy, high-pulp fruits, such as pineapple, papaya, or mango. Fruits with tough skins, such as grapes. Coconut. All dried fruits. Fruit leather. Fruit roll-ups. Fruit snacks. °Meat and Other Protein Sources °Dry or tough meats or poultry. Dry fish. Fish with bones. Peanut butter. All nuts and seeds. °Dairy  °Any with nuts, seeds, chocolate chips, dried fruit, coconut, or pineapple. °Sweets/Desserts °Dry cakes. Chewy or dry cookies. Any with nuts, seeds, dry fruits, coconut, pineapple, or anything dry, sticky, or hard. Chewy caramel. Licorice. Taffy-type candies. Ask your health care provider whether you can have frozen desserts. °Fats and Oils °Any with chunks, nuts, seeds, or pineapple. Olives. Pickles. °Other °Soups with tough or large chunks of meats, poultry, or vegetables. Corn or clam chowder. °The items listed above may not be   a complete list of foods and beverages to avoid. Contact your dietitian for  more information. Document Released: 10/13/2005 Document Revised: 06/15/2013 Document Reviewed: 09/26/2013 [facility] Patient Information 2015 [place], [place]. This information is not intended to replace advice given to you by your health care provider. Make sure you discuss any questions you have with your health care provider.  Guillain-Barre Syndrome Guillain-Barr syndrome is a disorder in which the body's protection (immune) system attacks part of the nervous system. This syndrome is rare. Reyes Ivan is called a syndrome rather than a disease because it is not clear that a specific disease-causing agent is involved. CAUSES   Usually Guillain-Barr occurs a few days or weeks after the patient has had symptoms of a viral infection:  Breathing (respiratory).  Stomach (gastrointestinal).  Sometimes, surgery or vaccinations will set off the syndrome.  The disorder can develop over the course of hours or days. Or it may take up to 3 to 4 weeks.  No one yet knows why Guillain-Barr strikes some people and not others. Nor do they know what sets the disease in motion.  What scientists do know is that the body's immune system begins to attack the body itself. This causes what is known as an autoimmune disease. SYMPTOMS  The first problems (symptoms) of this disorder include varying degrees of weakness or tingling sensations (feeling) in the legs. In many cases, the weakness and abnormal sensations spread to the arms and upper body. These symptoms may worsen until the muscles cannot be used at all. Then the patient is almost totally paralyzed. In these cases, the disorder is life-threatening. It is considered a medical emergency. The patient is often put on a respirator to assist with breathing. Most patients recover from even the most severe cases of this syndrome. But some continue to have some degree of weakness. DIAGNOSIS  Reyes Ivan is called a syndrome rather than a disease because it  is not clear that a specific disease-causing agent is involved. Reflexes such as knee jerks are usually lost. Signals traveling along the nerve are slower. So a nerve conduction velocity (NCV) test can give caregivers clues to aid the diagnosis. This means they try to learn what is wrong. The cerebrospinal fluid (CSF) that bathes the spinal cord and brain contains more protein than usual. So a caregiver may decide to perform a spinal tap. TREATMENT  There is no known cure for this syndrome. But treatments can give some relief and speed up the recovery in most patients. There are also a number of ways to treat the complications of the syndrome. Currently, plasmapheresis and high-dose immunoglobulin therapy are used. Plasmapheresis seems to reduce the severity and length of the Guillain-Barr episode. In high-dose immunoglobulin therapy, caregivers give intravenous injections of the proteins that, in small quantities, the immune system uses naturally to attack invading organisms. Researchers have found that giving high doses of these immunoglobulins to patients can lessen the immune system attack on the nervous system. The most important part of the treatment for this syndrome is keeping the patient's body functioning during recovery of the nervous system. This can sometimes require placing the patient on:  A respirator.  A heart monitor.  Other machines that assist body function. This syndrome can be devastating due to its sudden and unexpected beginning. Most people reach the stage of greatest weakness within the first 2 weeks after symptoms appear. By the third week of the illness, 90 percent of all patients are at their weakest. The recovery period may  be as little as a few weeks. Or it can be as long as a few years. About 30 percent of patients still have a remaining weakness after 3 years. About 3 percent may suffer a return of muscle weakness and tingling sensations many years after the initial  attack. RESEARCH BEING DONE Scientists are concentrating on finding new treatments and refining existing ones. They are also looking at the workings of the immune system. They want to find which cells are responsible for beginning and carrying out the attack on the nervous system. The fact that so many cases of this syndrome begin after a viral or germ (bacterial ) infection suggests that certain characteristics of some viruses and bacteria may activate the immune system improperly. Investigators are searching for those characteristics. Neurological scientists, immunologists, virologists, and pharmacologists are all working together:  To learn how to prevent this disorder.  To make better therapies available when it strikes. FOR MORE INFORMATION Guillain-Barre Syndrome [employer] www.gbs-cipd.org Document Released: 10/03/2002 Document Revised: 01/05/2012 Document Reviewed: 12/14/2013 [facility] Patient Information 2015 [place], [place]. This information is not intended to replace advice given to you by your health care provider. Make sure you discuss any questions you have with your health care provider.

## 2015-06-05 NOTE — Progress Notes (Signed)
        To whom it May concern:     Mrs. Alexandria Murray was hospitalized at [facility] from 05/28/2015 to 06/05/2015. She is excused from work through 06/19/2015. Thank you for your understanding.     Sincerely   Katharina Caper, MD

## 2015-06-05 NOTE — Care Management (Signed)
Spoke with Alexandria Murray at the bedside. States she has a follow-up appointment for August 22 at [facility].  Rolling Walker arranged thru Will Dareen Piano at [facility]. Will be getting Nursing and Physical therapy per [facility]. Spoke with Feliberto Gottron, representative for [facility]. Dr. Winona Legato will sign for the first Home Health visit. Then she will be followed by [facility]. Feliberto Gottron will see if [facility] can take this case. Physical therapist will teach home exercises Husband will transport. Gwenette Greet RN MSN Care Management [phone]

## 2015-06-05 NOTE — Progress Notes (Signed)
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  Received RW during treatment session and fitted appropriately.  Pt able to demonstrate HEP with min cues after instruction.  Will be d/c with HHPT  Follow Up Recommendations  Home health PT     Equipment Recommendations  Rolling walker with 5" wheels    Recommendations for Other Services       Precautions / Restrictions Precautions Precautions: Fall Restrictions Weight Bearing Restrictions: No    Mobility  Bed Mobility Overal bed mobility: Modified Independent Bed Mobility: Supine to Sit;Sit to Supine     Supine to sit: Modified independent (Device/Increase time) Sit to supine: Modified independent (Device/Increase time)   General bed mobility comments: Pt elevated HOB to assit with trf, and bed rails for positioning  Transfers                    Ambulation/Gait                 Stairs            Wheelchair Mobility    Modified Rankin (Stroke Patients Only)       Balance                                    Cognition Arousal/Alertness: Awake/alert Behavior During Therapy: WFL for tasks assessed/performed Overall Cognitive Status: Within Functional Limits for tasks assessed                      Exercises Other Exercises Other Exercises: Table slides for shoulder capsule stretching, scap pinches and shldr elevation for periscapular strengthening    General Comments General comments (skin integrity, edema, etc.): performed manual techniques for capsule stretching      Pertinent Vitals/Pain Pain Assessment: No/denies pain    Home Living                       Prior Function            PT Goals (current goals can now be found in the care plan section) Acute Rehab PT Goals PT Goal Formulation: With patient Time For Goal Achievement: 06/13/15 Potential to Achieve Goals: Good Progress towards PT goals: Progressing toward goals    Frequency  7X/week    PT Plan Current plan remains appropriate    Co-evaluation             End of Session Equipment Utilized During Treatment: Gait belt Activity Tolerance: Patient tolerated treatment well Patient left: in bed;with call bell/phone within reach     Time: 1000-1040 PT Time Calculation (min) (ACUTE ONLY): 40 min  Charges:  $Therapeutic Exercise: 8-22 mins $Therapeutic Activity: 23-37 mins                    G Codes:      Jameil Whitmoyer 2015/06/15, 11:03 AM  Maven Varelas, PTA

## 2015-06-05 NOTE — Plan of Care (Signed)
Problem: Discharge Progression Outcomes Goal: Other Discharge Outcomes/Goals Outcome: Progressing Plan of care progress to goal: Patient admitted on 05/28/2015 with gullain-barre syndrome, weakness of bilat lower extremities, increased weakness in right side/ Patient takes medications whole, receiving steroid taper pack starting today. She takes motrin for pain or tramadol with good effects. Patient ambulates with walker with assist. Patient still complains of numbness bilat extremities.  V/s stable.

## 2015-06-05 NOTE — Progress Notes (Signed)
NIF is greater than -40.

## 2015-06-05 NOTE — Progress Notes (Signed)
Pt discharged home per MD order. IV removed. Walker delivered to patient. Discharge instructions given to patient, prescriptions given to patient. All questions answered. Patient verbalized understanding. Patient left via wheelchair with auxiliary and family.

## 2015-06-11 LAB — MISC LABCORP TEST (SEND OUT)

## 2015-06-20 DIAGNOSIS — R87619 Unspecified abnormal cytological findings in specimens from cervix uteri: Secondary | ICD-10-CM

## 2015-06-20 HISTORY — DX: Unspecified abnormal cytological findings in specimens from cervix uteri: R87.619

## 2015-06-22 DIAGNOSIS — Z8673 Personal history of transient ischemic attack (TIA), and cerebral infarction without residual deficits: Secondary | ICD-10-CM | POA: Insufficient documentation

## 2015-07-10 ENCOUNTER — Encounter: Payer: Self-pay | Admitting: Occupational Therapy

## 2015-07-10 ENCOUNTER — Ambulatory Visit: Payer: Managed Care, Other (non HMO) | Attending: Neurology | Admitting: Occupational Therapy

## 2015-07-10 DIAGNOSIS — M6281 Muscle weakness (generalized): Secondary | ICD-10-CM

## 2015-07-10 DIAGNOSIS — R279 Unspecified lack of coordination: Secondary | ICD-10-CM | POA: Diagnosis present

## 2015-07-10 DIAGNOSIS — Z741 Need for assistance with personal care: Secondary | ICD-10-CM

## 2015-07-10 DIAGNOSIS — R460 Very low level of personal hygiene: Secondary | ICD-10-CM | POA: Diagnosis present

## 2015-07-10 DIAGNOSIS — Z789 Other specified health status: Secondary | ICD-10-CM

## 2015-07-10 DIAGNOSIS — G610 Guillain-Barre syndrome: Secondary | ICD-10-CM | POA: Diagnosis present

## 2015-07-10 DIAGNOSIS — Z658 Other specified problems related to psychosocial circumstances: Secondary | ICD-10-CM | POA: Diagnosis present

## 2015-07-11 NOTE — Therapy (Signed)
[facility] MAIN [facility] SERVICES [address] [place], [place], [address] Phone: [phone]   Fax:  [phone]  Occupational Therapy Evaluation  Patient Details  Name: Alexandria Murray MRN: [id number] Date of Birth: [birth year] Referring Provider:  Morene Crocker, MD  Encounter Date: 07/10/2015      OT End of Session - 07/10/15 1603    Visit Number 1   Number of Visits 16   Date for OT Re-Evaluation 09/06/15   OT Start Time 1001   OT Stop Time 1058   OT Time Calculation (min) 57 min   Activity Tolerance Patient tolerated treatment well   Behavior During Therapy [facility] for tasks assessed/performed      Past Medical History  Diagnosis Date  . Stroke     Past Surgical History  Procedure Laterality Date  . Cholecystectomy    . Finger surgery      There were no vitals filed for this visit.  Visit Diagnosis:  Guillain Barr syndrome - Plan: Ot plan of care cert/re-cert  Muscle weakness (generalized) - Plan: Ot plan of care cert/re-cert  Lack of coordination - Plan: Ot plan of care cert/re-cert  Self-care deficit for dressing and grooming - Plan: Ot plan of care cert/re-cert  Decreased independence with light homemaking - Plan: Ot plan of care cert/re-cert         [facility] OT Assessment - 07/10/15 1014    Assessment   Diagnosis Guillain Barre   Onset Date 05/23/15   Prior Therapy OT, PT   Balance Screen   Has the patient fallen in the past 6 months Yes   How many times? 2   Has the patient had a decrease in activity level because of a fear of falling?  No   Is the patient reluctant to leave their home because of a fear of falling?  No   Home  Environment   Family/patient expects to be discharged to: Private residence   Living Arrangements Spouse/significant other   Available Help at Discharge Family   Type of Home House   Home Access Stairs   Home Layout One level   Bathroom Shower/Tub Tub/Shower unit;Curtain   Psychologist, educational - single point   Lives With Spouse;Son;Daughter   Prior Function   Level of Independence Independent   Vocation Full time employment   Vocation Requirements on short term disability, works for [employer], works on [employer], 4 jobs (press pans, lock pin, drilling, line job-final inspection)   ADL   Eating/Feeding Independent  difficulty with cutting meat   Grooming Minimal assistance   Upper Body Bathing Independent   Lower Body Bathing Modified independent   Upper Body Dressing Increased time;Independent   Lower Body Dressing Modified independent;Increased time;Needs assist for fasteners   Toilet Tranfer Modified independent   Toileting -  Hygiene Modified Independent   Tub/Shower Transfer Supervision/safety   ADL comments Patient reports she is home alone during the day, takes naps throughout the day as needed since she now has poor endurance and tolerance to activity.  She reports she is unable to lift a gallon of milk, cannot squeeze the shampoo out of the bottle, difficulty with food preparation, managing a heavy pot such as draining the water from pasta, turning in bed, transitioning from supine to sitting, reaching for and lifting laundry detergent from the shelf, reaching to turn on the washing machine, getting laundry from the washer to the dryer, carrying  a laundry basket, sweeping the floor, difficulty hanging clothes in the closet and retrieving items,  unable to grocery shop, reaching to brush her hair, negotiating the back steps (8-10) and she is also unable to work at this time (normally works 3rd shift).   IADL   Prior Level of Function Shopping independent   Shopping Needs to be accompanied on any shopping trip   Prior Level of Function Light Housekeeping independent   Light Housekeeping Needs help with all home maintenance tasks   Prior Level of Function Meal Prep independent   Meal Prep Able to complete simple cold meal and snack prep    Community Mobility Drives own vehicle   Medication Management Is responsible for taking medication in correct dosages at correct time   Mobility   Mobility Status Independent   Mobility Status Comments straight can   Written Expression   Dominant Hand Left   Coordination   Gross Motor Movements are Fluid and Coordinated No   Fine Motor Movements are Fluid and Coordinated No   9 Hole Peg Test Right;Left   Right 9 Hole Peg Test 28   Left 9 Hole Peg Test 26   ROM / Strength   AROM / PROM / Strength AROM;Strength   AROM   Overall AROM Comments Right shoulder flexion 152 degrees, left shoulder flexion 125 degrees with effort, R ABD of shoulder full range, L 70 degrees.   Strength   Overall Strength Comments Strength in RUE shoulder flexion 3+/5, elbow flexion 3/5, elbow extension 4-/5.  LUE  shoulder flexion 3-/5, elbow flexion 3-/5, extension 3+/5   Hand Function   Right Hand Grip (lbs) 19   Right Hand Lateral Pinch 9 lbs   Right Hand 3 Point Pinch 7 lbs   Left Hand Grip (lbs) 30   Left Hand Lateral Pinch 10 lbs   Left 3 point pinch 9 lbs                         OT Education - 07/11/15 1641    Education provided Yes   Education Details exercises for home program   Person(s) Educated Patient   Methods Explanation;Demonstration;Verbal cues   Comprehension Verbal cues required;Returned demonstration;Verbalized understanding             OT Long Term Goals - 07/11/15 1646    OT LONG TERM GOAL #1   Title Patient will improve strength in LUE by 1 mm grade to lift milk from refrigerator to table.   Baseline unable    Time 8   Period Weeks   Status New   OT LONG TERM GOAL #2   Title Patient will improve ROM of the shoulder on the left side by 10 degrees to be able to reach and operate the washing machine buttons.   Baseline unable at eval   Time 8   Period Weeks   Status New   OT LONG TERM GOAL #3   Title Patient will improve grip strength in the left  hand to be able to open jars and containers with modified independence.   Baseline unable to open containers.   Time 8   Period Weeks   Status New   OT LONG TERM GOAL #4   Title Patient will perform light homemaking tasks with modified independence.   Baseline increased difficulty   Time 8   Period Weeks   Status New   OT LONG TERM GOAL #5   Title Patient  will improve BUE ROM to brush her hair independently.   Baseline requires assist   Time 8   Period Weeks   Status New   Long Term Additional Goals   Additional Long Term Goals Yes   OT LONG TERM GOAL #6   Title Patient will improve strength and endurance to complete 20 minute shopping trip in the grocery store.    Baseline unable to tolerate walking through grocery store for shopping.   Time 8   Period Weeks   Status New   OT LONG TERM GOAL #7   Title Patient will be able to complete laundry tasks with occasional minimal assist.   Baseline unable at eval   Time 8   Period Weeks   Status New               Plan - 07/10/15 1604    Clinical Impression Statement Patient is a 39 yo female who was diagnosed with Guillain Barre syndrome on 04/23/15, she was previously independent with all self care and IADL tasks including driving and working full time in an assembly line at the [employer].  She was hospitalized, discharged home with home health for a short time and now is here for outpatient therapy.  She presents with muscle weakness throughout but on the left side more than right, decreased coordination skills, pain the the left shoulder and upper arm and decreased ability to perform self care tasks, homemaking skills, and unable to work at this time.  She would benefit from skilled OT to maximize her safety and independence in daily tasks.   Pt will benefit from skilled therapeutic intervention in order to improve on the following deficits (Retired) Decreased coordination;Decreased range of motion;Decreased endurance;Decreased  activity tolerance;Decreased balance;Impaired UE functional use;Pain;Decreased strength   Rehab Potential Good   OT Frequency 2x / week   OT Duration 8 weeks   OT Treatment/Interventions Self-care/ADL training;Therapeutic exercise;Patient/family education;Neuromuscular education;Manual Therapy;Balance training;DME and/or AE instruction   Consulted and Agree with Plan of Care Patient        Problem List Patient Active Problem List   Diagnosis Date Noted  . Bell's palsy 06/05/2015  . Left shoulder tendonitis 06/05/2015  . HTN (hypertension) 06/05/2015  . Smoker   . 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

## 2015-07-12 ENCOUNTER — Ambulatory Visit: Payer: Managed Care, Other (non HMO) | Admitting: Occupational Therapy

## 2015-07-12 DIAGNOSIS — G610 Guillain-Barre syndrome: Secondary | ICD-10-CM

## 2015-07-12 DIAGNOSIS — M6281 Muscle weakness (generalized): Secondary | ICD-10-CM

## 2015-07-12 DIAGNOSIS — R460 Very low level of personal hygiene: Secondary | ICD-10-CM

## 2015-07-12 DIAGNOSIS — R279 Unspecified lack of coordination: Secondary | ICD-10-CM

## 2015-07-12 DIAGNOSIS — Z658 Other specified problems related to psychosocial circumstances: Secondary | ICD-10-CM

## 2015-07-12 DIAGNOSIS — Z741 Need for assistance with personal care: Secondary | ICD-10-CM

## 2015-07-12 DIAGNOSIS — Z789 Other specified health status: Secondary | ICD-10-CM

## 2015-07-13 NOTE — Therapy (Signed)
[facility] MAIN [facility] SERVICES [address] [place], [place], [address] Phone: [phone]   Fax:  [phone]  Occupational Therapy Treatment  Patient Details  Name: Alexandria Murray MRN: [id number] Date of Birth: [birth year] Referring Provider:  Morene Crocker, MD  Encounter Date: 07/12/2015      OT End of Session - 07/12/15 1531    Visit Number 2   Number of Visits 16   Date for OT Re-Evaluation 09/06/15   OT Start Time 1100   OT Stop Time 1147   OT Time Calculation (min) 47 min   Activity Tolerance Patient tolerated treatment well   Behavior During Therapy [facility] for tasks assessed/performed      Past Medical History  Diagnosis Date  . Stroke     Past Surgical History  Procedure Laterality Date  . Cholecystectomy    . Finger surgery      There were no vitals filed for this visit.  Visit Diagnosis:  Guillain Barr syndrome  Muscle weakness (generalized)  Lack of coordination  Self-care deficit for dressing and grooming  Decreased independence with light homemaking      Subjective Assessment - 07/12/15 1529    Subjective  Patient reports she only slept about 30 minutes last night, almost forgot about coming to therapy today and is really tired.    Patient Stated Goals Patient wants to be able to do things for herself, be independent and go back to work.   Currently in Pain? Yes   Pain Score 2    Pain Location Arm   Pain Orientation Left   Pain Descriptors / Indicators Aching   Multiple Pain Sites No                      OT Treatments/Exercises (OP) - 07/13/15 0001    Bed Mobility   Bed Mobility Rolling Right                OT Education - 07/12/15 1531    Education provided Yes   Education Details HEP   Person(s) Educated Patient   Methods Explanation;Demonstration;Verbal cues   Comprehension Verbal cues required;Returned demonstration;Verbalized understanding             OT  Long Term Goals - 07/11/15 1646    OT LONG TERM GOAL #1   Title Patient will improve strength in LUE by 1 mm grade to lift milk from refrigerator to table.   Baseline unable    Time 8   Period Weeks   Status New   OT LONG TERM GOAL #2   Title Patient will improve ROM of the shoulder on the left side by 10 degrees to be able to reach and operate the washing machine buttons.   Baseline unable at eval   Time 8   Period Weeks   Status New   OT LONG TERM GOAL #3   Title Patient will improve grip strength in the left hand to be able to open jars and containers with modified independence.   Baseline unable to open containers.   Time 8   Period Weeks   Status New   OT LONG TERM GOAL #4   Title Patient will perform light homemaking tasks with modified independence.   Baseline increased difficulty   Time 8   Period Weeks   Status New   OT LONG TERM GOAL #5   Title Patient will improve BUE ROM to brush her hair independently.  Baseline requires assist   Time 8   Period Weeks   Status New   Long Term Additional Goals   Additional Long Term Goals Yes   OT LONG TERM GOAL #6   Title Patient will improve strength and endurance to complete 20 minute shopping trip in the grocery store.    Baseline unable to tolerate walking through grocery store for shopping.   Time 8   Period Weeks   Status New   OT LONG TERM GOAL #7   Title Patient will be able to complete laundry tasks with occasional minimal assist.   Baseline unable at eval   Time 8   Period Weeks   Status New               Plan - 07/12/15 1531    Clinical Impression Statement Patient demonstrates decreased strength and ROM all over but especially with left UE.  She had difficulty with being able to combine reach and pinch skills in sitting to complete resistive pinch task.  Decreased pain in the left arm and shoulder after exercises in supine this date.  Will continue to focus on strength, ROM and coordination to improve  independence in self care and homemaking tasks.    Pt will benefit from skilled therapeutic intervention in order to improve on the following deficits (Retired) Decreased coordination;Decreased range of motion;Decreased endurance;Decreased activity tolerance;Decreased balance;Impaired UE functional use;Pain;Decreased strength   Rehab Potential Good   OT Frequency 2x / week   OT Duration 8 weeks   OT Treatment/Interventions Self-care/ADL training;Therapeutic exercise;Patient/family education;Neuromuscular education;Manual Therapy;Balance training;DME and/or AE instruction   Consulted and Agree with Plan of Care Patient        Problem List Patient Active Problem List   Diagnosis Date Noted  . Bell's palsy 06/05/2015  . Left shoulder tendonitis 06/05/2015  . HTN (hypertension) 06/05/2015  . Smoker   . 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

## 2015-07-16 ENCOUNTER — Encounter: Payer: Self-pay | Admitting: Occupational Therapy

## 2015-07-16 ENCOUNTER — Ambulatory Visit: Payer: Managed Care, Other (non HMO) | Admitting: Occupational Therapy

## 2015-07-16 DIAGNOSIS — Z658 Other specified problems related to psychosocial circumstances: Secondary | ICD-10-CM

## 2015-07-16 DIAGNOSIS — Z741 Need for assistance with personal care: Secondary | ICD-10-CM

## 2015-07-16 DIAGNOSIS — G610 Guillain-Barre syndrome: Secondary | ICD-10-CM

## 2015-07-16 DIAGNOSIS — R460 Very low level of personal hygiene: Secondary | ICD-10-CM

## 2015-07-16 DIAGNOSIS — R279 Unspecified lack of coordination: Secondary | ICD-10-CM

## 2015-07-16 DIAGNOSIS — Z789 Other specified health status: Secondary | ICD-10-CM

## 2015-07-16 DIAGNOSIS — M6281 Muscle weakness (generalized): Secondary | ICD-10-CM

## 2015-07-16 NOTE — Therapy (Signed)
[facility] MAIN [facility] SERVICES [address] [place], [place], [address] Phone: [phone]   Fax:  [phone]  Occupational Therapy Treatment  Patient Details  Name: Alexandria Murray MRN: [id number] Date of Birth: 1976-06-29 Referring Provider:  Morene Crocker, MD  Encounter Date: 07/16/2015      OT End of Session - 07/16/15 1553    Visit Number 3   Number of Visits 16   Date for OT Re-Evaluation 09/06/15   OT Start Time 0930   OT Stop Time 1026   OT Time Calculation (min) 56 min   Activity Tolerance Patient tolerated treatment well   Behavior During Therapy [facility] for tasks assessed/performed      Past Medical History  Diagnosis Date  . Stroke     Past Surgical History  Procedure Laterality Date  . Cholecystectomy    . Finger surgery      There were no vitals filed for this visit.  Visit Diagnosis:  Guillain Barr syndrome  Muscle weakness (generalized)  Lack of coordination  Self-care deficit for dressing and grooming  Decreased independence with light homemaking      Subjective Assessment - 07/16/15 1551    Subjective  Patient reports the rain was heavy coming over to therapy, she almost turned around and went back home.  "I wanted to come though because I really want to get back to work again.  I am hoping they will let me come back soon on light duty."   Patient Stated Goals Patient wants to be able to do things for herself, be independent and go back to work.   Currently in Pain? No/denies   Pain Score 0-No pain                      OT Treatments/Exercises (OP) - 07/16/15 1559    Fine Motor Coordination   Other Fine Motor Exercises Patient seen for coordination skills this date for manipulation of coins from tabletop with cues for translatory movements of the coins from fingertips to palm and back and placing coins in resistive bank using left hand.   Neurological Re-education Exercises   Other  Exercises 1 Patient seen for BUE ROM and strengthening tasks in supine for shoulder stabilization exercises with place and hold of left arm in 90 degrees of shoulder flexion, added external perturbations from therapist, shoulder protraction/retraction. AROM for shoulder flexion/ext, ABD, ADD. 2.5# weight added to dowel for shoulder flexion/extension, ABd/ADD, forwards circles, backwards circles, and chest press for 10 reps for 1 sets each.    Other Exercises 2 Patient seen for exercises in sitting for AAROM with dowel for shoulder flexion and chest press for 10 reps for 1 set each.  Finger ladder in standing for 5 reps up and down with cues.  Wall slides with bilateral UEs with hands in neutral with pinkie side on wall, 10 reps for 2 sets followed by slide reach to the top range and then lifting hand slightly off wall and repeat for 10 reps.                  OT Education - 07/16/15 1552    Education provided Yes   Education Details exercises for left shoulder for ROM and strengthening   Person(s) Educated Patient   Methods Explanation;Demonstration;Verbal cues   Comprehension Verbal cues required;Returned demonstration;Verbalized understanding             OT Long Term Goals - 07/11/15 1646  OT LONG TERM GOAL #1   Title Patient will improve strength in LUE by 1 mm grade to lift milk from refrigerator to table.   Baseline unable    Time 8   Period Weeks   Status New   OT LONG TERM GOAL #2   Title Patient will improve ROM of the shoulder on the left side by 10 degrees to be able to reach and operate the washing machine buttons.   Baseline unable at eval   Time 8   Period Weeks   Status New   OT LONG TERM GOAL #3   Title Patient will improve grip strength in the left hand to be able to open jars and containers with modified independence.   Baseline unable to open containers.   Time 8   Period Weeks   Status New   OT LONG TERM GOAL #4   Title Patient will perform light  homemaking tasks with modified independence.   Baseline increased difficulty   Time 8   Period Weeks   Status New   OT LONG TERM GOAL #5   Title Patient will improve BUE ROM to brush her hair independently.   Baseline requires assist   Time 8   Period Weeks   Status New   Long Term Additional Goals   Additional Long Term Goals Yes   OT LONG TERM GOAL #6   Title Patient will improve strength and endurance to complete 20 minute shopping trip in the grocery store.    Baseline unable to tolerate walking through grocery store for shopping.   Time 8   Period Weeks   Status New   OT LONG TERM GOAL #7   Title Patient will be able to complete laundry tasks with occasional minimal assist.   Baseline unable at eval   Time 8   Period Weeks   Status New               Plan - 07/16/15 1553    Clinical Impression Statement Patient reports she had pain prior to coming to therapy, about 5/10 but after starting exercises the pain was gone in the left shoulder.  She has full ROM for shoulder flexion in supine, however in sitting she is limited with shoulder flexion.  She is able to demo about 120 degrees with effort for 1-3 reps but then fatigues and is unable  to reproduce movement.  Focused on strengthening in supine followed by AAROM in sitting and standing this date.  Patient continues to require cues both verbal and tactile for form and technique.     Pt will benefit from skilled therapeutic intervention in order to improve on the following deficits (Retired) Decreased coordination;Decreased range of motion;Decreased endurance;Decreased activity tolerance;Decreased balance;Impaired UE functional use;Pain;Decreased strength   Rehab Potential Good   OT Frequency 2x / week   OT Duration 8 weeks   OT Treatment/Interventions Self-care/ADL training;Therapeutic exercise;Patient/family education;Neuromuscular education;Manual Therapy;Balance training;DME and/or AE instruction   Consulted and Agree  with Plan of Care Patient        Problem List Patient Active Problem List   Diagnosis Date Noted  . Bell's palsy 06/05/2015  . Left shoulder tendonitis 06/05/2015  . HTN (hypertension) 06/05/2015  . Smoker   . 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 [place], [place], [address] Phone: [phone]   Fax:  [phone]

## 2015-07-18 ENCOUNTER — Ambulatory Visit: Payer: Managed Care, Other (non HMO) | Admitting: Occupational Therapy

## 2015-07-18 DIAGNOSIS — Z741 Need for assistance with personal care: Secondary | ICD-10-CM

## 2015-07-18 DIAGNOSIS — Z658 Other specified problems related to psychosocial circumstances: Secondary | ICD-10-CM

## 2015-07-18 DIAGNOSIS — R279 Unspecified lack of coordination: Secondary | ICD-10-CM

## 2015-07-18 DIAGNOSIS — M6281 Muscle weakness (generalized): Secondary | ICD-10-CM

## 2015-07-18 DIAGNOSIS — R460 Very low level of personal hygiene: Secondary | ICD-10-CM

## 2015-07-18 DIAGNOSIS — Z789 Other specified health status: Secondary | ICD-10-CM

## 2015-07-18 DIAGNOSIS — G610 Guillain-Barre syndrome: Secondary | ICD-10-CM

## 2015-07-19 NOTE — Therapy (Signed)
[place] [facility] MAIN [facility] SERVICES [address] [place], [place], [address] Phone: [phone]   Fax:  [phone]  Occupational Therapy Treatment  Patient Details  Name: Alexandria Murray MRN: [id number] Date of Birth: 1976/04/06 Referring Kedric Bumgarner:  Morene Crocker, MD  Encounter Date: 07/18/2015      OT End of Session - 07/18/15 1659    Visit Number 4   Number of Visits 16   Date for OT Re-Evaluation 09/06/15   OT Start Time 0916   OT Stop Time 1005   OT Time Calculation (min) 49 min   Activity Tolerance Patient tolerated treatment well   Behavior During Therapy [facility] for tasks assessed/performed      Past Medical History  Diagnosis Date  . Stroke     Past Surgical History  Procedure Laterality Date  . Cholecystectomy    . Finger surgery      There were no vitals filed for this visit.  Visit Diagnosis:  Guillain Barr syndrome  Muscle weakness (generalized)  Lack of coordination  Self-care deficit for dressing and grooming  Decreased independence with light homemaking      Subjective Assessment - 07/18/15 1659    Subjective  Patient reports she has been trying to pack items at home with a possible move soon.  She reports she sits on the floor to place items in a box but still struggles to lift her left arm to place items in, her right arm is previously affected by a stroke.    Patient Stated Goals Patient wants to be able to do things for herself, be independent and go back to work.   Currently in Pain? No/denies   Pain Score 0-No pain                      OT Treatments/Exercises (OP) - 07/19/15 0001    Fine Motor Coordination   Other Fine Motor Exercises Patient seen for coordination skills this date for manipulation of [employer] work system with cues for translatory movements of the small pieces from fingertips to palm and back using left hand.   Neurological Re-education Exercises   Other Exercises 1  Patient seen for BUE ROM and strengthening tasks in supine for shoulder stabilization exercises with place and hold of left arm in 90 degrees of shoulder flexion, added external perturbations from therapist, shoulder protraction/retraction. AROM for shoulder flexion/ext, ABD, ADD.   Other Exercises 2 Added 2# weight for shoulder protraction in supine bilaterally, 1# in left for shoulder flexion and to perform shoulder stabilization exercises, 2# in right hand for all exercises this date. Added ABC exercise for 2 sets with 1 and 2# weights in supine.                 OT Education - 07/18/15 1659    Education provided Yes   Education Details HEP, reaching and functional use of left arm   Person(s) Educated Patient   Methods Explanation;Demonstration;Verbal cues   Comprehension Returned demonstration;Verbalized understanding;Verbal cues required             OT Long Term Goals - 07/11/15 1646    OT LONG TERM GOAL #1   Title Patient will improve strength in LUE by 1 mm grade to lift milk from refrigerator to table.   Baseline unable    Time 8   Period Weeks   Status New   OT LONG TERM GOAL #2   Title Patient will improve ROM  of the shoulder on the left side by 10 degrees to be able to reach and operate the washing machine buttons.   Baseline unable at eval   Time 8   Period Weeks   Status New   OT LONG TERM GOAL #3   Title Patient will improve grip strength in the left hand to be able to open jars and containers with modified independence.   Baseline unable to open containers.   Time 8   Period Weeks   Status New   OT LONG TERM GOAL #4   Title Patient will perform light homemaking tasks with modified independence.   Baseline increased difficulty   Time 8   Period Weeks   Status New   OT LONG TERM GOAL #5   Title Patient will improve BUE ROM to brush her hair independently.   Baseline requires assist   Time 8   Period Weeks   Status New   Long Term Additional Goals    Additional Long Term Goals Yes   OT LONG TERM GOAL #6   Title Patient will improve strength and endurance to complete 20 minute shopping trip in the grocery store.    Baseline unable to tolerate walking through grocery store for shopping.   Time 8   Period Weeks   Status New   OT LONG TERM GOAL #7   Title Patient will be able to complete laundry tasks with occasional minimal assist.   Baseline unable at eval   Time 8   Period Weeks   Status New               Plan - 07/18/15 1700    Clinical Impression Statement Patient able to progress to utilizing a 1# weight on the left, 2# weight on the right for all exercises in supine, was able to use 2# in left for shoulder protraction exercises this date.  Still fatigues quickly with left arm stregnthening.  Continues to demo difficulty with active reach with left arm in sitting, able to perform about 3 reps before being unable to perform another. Verbal and tactile cues for exercises this date.    Pt will benefit from skilled therapeutic intervention in order to improve on the following deficits (Retired) Decreased coordination;Decreased range of motion;Decreased endurance;Decreased activity tolerance;Decreased balance;Impaired UE functional use;Pain;Decreased strength   Rehab Potential Good   OT Frequency 2x / week   OT Duration 8 weeks   OT Treatment/Interventions Self-care/ADL training;Therapeutic exercise;Patient/family education;Neuromuscular education;Manual Therapy;Balance training;DME and/or AE instruction   Consulted and Agree with Plan of Care Patient        Problem List Patient Active Problem List   Diagnosis Date Noted  . Bell's palsy 06/05/2015  . Left shoulder tendonitis 06/05/2015  . HTN (hypertension) 06/05/2015  . Smoker   . 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  [place], [place], [address] Phone: [phone]   Fax:  [phone]

## 2015-07-23 ENCOUNTER — Ambulatory Visit: Payer: Managed Care, Other (non HMO) | Admitting: Occupational Therapy

## 2015-07-23 ENCOUNTER — Encounter: Payer: Self-pay | Admitting: Occupational Therapy

## 2015-07-23 DIAGNOSIS — Z741 Need for assistance with personal care: Secondary | ICD-10-CM

## 2015-07-23 DIAGNOSIS — M6281 Muscle weakness (generalized): Secondary | ICD-10-CM

## 2015-07-23 DIAGNOSIS — G610 Guillain-Barre syndrome: Secondary | ICD-10-CM | POA: Diagnosis not present

## 2015-07-23 DIAGNOSIS — Z658 Other specified problems related to psychosocial circumstances: Secondary | ICD-10-CM

## 2015-07-23 DIAGNOSIS — R460 Very low level of personal hygiene: Secondary | ICD-10-CM

## 2015-07-23 DIAGNOSIS — R279 Unspecified lack of coordination: Secondary | ICD-10-CM

## 2015-07-23 DIAGNOSIS — Z789 Other specified health status: Secondary | ICD-10-CM

## 2015-07-23 NOTE — Therapy (Signed)
[place] [facility] MAIN [facility] SERVICES [address] [place], [place], [address] Phone: [phone]   Fax:  [phone]  Occupational Therapy Treatment  Patient Details  Name: Alexandria Murray MRN: [id number] Date of Birth: [birth year] Referring Provider:  Morene Crocker, MD  Encounter Date: 07/23/2015      OT End of Session - 07/23/15 1250    Visit Number 5   Number of Visits 16   Date for OT Re-Evaluation 09/06/15   OT Start Time 0915   OT Stop Time 1002   OT Time Calculation (min) 47 min   Activity Tolerance Patient tolerated treatment well   Behavior During Therapy [facility] for tasks assessed/performed      Past Medical History  Diagnosis Date  . Stroke     Past Surgical History  Procedure Laterality Date  . Cholecystectomy    . Finger surgery      There were no vitals filed for this visit.  Visit Diagnosis:  Guillain Barr syndrome  Muscle weakness (generalized)  Lack of coordination  Self-care deficit for dressing and grooming  Decreased independence with light homemaking      Subjective Assessment - 07/23/15 0932    Subjective  Patient reports she is now able to walk without the cane, able to go up and down  the back steps, which is about 10 steps.  She reports it is easier to get in and out of the car and driving.  Was able to reach in the cabinet with both hands and get the plates out.     Patient Stated Goals Patient wants to be able to do things for herself, be independent and go back to work.   Currently in Pain? No/denies   Pain Score 0-No pain                      OT Treatments/Exercises (OP) - 07/23/15 1252    Fine Motor Coordination   Other Fine Motor Exercises Patient seen for coordination skills this date for manipulation of small objects (toothpicks) from tabletop with cues using left hand.  Difficulty with picking up from table and had to slide object to edge of table.  Knotting and unknotting  exercise with nylon rope with use of left hand as the lead for the task, cues for technique.     Neurological Re-education Exercises   Other Exercises 1 Patient seen for resistive reciprocal arm exercise in sitting with resistance of 2.0 to 2.4, multidirectional with cues from therapist. Patient performing shoulder flexion with 3# dowel for 10 reps, 2# for 10 reps and  1/2 # for 5 reps.  Chest press with 1/2 # for 2 sets of 10, unable to perform at heavier weight.  ABD with no weight, dowel only for 10 reps for 1 set.  Diagonal patterns both sides for 10 reps each with no added weight.  Grip strength on left side for 25 reps with 11#, then 10 reps with 17# sustained grip with cues.                OT Education - 07/23/15 1249    Education provided Yes   Education Details HEP, stabilization exercises, techniques for obtaining items from cabinet.   Person(s) Educated Patient   Methods Explanation;Demonstration;Verbal cues   Comprehension Verbal cues required;Returned demonstration;Verbalized understanding             OT Long Term Goals - 07/11/15 1646    OT LONG TERM  GOAL #1   Title Patient will improve strength in LUE by 1 mm grade to lift milk from refrigerator to table.   Baseline unable    Time 8   Period Weeks   Status New   OT LONG TERM GOAL #2   Title Patient will improve ROM of the shoulder on the left side by 10 degrees to be able to reach and operate the washing machine buttons.   Baseline unable at eval   Time 8   Period Weeks   Status New   OT LONG TERM GOAL #3   Title Patient will improve grip strength in the left hand to be able to open jars and containers with modified independence.   Baseline unable to open containers.   Time 8   Period Weeks   Status New   OT LONG TERM GOAL #4   Title Patient will perform light homemaking tasks with modified independence.   Baseline increased difficulty   Time 8   Period Weeks   Status New   OT LONG TERM GOAL #5    Title Patient will improve BUE ROM to brush her hair independently.   Baseline requires assist   Time 8   Period Weeks   Status New   Long Term Additional Goals   Additional Long Term Goals Yes   OT LONG TERM GOAL #6   Title Patient will improve strength and endurance to complete 20 minute shopping trip in the grocery store.    Baseline unable to tolerate walking through grocery store for shopping.   Time 8   Period Weeks   Status New   OT LONG TERM GOAL #7   Title Patient will be able to complete laundry tasks with occasional minimal assist.   Baseline unable at eval   Time 8   Period Weeks   Status New               Plan - 07/23/15 1250    Clinical Impression Statement Patient is progressing with overall strength but has been able to perform more daily tasks with her left dominant hand now.  She still demonstrates issues with left shoulder stability and strength and fatigues quickly with exercises and requires modifications.  Any activity requiring reach at shoulder height and above is difficullt to perform with repetitions.   Pt will benefit from skilled therapeutic intervention in order to improve on the following deficits (Retired) Decreased coordination;Decreased range of motion;Decreased endurance;Decreased activity tolerance;Decreased balance;Impaired UE functional use;Pain;Decreased strength   Rehab Potential Good   OT Frequency 2x / week   OT Duration 8 weeks   OT Treatment/Interventions Self-care/ADL training;Therapeutic exercise;Patient/family education;Neuromuscular education;Manual Therapy;Balance training;DME and/or AE instruction   Consulted and Agree with Plan of Care Patient        Problem List Patient Active Problem List   Diagnosis Date Noted  . Bell's palsy 06/05/2015  . Left shoulder tendonitis 06/05/2015  . HTN (hypertension) 06/05/2015  . Smoker   . 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 [place], [place], [address] Phone: [phone]   Fax:  [phone]

## 2015-07-25 ENCOUNTER — Ambulatory Visit: Payer: Managed Care, Other (non HMO) | Admitting: Occupational Therapy

## 2015-07-25 DIAGNOSIS — R460 Very low level of personal hygiene: Secondary | ICD-10-CM

## 2015-07-25 DIAGNOSIS — Z741 Need for assistance with personal care: Secondary | ICD-10-CM

## 2015-07-25 DIAGNOSIS — R279 Unspecified lack of coordination: Secondary | ICD-10-CM

## 2015-07-25 DIAGNOSIS — Z789 Other specified health status: Secondary | ICD-10-CM

## 2015-07-25 DIAGNOSIS — G610 Guillain-Barre syndrome: Secondary | ICD-10-CM | POA: Diagnosis not present

## 2015-07-25 DIAGNOSIS — Z658 Other specified problems related to psychosocial circumstances: Secondary | ICD-10-CM

## 2015-07-25 DIAGNOSIS — M6281 Muscle weakness (generalized): Secondary | ICD-10-CM

## 2015-07-26 DIAGNOSIS — N870 Mild cervical dysplasia: Secondary | ICD-10-CM

## 2015-07-26 HISTORY — PX: COLPOSCOPY: SHX161

## 2015-07-26 HISTORY — DX: Mild cervical dysplasia: N87.0

## 2015-07-26 NOTE — Therapy (Signed)
[place] MAIN [facility] SERVICES [address] [place], [place], [address] Phone: [phone]   Fax:  [phone]  Occupational Therapy Treatment  Patient Details  Name: Alexandria Murray MRN: [id number] Date of Birth: 15-Oct-1976 Referring Provider:  Anabel Bene, MD  Encounter Date: 07/25/2015      OT End of Session - 07/26/15 1637    Visit Number 6   Number of Visits 16   Date for OT Re-Evaluation 09/06/15   OT Start Time 0915   OT Stop Time 1000   OT Time Calculation (min) 45 min   Activity Tolerance Patient tolerated treatment well   Behavior During Therapy [facility] for tasks assessed/performed      Past Medical History  Diagnosis Date  . Stroke     Past Surgical History  Procedure Laterality Date  . Cholecystectomy    . Finger surgery      There were no vitals filed for this visit.  Visit Diagnosis:  Guillain Barr syndrome  Muscle weakness (generalized)  Lack of coordination  Self-care deficit for dressing and grooming  Decreased independence with light homemaking      Subjective Assessment - 07/25/15 0925    Subjective  Patient reports she continues to feel better each day, has been busy this week with house details, trying to buy a house. Able to carry items in her hands going down the steps.     Patient Stated Goals Patient wants to be able to do things for herself, be independent and go back to work.   Currently in Pain? No/denies   Pain Score 0-No pain                      OT Treatments/Exercises (OP) - 07/25/15 2025    Fine Motor Coordination   Other Fine Motor Exercises Patient seen for manipulation of coins from tabletop, using translatory movements of the hand, moving coins from fingertips to palm and back to place into resistive bank.  Cues provided for technique.   Neurological Re-education Exercises   Other Exercises 1 UBE in sitting, 2 minutes forwards, 2 minutes back, forwards/backwards,  alternating levels of resistance of 2.0 to 2.4 with cues, therapist in constant attendance to provide cues and ensure grip.   16 oz hammer for supination, pronation for 2 sets of 20 reps each.  Resistive finger extension to place items in resistive slots.    Other Exercises 2 finger ladder in standing for shoulder flexion for 5 complete trials, ABD 5 trials.                 OT Education - 07/26/15 1636    Education provided Yes   Education Details shoulder exercises, ROM, HEP   Person(s) Educated Patient   Methods Explanation;Demonstration;Verbal cues   Comprehension Verbal cues required;Returned demonstration;Verbalized understanding             OT Long Term Goals - 07/25/15 1644    OT LONG TERM GOAL #1   Title Patient will improve strength in LUE by 1 mm grade to lift milk from refrigerator to table.   Time 8   Period Weeks   Status On-going   OT LONG TERM GOAL #2   Title Patient will improve ROM of the shoulder on the left side by 10 degrees to be able to reach and operate the washing machine buttons.   Time 8   Period Weeks   Status Partially Met   OT LONG  TERM GOAL #3   Title Patient will improve grip strength in the left hand to be able to open jars and containers with modified independence.   Time 8   Period Weeks   Status On-going   OT LONG TERM GOAL #4   Title Patient will perform light homemaking tasks with modified independence.   Time 8   Period Weeks   Status Partially Met   OT LONG TERM GOAL #5   Title Patient will improve BUE ROM to brush her hair independently.   Time 8   Period Weeks   Status Partially Met   OT LONG TERM GOAL #6   Title Patient will improve strength and endurance to complete 20 minute shopping trip in the grocery store.    Time 8   Period Weeks   Status Partially Met   OT LONG TERM GOAL #7   Title Patient will be able to complete laundry tasks with occasional minimal assist.   Time 8   Period Weeks   Status Partially Met                Plan - 07/25/15 1637    Clinical Impression Statement Patient continues to progress in all areas each week.  She remains limited in her left shoulder which is her dominant extremity with repeated reaching tasks.  She can perform 3-5 reps then fatigues and compensates with using shoulder ABD, elbow out and in a flexed position and then is able to try to extend arm up.  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  She speaks of having to use a 4# hammer to hit oil pans but she cannot perform this task yet.  She was able to hold and use a 1# hammer for supination/pronation for limited repetitions with the left UE.     Pt will benefit from skilled therapeutic intervention in order to improve on the following deficits (Retired) Decreased coordination;Decreased range of motion;Decreased endurance;Decreased activity tolerance;Decreased balance;Impaired UE functional use;Pain;Decreased strength   Rehab Potential Good   OT Frequency 2x / week   OT Duration 8 weeks   OT Treatment/Interventions Self-care/ADL training;Therapeutic exercise;Patient/family education;Neuromuscular education;Manual Therapy;Balance training;DME and/or AE instruction   Consulted and Agree with Plan of Care Patient        Problem List Patient Active Problem List   Diagnosis Date Noted  . Bell's palsy 06/05/2015  . Left shoulder tendonitis 06/05/2015  . HTN (hypertension) 06/05/2015  . Smoker   . 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

## 2015-07-30 ENCOUNTER — Ambulatory Visit: Payer: Managed Care, Other (non HMO) | Attending: Neurology | Admitting: Occupational Therapy

## 2015-07-30 DIAGNOSIS — G610 Guillain-Barre syndrome: Secondary | ICD-10-CM

## 2015-07-30 DIAGNOSIS — M6281 Muscle weakness (generalized): Secondary | ICD-10-CM

## 2015-07-30 DIAGNOSIS — R279 Unspecified lack of coordination: Secondary | ICD-10-CM

## 2015-07-30 DIAGNOSIS — R460 Very low level of personal hygiene: Secondary | ICD-10-CM | POA: Diagnosis present

## 2015-07-30 DIAGNOSIS — Z741 Need for assistance with personal care: Secondary | ICD-10-CM

## 2015-07-30 DIAGNOSIS — Z658 Other specified problems related to psychosocial circumstances: Secondary | ICD-10-CM | POA: Diagnosis present

## 2015-07-30 DIAGNOSIS — Z789 Other specified health status: Secondary | ICD-10-CM

## 2015-07-30 NOTE — Therapy (Signed)
[place] MAIN [facility] SERVICES [address] [place], [place], [address] Phone: [phone]   Fax:  [phone]  Occupational Therapy Treatment  Patient Details  Name: Alexandria Murray MRN: [id number] Date of Birth: [birth year] Referring Provider:  Anabel Bene, MD  Encounter Date: 07/30/2015      OT End of Session - 07/30/15 1526    Visit Number 7   Number of Visits 16   Date for OT Re-Evaluation 09/06/15   OT Start Time 0915   OT Stop Time 1002   OT Time Calculation (min) 47 min   Activity Tolerance Patient tolerated treatment well   Behavior During Therapy [facility] for tasks assessed/performed      Past Medical History  Diagnosis Date  . Stroke     Past Surgical History  Procedure Laterality Date  . Cholecystectomy    . Finger surgery      There were no vitals filed for this visit.  Visit Diagnosis:  Guillain Barr syndrome [facility])  Muscle weakness (generalized)  Lack of coordination  Self-care deficit for dressing and grooming  Decreased independence with light homemaking      Subjective Assessment - 07/30/15 1504    Subjective  Patient reports she was able to shave her legs and under her arms for the first time since being hospitalized.    Patient Stated Goals Patient wants to be able to do things for herself, be independent and go back to work.   Currently in Pain? No/denies   Pain Score 0-No pain                      OT Treatments/Exercises (OP) - 07/30/15 1506    Fine Motor Coordination   Other Fine Motor Exercises Reassessed fine motor coordination with 9 hole peg test as follows:  Right 20 secs, left 22 secs.   Neurological Re-education Exercises   Other Exercises 1 Reassessed patient this date for grip strength, pinch and ROM of the BUEs.  Right grip 42# (up from 19#), left grip 42# (up from 30#).  Right lateral pinch 12# (up from 9#), left 12# (up from 10#), 3 point pinch right 9# (up from 7#),  left 14# (up from 9#).  ROM of Right shoulder flexion 150 degrees, Left 152 degrees (up from 125 degrees).     Other Exercises 2 UBE this date with resistance of 2.3 to 2.5 for 5 minutes total, forwards/backwards with cues.  Weighted ball toss in standing for 2 sets of 10 reps with 2000g ball.  Overhead weighted ball press 2000g for 2 sets of 5 reps, chest press 2 sets of 5 reps.  Triceps press 12# for 8 reps, 7.5# for 10 reps.  Bilateral biceps curl 7.5# for 5 reps, single arm biceps 2.5# for 2 sets of 5 reps each arm.  Finger ladder in standing for 5 reps for shoulder flexion.                OT Education - 07/30/15 1525    Education provided Yes   Education Details HEP, strengthening, functional use for tasks around the house.   Person(s) Educated Patient   Methods Explanation;Demonstration;Verbal cues   Comprehension Verbal cues required;Returned demonstration;Verbalized understanding             OT Long Term Goals - 07/25/15 1644    OT LONG TERM GOAL #1   Title Patient will improve strength in LUE by 1 mm grade to lift milk  from refrigerator to table.   Time 8   Period Weeks   Status On-going   OT LONG TERM GOAL #2   Title Patient will improve ROM of the shoulder on the left side by 10 degrees to be able to reach and operate the washing machine buttons.   Time 8   Period Weeks   Status Partially Met   OT LONG TERM GOAL #3   Title Patient will improve grip strength in the left hand to be able to open jars and containers with modified independence.   Time 8   Period Weeks   Status On-going   OT LONG TERM GOAL #4   Title Patient will perform light homemaking tasks with modified independence.   Time 8   Period Weeks   Status Partially Met   OT LONG TERM GOAL #5   Title Patient will improve BUE ROM to brush her hair independently.   Time 8   Period Weeks   Status Partially Met   OT LONG TERM GOAL #6   Title Patient will improve strength and endurance to complete  20 minute shopping trip in the grocery store.    Time 8   Period Weeks   Status Partially Met   OT LONG TERM GOAL #7   Title Patient will be able to complete laundry tasks with occasional minimal assist.   Time 8   Period Weeks   Status Partially Met               Plan - 07/30/15 1526    Clinical Impression Statement Patient has made excellent progress since starting therapy.  Her grip strength, pinch, ROM, overall strength and coordination has improved extensively in the last few weeks.  She is now able to perform more tasks around the house and is becoming independent with all her basic self care tasks.  She feels she could likely go back to work but would have to have limitations and modifications.  She continues to benefit from skilled OT 2x a week towards her goals.    Pt will benefit from skilled therapeutic intervention in order to improve on the following deficits (Retired) Decreased coordination;Decreased range of motion;Decreased endurance;Decreased activity tolerance;Decreased balance;Impaired UE functional use;Pain;Decreased strength   Rehab Potential Good   OT Frequency 2x / week   OT Duration 8 weeks   OT Treatment/Interventions Self-care/ADL training;Therapeutic exercise;Patient/family education;Neuromuscular education;Manual Therapy;Balance training;DME and/or AE instruction   Consulted and Agree with Plan of Care Patient        Problem List Patient Active Problem List   Diagnosis Date Noted  . Bell's palsy 06/05/2015  . Left shoulder tendonitis 06/05/2015  . HTN (hypertension) 06/05/2015  . Smoker   . Sharlyn Bologna syndrome [facility] [place]) 05/28/2015   Billijo T Tomasita Morrow, OTR/L, CLT Johnnetta Holstine,[place] 07/30/2015, 3:59 PM  [place] MAIN [facility] SERVICES [address] [place], [place], [address] Phone: [phone]   Fax:  [phone]

## 2015-08-01 ENCOUNTER — Ambulatory Visit: Payer: Managed Care, Other (non HMO) | Admitting: Occupational Therapy

## 2015-08-01 DIAGNOSIS — G610 Guillain-Barre syndrome: Secondary | ICD-10-CM | POA: Diagnosis not present

## 2015-08-01 DIAGNOSIS — Z658 Other specified problems related to psychosocial circumstances: Secondary | ICD-10-CM

## 2015-08-01 DIAGNOSIS — R279 Unspecified lack of coordination: Secondary | ICD-10-CM

## 2015-08-01 DIAGNOSIS — Z789 Other specified health status: Secondary | ICD-10-CM

## 2015-08-01 DIAGNOSIS — M6281 Muscle weakness (generalized): Secondary | ICD-10-CM

## 2015-08-01 DIAGNOSIS — Z741 Need for assistance with personal care: Secondary | ICD-10-CM

## 2015-08-01 DIAGNOSIS — R460 Very low level of personal hygiene: Secondary | ICD-10-CM

## 2015-08-02 NOTE — Therapy (Signed)
[place] MAIN [facility] SERVICES [address] [place], [place], [address] Phone: [phone]   Fax:  [phone]  Occupational Therapy Treatment  Patient Details  Name: Alexandria Murray MRN: [id number] Date of Birth: 04-03-1976 Referring Provider:  Anabel Bene, MD  Encounter Date: 08/01/2015      OT End of Session - 08/01/15 1528    Visit Number 8   Number of Visits 16   Date for OT Re-Evaluation 09/06/15   OT Start Time 0915   OT Stop Time 1004   OT Time Calculation (min) 49 min   Activity Tolerance Patient tolerated treatment well   Behavior During Therapy [facility] for tasks assessed/performed      Past Medical History  Diagnosis Date  . Stroke     Past Surgical History  Procedure Laterality Date  . Cholecystectomy    . Finger surgery      There were no vitals filed for this visit.  Visit Diagnosis:  Guillain Barr syndrome [facility])  Muscle weakness (generalized)  Lack of coordination  Self-care deficit for dressing and grooming  Decreased independence with light homemaking      Subjective Assessment - 08/01/15 1526    Subjective  Patient reports she went to see the neurologist yesterday and reports she can return to work on Sunday with no restrictions.  She realizes her left arm still has deficits with ROM and strength but feels her work can accommodate her and switch her to another area if needed.    Patient Stated Goals Patient wants to be able to do things for herself, be independent and go back to work.   Currently in Pain? No/denies   Pain Score 0-No pain   Multiple Pain Sites No                      OT Treatments/Exercises (OP) - 08/01/15 5400    Fine Motor Coordination   Other Fine Motor Exercises Patient seen for coordination exercises of knotting, manipulation of small washers on long dowels with multi directional reach, [place] like discs for flipping/manipulation,.   Neurological Re-education  Exercises   Other Exercises 1 UBE this date with resistance of 3.0 to 3.3 for 6 minutes total, forwards/backwards with cues. Triceps press 12# for 8 reps, 7.5# for 10 reps. Bilateral biceps curl 7.5# for 5 reps, single arm biceps 2.5# for 2 sets of 5 reps each arm. Finger ladder in standing for 5 reps for shoulder flexion.  Attempted chest press with cable pulleys and patient was unable to complete this date with increased fatigue after other exercises. Resistive pinch pins placing onto elevated surface with cues, right UE all levels of pinch including black, left UE levels to blue.                  OT Education - 08/01/15 1528    Education provided Yes   Education Details HEP, use of arm at home and work   [employer]) Educated Patient   Methods Explanation;Demonstration;Verbal cues   Comprehension Verbal cues required;Returned demonstration;Verbalized understanding             OT Long Term Goals - 08/01/15 1531    OT LONG TERM GOAL #1   Title Patient will improve strength in LUE by 1 mm grade to lift milk from refrigerator to table.   Time 8   Period Weeks   Status On-going   OT LONG TERM GOAL #2   Title Patient  will improve ROM of the shoulder on the left side by 10 degrees to be able to reach and operate the washing machine buttons.   Time 8   Period Weeks   Status Achieved   OT LONG TERM GOAL #3   Title Patient will improve grip strength in the left hand to be able to open jars and containers with modified independence.   Time 8   Period Weeks   Status On-going   OT LONG TERM GOAL #4   Title Patient will perform light homemaking tasks with modified independence.   Time 8   Period Weeks   Status Achieved   OT LONG TERM GOAL #5   Title Patient will improve BUE ROM to brush her hair independently.   Time 8   Period Weeks   Status Achieved   OT LONG TERM GOAL #6   Title Patient will improve strength and endurance to complete 20 minute shopping trip in the grocery  store.    Time 8   Period Weeks   Status Achieved   OT LONG TERM GOAL #7   Title Patient will be able to complete laundry tasks with occasional minimal assist.   Time 8   Period Weeks   Status Partially Met               Plan - 08/01/15 1529    Clinical Impression Statement Patient's neurologist cleared her to return to work on Sunday.  She is planning to keep her appointment next week to follow up with therapist to see if there are any work related tasks we would need to work on as well as continued ROM and strength of  BUE but especially her left dominant.  She has made excellent progress as outlined in the last session and updated goals.    Pt will benefit from skilled therapeutic intervention in order to improve on the following deficits (Retired) Decreased coordination;Decreased range of motion;Decreased endurance;Decreased activity tolerance;Decreased balance;Impaired UE functional use;Pain;Decreased strength   Rehab Potential Good   OT Frequency 2x / week   OT Duration 8 weeks   OT Treatment/Interventions Self-care/ADL training;Therapeutic exercise;Patient/family education;Neuromuscular education;Manual Therapy;Balance training;DME and/or AE instruction   Consulted and Agree with Plan of Care Patient        Problem List Patient Active Problem List   Diagnosis Date Noted  . Bell's palsy 06/05/2015  . Left shoulder tendonitis 06/05/2015  . HTN (hypertension) 06/05/2015  . Smoker   . Sharlyn Bologna syndrome [facility]) 05/28/2015   Aalaya T Tomasita Morrow, OTR/L, CLT  Lovett,Alexandria Murray 08/02/2015, 8:49 AM  [place] MAIN [facility] SERVICES [address] [place], [place], [address] Phone: [phone]   Fax:  [phone]

## 2015-08-06 ENCOUNTER — Ambulatory Visit: Payer: Managed Care, Other (non HMO) | Admitting: Occupational Therapy

## 2015-08-06 DIAGNOSIS — M6281 Muscle weakness (generalized): Secondary | ICD-10-CM

## 2015-08-06 DIAGNOSIS — G610 Guillain-Barre syndrome: Secondary | ICD-10-CM

## 2015-08-06 DIAGNOSIS — Z789 Other specified health status: Secondary | ICD-10-CM

## 2015-08-06 DIAGNOSIS — Z658 Other specified problems related to psychosocial circumstances: Secondary | ICD-10-CM

## 2015-08-06 DIAGNOSIS — Z741 Need for assistance with personal care: Secondary | ICD-10-CM

## 2015-08-06 DIAGNOSIS — R279 Unspecified lack of coordination: Secondary | ICD-10-CM

## 2015-08-06 DIAGNOSIS — R460 Very low level of personal hygiene: Secondary | ICD-10-CM

## 2015-08-06 NOTE — Therapy (Signed)
[place] MAIN [facility] SERVICES [address] [place], [place], [address] Phone: [phone]   Fax:  [phone]  Occupational Therapy Treatment  Patient Details  Name: Alexandria Murray MRN: [id number] Date of Birth: [birth year] Referring Provider:  Anabel Bene, MD  Encounter Date: 08/06/2015      OT End of Session - 08/06/15 1549    Visit Number 9   Number of Visits 16   Date for OT Re-Evaluation 09/06/15   OT Start Time 0918   OT Stop Time 1003   OT Time Calculation (min) 45 min   Activity Tolerance Patient tolerated treatment well   Behavior During Therapy [facility] for tasks assessed/performed      Past Medical History  Diagnosis Date  . Stroke     Past Surgical History  Procedure Laterality Date  . Cholecystectomy    . Finger surgery      There were no vitals filed for this visit.  Visit Diagnosis:  Guillain Barr syndrome [facility])  Muscle weakness (generalized)  Lack of coordination  Self-care deficit for dressing and grooming  Decreased independence with light homemaking      Subjective Assessment - 08/06/15 1547    Subjective  Patient reports she returned to work last night for 3rd shift.  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   Currently in Pain? No/denies   Pain Score 0-No pain                      OT Treatments/Exercises (OP) - 08/06/15 1557    ADLs   ADL Comments Updated goals, patient has met all her self care and homemaking goals and is independent in these areas.  She still has some fatigue at times and has been able to modify activities to accommodate.   Fine Motor Coordination   Other  Fine Motor Exercises Patient seen for manipulation of nuts and bolts of various sizes simulating tasks she was required to perform last night.  Manipulation of small dowel sticks to pick up from table and place from a horizontal surface to a vertical position using oppositional movements of the left hand.    Neurological Re-education Exercises   Other Exercises 1 Patient seen for strengthening tasks with 1# dowel for shoulder flexion, chest press, forwards/backwards circles, ABD/ADD for 15 reps for 1-2 sets each.  UBE for 6 minutes, forwards/backwards, alternating levels of resistance of 3.0 to 3.3 and able to keep grip intact this date.  Grip strength for 25 reps for 17# followed by 25 reps at 11# of pressure for sustained grip.                  OT Education - 08/06/15 1549    Education provided Yes   Education Details HEP, work related tasks   Person(s) Educated Patient   Methods Demonstration;Explanation;Verbal cues   Comprehension Verbal cues required;Returned demonstration;Verbalized understanding             OT Long Term Goals - 08/06/15 1555    OT LONG TERM GOAL #1   Title Patient will improve strength in LUE  by 1 mm grade to lift milk from refrigerator to table.   Time 8   Period Weeks   Status Achieved   OT LONG TERM GOAL #2   Title Patient will improve ROM of the shoulder on the left side by 10 degrees to be able to reach and operate the washing machine buttons.   Time 8   Period Weeks   Status Achieved   OT LONG TERM GOAL #3   Title Patient will improve grip strength in the left hand to be able to open jars and containers with modified independence.   Time 8   Period Weeks   Status Achieved   OT LONG TERM GOAL #4   Title Patient will perform light homemaking tasks with modified independence.   Time 8   Period Weeks   Status Achieved   OT LONG TERM GOAL #5   Title Patient will improve BUE ROM to brush her hair independently.   Time 8   Period Weeks    Status Achieved   OT LONG TERM GOAL #6   Title Patient will improve strength and endurance to complete 20 minute shopping trip in the grocery store.    Time 8   Period Weeks   Status Achieved   OT LONG TERM GOAL #7   Title Patient will be able to complete laundry tasks with occasional minimal assist.   Time 8   Period Weeks   Status Achieved               Plan - 08/06/15 1550    Clinical Impression Statement Patient returned to work last evening for 3rd shift.  She was able to perform the tasks for the evening which was not her "normal" tasks but in the same area as she worked in the past.  She reported fatigue but was able to perform the tasks without difficulty.  She feels it may be too much to come for therapy after working all night and wants to put her therapy appts on hold for 1-2 weeks and see how she does with work tasks.  She may need modifications or exercises upgraded prior to discharge.  She met all of her initial goals related to self care and home tasks, will leave patient chart open briefly in case she needs to return in the next 2 weeks and upgrade goals to include any work tasks if necessary.     Pt will benefit from skilled therapeutic intervention in order to improve on the following deficits (Retired) Decreased coordination;Decreased range of motion;Decreased endurance;Decreased activity tolerance;Decreased balance;Impaired UE functional use;Pain;Decreased strength   Rehab Potential Good   OT Frequency 2x / week   OT Duration 8 weeks   OT Treatment/Interventions Self-care/ADL training;Therapeutic exercise;Patient/family education;Neuromuscular education;Manual Therapy;Balance training;DME and/or AE instruction   Consulted and Agree with Plan of Care Patient        Problem List Patient Active Problem List   Diagnosis Date Noted  . Bell's palsy 06/05/2015  . Left shoulder tendonitis 06/05/2015  . HTN (hypertension) 06/05/2015  . Smoker   . Sharlyn Bologna  syndrome ([place]) 05/28/2015   Charma T Tomasita Morrow, OTR/L, CLT Lovett,Darlyne 08/06/2015, 4:03 PM   [facility] MAIN [facility] SERVICES [address] [place], [place], [address] Phone: [phone]   Fax:  [phone]

## 2015-08-08 ENCOUNTER — Ambulatory Visit: Payer: Managed Care, Other (non HMO) | Admitting: Occupational Therapy

## 2015-08-13 ENCOUNTER — Ambulatory Visit: Payer: Managed Care, Other (non HMO) | Admitting: Occupational Therapy

## 2015-08-15 ENCOUNTER — Ambulatory Visit: Payer: Managed Care, Other (non HMO) | Admitting: Occupational Therapy

## 2015-08-21 ENCOUNTER — Ambulatory Visit: Payer: Managed Care, Other (non HMO) | Admitting: Occupational Therapy

## 2015-08-23 ENCOUNTER — Encounter: Payer: Managed Care, Other (non HMO) | Admitting: Occupational Therapy

## 2016-09-30 DIAGNOSIS — R29898 Other symptoms and signs involving the musculoskeletal system: Secondary | ICD-10-CM | POA: Insufficient documentation

## 2016-09-30 DIAGNOSIS — Z8669 Personal history of other diseases of the nervous system and sense organs: Secondary | ICD-10-CM | POA: Insufficient documentation

## 2016-12-01 DIAGNOSIS — G5602 Carpal tunnel syndrome, left upper limb: Secondary | ICD-10-CM | POA: Insufficient documentation

## 2016-12-10 ENCOUNTER — Other Ambulatory Visit: Payer: Self-pay | Admitting: Neurology

## 2016-12-10 DIAGNOSIS — R29898 Other symptoms and signs involving the musculoskeletal system: Secondary | ICD-10-CM

## 2016-12-24 ENCOUNTER — Ambulatory Visit
Admission: RE | Admit: 2016-12-24 | Discharge: 2016-12-24 | Disposition: A | Discharge status: Home / Self Care | Payer: Commercial Managed Care - PPO | Source: Ambulatory Visit | Attending: Neurology | Admitting: Neurology

## 2016-12-24 ENCOUNTER — Encounter (INDEPENDENT_AMBULATORY_CARE_PROVIDER_SITE_OTHER): Payer: Self-pay

## 2016-12-24 DIAGNOSIS — G9389 Other specified disorders of brain: Secondary | ICD-10-CM | POA: Diagnosis not present

## 2016-12-24 DIAGNOSIS — M48061 Spinal stenosis, lumbar region without neurogenic claudication: Secondary | ICD-10-CM | POA: Diagnosis not present

## 2016-12-24 DIAGNOSIS — R29898 Other symptoms and signs involving the musculoskeletal system: Secondary | ICD-10-CM | POA: Diagnosis not present

## 2016-12-24 DIAGNOSIS — M4802 Spinal stenosis, cervical region: Secondary | ICD-10-CM | POA: Diagnosis not present

## 2016-12-24 DIAGNOSIS — M8938 Hypertrophy of bone, other site: Secondary | ICD-10-CM | POA: Diagnosis not present

## 2016-12-24 DIAGNOSIS — M5126 Other intervertebral disc displacement, lumbar region: Secondary | ICD-10-CM | POA: Insufficient documentation

## 2016-12-24 DIAGNOSIS — M50222 Other cervical disc displacement at C5-C6 level: Secondary | ICD-10-CM | POA: Insufficient documentation

## 2016-12-24 MED ORDER — GADOBENATE DIMEGLUMINE 529 MG/ML IV SOLN
13.0000 mL | Freq: Once | INTRAVENOUS | Status: AC | PRN
  Administered 2016-12-24: 13 mL via INTRAVENOUS

## 2017-02-13 ENCOUNTER — Encounter: Payer: Self-pay | Admitting: Obstetrics and Gynecology

## 2017-02-13 ENCOUNTER — Ambulatory Visit (INDEPENDENT_AMBULATORY_CARE_PROVIDER_SITE_OTHER): Payer: Commercial Managed Care - PPO | Admitting: Obstetrics and Gynecology

## 2017-02-13 VITALS — BP 128/86 | Ht 61.0 in | Wt 146.0 lb

## 2017-02-13 DIAGNOSIS — R87810 Cervical high risk human papillomavirus (HPV) DNA test positive: Secondary | ICD-10-CM

## 2017-02-13 DIAGNOSIS — N83202 Unspecified ovarian cyst, left side: Secondary | ICD-10-CM

## 2017-02-13 DIAGNOSIS — R87612 Low grade squamous intraepithelial lesion on cytologic smear of cervix (LGSIL): Secondary | ICD-10-CM

## 2017-02-13 NOTE — Progress Notes (Signed)
Obstetrics & Gynecology Office Visit   Chief Complaint  Patient presents with  . Ovarian Cyst  Referral Appointment  History of Present Illness: 41 y.o. non-pregnant female who presents in referral from [facility], Dr. Beverely Risen, MD, and Vincent Gros, NP.   She presents for evaluation of a loft ovarian cyst that is called "likely complex" on the ultrasound report dated 01/18/17.  The ultrasound was otherwise unremarkable (no free fluid).  Her uterus is a normal size without other abnormal findings.  The endometrial stripe is reported to be 4mm.  The right ovary is reported as measuring a normal size.  There is no further description of the cyst on the left ovary.  The cyst was discovered incidentally on a scan for another purpose.  The cyst measured 2.1 x 0.9 x 1.4 cm.  She denies any symptoms and reports no pain.  She is still menstruating, though her menses are spacing out. Her last menses was three months ago.  She reports hot flushes, difficulty sleeping, but states that she is excited about being possible going into menopause.  Her gynecologic medical history is significant for an LGSIL pap smear 2 years ago, which she has never follow up on.  She did have a colposcopy the biopsies of which showed CIN 1.   Review of Systems  Constitutional:       +night sweats  HENT: Negative.   Eyes: Negative.   Respiratory: Negative.   Cardiovascular: Negative.   Gastrointestinal: Negative.   Genitourinary: Negative.   Musculoskeletal: Negative.   Skin: Negative.   Neurological: Negative.   Psychiatric/Behavioral: Negative.     Past Medical History:  Diagnosis Date  . Abnormal Pap smear of cervix 06/20/2015  . Anxiety   . 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 Guillain-Barre syndrome (HCC)   . Nicotine dependence   . Stroke (HCC)   . Torn rotator cuff     Past Surgical History:    Procedure Laterality Date  . CHOLECYSTECTOMY    . COLPOSCOPY  07/26/2015  . FINGER SURGERY      Gynecologic History: No LMP recorded. Patient is not currently having periods (Reason: Perimenopausal).  Family History: notes + history of cancer. Denies history of GYN cancer in her family.  Social History   Social History  . Marital status: Married    Spouse name: N/A  . Number of children: N/A  . Years of education: N/A   Occupational History  . Not on file.   Social History Main Topics  . Smoking status: Current Every Day Smoker    Packs/day: 2.00    Types: Cigarettes  . Smokeless tobacco: Never Used  . Alcohol use No  . Drug use: No  . Sexual activity: Yes    Birth control/ protection: Post-menopausal   Other Topics Concern  . Not on file   Social History Narrative  . No narrative on file    Allergies  Allergen Reactions  . Percocet [Oxycodone-Acetaminophen] Nausea And Vomiting    Medications:   Medication Sig Start Date End Date Taking? Authorizing Provider  acetaminophen (TYLENOL) 500 MG tablet Take 1,000 mg by mouth every 6 (six) hours as needed for mild pain.   Yes Historical Provider, MD  Aspirin-Acetaminophen-Caffeine (PAMPRIN MAX PO) Take 2 tablets by mouth daily.   Yes Historical Provider, MD  docusate sodium (COLACE) 100 MG capsule Take 1 capsule (100  mg total) by mouth 2 (two) times daily. 06/05/15  Yes Katharina Caper, MD  escitalopram (LEXAPRO) 10 MG tablet Take 10 mg by mouth daily.   Yes Historical Provider, MD  ferrous sulfate 325 (65 FE) MG tablet Take 325 mg by mouth daily.   Yes Historical Provider, MD  lisinopril (ZESTRIL) 10 MG tablet Take 0.5 tablets (5 mg total) by mouth daily. 06/05/15  Yes Katharina Caper, MD  methylPREDNISolone (MEDROL DOSEPAK) 4 MG TBPK tablet follow package directions 06/05/15  Yes Katharina Caper, MD  Multiple Vitamin (MULTIVITAMIN WITH MINERALS) TABS tablet Take 1 tablet by mouth daily. 06/05/15  Yes Katharina Caper, MD  Multiple  Vitamins-Minerals (MULTIVITAMIN PO) Take 1 tablet by mouth daily.   Yes Historical Provider, MD  nicotine (NICOTROL) 10 MG inhaler Inhale 1 cartridge (1 continuous puffing total) into the lungs as needed for smoking cessation. 06/05/15  Yes Katharina Caper, MD  ondansetron (ZOFRAN ODT) 4 MG disintegrating tablet Take 1 tablet (4 mg total) by mouth every 8 (eight) hours as needed for nausea or vomiting. 05/24/15  Yes Gayla Doss, MD  oxyCODONE 10 MG TABS Take 1 tablet (10 mg total) by mouth every 6 (six) hours as needed for severe pain. 06/05/15  Yes Katharina Caper, MD  oxyCODONE-acetaminophen (PERCOCET/ROXICET) 5-325 MG per tablet Take 1 tablet by mouth every 6 (six) hours as needed for severe pain. 05/22/15  Yes Darci Current, MD    Physical Exam BP 128/86   Ht  (1.549 m)   Wt 146 lb (66.2 kg)   BMI 27.59 kg/m   No LMP recorded. Patient is not currently having periods (Reason: Perimenopausal).  Physical Exam  Constitutional: She is oriented to person, place, and time and well-developed, well-nourished, and in no distress. No distress.  HENT:  Head: Normocephalic and atraumatic.  Eyes: Conjunctivae are normal. No scleral icterus.  Neck: Normal range of motion. Neck supple. No thyromegaly present.  Cardiovascular: Normal rate, regular rhythm and normal heart sounds.   Pulmonary/Chest: Effort normal and breath sounds normal. No respiratory distress. She has no wheezes. She has no rales.  Abdominal: Soft. Bowel sounds are normal. She exhibits no distension and no mass. There is no tenderness. There is no rebound and no guarding.  Genitourinary: Vagina normal, uterus normal, cervix normal, right adnexa normal and left adnexa normal. No vaginal discharge found.  Musculoskeletal: Normal range of motion. She exhibits no edema.  Lymphadenopathy:    She has no cervical adenopathy.  Neurological: She is oriented to person, place, and time. No cranial nerve deficit.  Skin: Skin is warm and dry. No  pallor.  Psychiatric: Mood, affect and judgment normal.   Female chaperone present for pelvic and breast  portions of the physical exam  Ultrasound report reviewed from 12/2016.  Assessment: 41 y.o. female with incidentally found left ovarian cyst and history of LGSIL pap smear in 2016.   Plan: 1. LGSIL on Pap smear of cervix, Papanicolaou smear of cervix with positive high risk human papilloma virus (HPV) test - IGP, Aptima HPV, rfx 16/18,45  (pap smear with HPV)  3. Left ovarian cyst - Repeat ultrasound in 4-8 weeks. Unable to determine characteristics of cyst given that I only have a report.  Most likely benign given age of patient.  Discussed this with patient and she is agreeable.  - US Transvaginal Non-OB; Future   Return in about 6 weeks (around 03/27/2017) for schedule pelvic u/s and follow up Dr Jean Rosenthal after.   30 minutes  spent in face to face discussion with > 50% spent in counseling and management of her ovarian cyst and her abnormal pap smear.    Thomasene Mohair, MD 02/15/2017 1:52 PM    CC: [facility] Vickii Penna, MD Vincent Gros, FNP [address] [place], [place] [address]

## 2017-02-18 LAB — IGP, APTIMA HPV, RFX 16/18,45
HPV APTIMA: POSITIVE — AB
PAP SMEAR COMMENT: 0

## 2017-02-23 ENCOUNTER — Encounter: Payer: Self-pay | Admitting: Obstetrics and Gynecology

## 2017-02-23 ENCOUNTER — Telehealth: Payer: Self-pay | Admitting: Obstetrics and Gynecology

## 2017-02-23 NOTE — Telephone Encounter (Signed)
Spoke with patient. Discussed abnormal results. She knows to schedule a colposcopy and why this is important. Discussed that without close follow up she runs the risk of developing cancer of the cervix. She voiced understanding of this and will schedule colposcopy.

## 2017-03-30 ENCOUNTER — Ambulatory Visit: Payer: Commercial Managed Care - PPO | Admitting: Obstetrics and Gynecology

## 2017-03-30 ENCOUNTER — Other Ambulatory Visit: Payer: Commercial Managed Care - PPO

## 2017-04-20 ENCOUNTER — Encounter: Payer: Self-pay | Admitting: Obstetrics and Gynecology

## 2017-04-20 ENCOUNTER — Ambulatory Visit (INDEPENDENT_AMBULATORY_CARE_PROVIDER_SITE_OTHER): Payer: Commercial Managed Care - PPO | Admitting: Obstetrics and Gynecology

## 2017-04-20 ENCOUNTER — Ambulatory Visit (INDEPENDENT_AMBULATORY_CARE_PROVIDER_SITE_OTHER): Payer: Commercial Managed Care - PPO

## 2017-04-20 VITALS — BP 128/76 | Ht 61.0 in | Wt 154.0 lb

## 2017-04-20 DIAGNOSIS — N83202 Unspecified ovarian cyst, left side: Secondary | ICD-10-CM | POA: Diagnosis not present

## 2017-04-20 NOTE — Progress Notes (Signed)
Gynecology Ultrasound Follow Up   Chief Complaint  Patient presents with  . Follow-up  left ovarian cyst   History of Present Illness: Patient is a 41 y.o. female who presents today for ultrasound evaluation of a left ovarian cyst.   She had a left ovarian cyst incidentally noted in April 2018 that measured 2.1 x 0.9 x 1.4 cm.  It was called complex (no images noted, no further description).    Ultrasound demonstrates the following findings Adnexa: left ovary with a 3.1 x 2.1 x 2.1 cm cyst with what appears to be a dependant fluid-fluid level, no  Doppler flow. Uterus: retroverted with endometrial stripe  2.1 mm Additional: no free fluid   Review of Systems: Review of Systems  Constitutional: Negative.   HENT: Negative.   Eyes: Negative.   Respiratory: Negative.   Cardiovascular: Negative.   Gastrointestinal: Negative.   Genitourinary: Negative.   Musculoskeletal: Negative.   Skin: Negative.   Neurological: Negative.   Psychiatric/Behavioral: Negative.     Past Medical History:  Diagnosis Date  . Abnormal Pap smear of cervix 06/20/2015  . Anxiety   . 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 Guillain-Barre syndrome (HCC)   . Nicotine dependence   . Stroke (HCC)   . Torn rotator cuff     Past Surgical History:  Past Surgical History:  Procedure Laterality Date  . CHOLECYSTECTOMY    . COLPOSCOPY  07/26/2015  . FINGER SURGERY      Family History:  History reviewed. No pertinent family history.  Social History:  Social History   Social History  . Marital status: Married    Spouse name: N/A  . Number of children: N/A  . Years of education: N/A   Occupational History  . Not on file.   Social History Main Topics  . Smoking status: Current Every Day Smoker    Packs/day: 2.00    Types: Cigarettes  . Smokeless tobacco: Never Used  . Alcohol use No  . Drug use: No  . Sexual  activity: Yes    Birth control/ protection: Post-menopausal   Other Topics Concern  . Not on file   Social History Narrative  . No narrative on file    Allergies:  Allergies  Allergen Reactions  . Percocet [Oxycodone-Acetaminophen] Nausea And Vomiting    Medications: Prior to Admission medications   Medication Sig Start Date End Date Taking? Authorizing Provider  acetaminophen (TYLENOL) 500 MG tablet Take 1,000 mg by mouth every 6 (six) hours as needed for mild pain.    [provider]  Aspirin-Acetaminophen-Caffeine (PAMPRIN MAX PO) Take 2 tablets by mouth daily.    [provider]  docusate sodium (COLACE) 100 MG capsule Take 1 capsule (100 mg total) by mouth 2 (two) times daily. 06/05/15   Katharina CaperVaickute, Rima, MD  escitalopram (LEXAPRO) 10 MG tablet Take 10 mg by mouth daily.    [provider]  ferrous sulfate 325 (65 FE) MG tablet Take 325 mg by mouth daily.    [provider]  lisinopril (ZESTRIL) 10 MG tablet Take 0.5 tablets (5 mg total) by mouth daily. 06/05/15   Katharina CaperVaickute, Rima, MD  methylPREDNISolone (MEDROL DOSEPAK) 4 MG TBPK tablet follow package directions 06/05/15   Katharina CaperVaickute, Rima, MD  Multiple Vitamin (MULTIVITAMIN WITH MINERALS) TABS tablet Take 1 tablet by mouth daily. 06/05/15   Katharina CaperVaickute, Rima, MD  Multiple Vitamins-Minerals (MULTIVITAMIN PO) Take  1 tablet by mouth daily.    [provider]  nicotine (NICOTROL) 10 MG inhaler Inhale 1 cartridge (1 continuous puffing total) into the lungs as needed for smoking cessation. 06/05/15   Katharina Caper, MD  ondansetron (ZOFRAN ODT) 4 MG disintegrating tablet Take 1 tablet (4 mg total) by mouth every 8 (eight) hours as needed for nausea or vomiting. 05/24/15   Gayla Doss, MD  oxyCODONE 10 MG TABS Take 1 tablet (10 mg total) by mouth every 6 (six) hours as needed for severe pain. 06/05/15   Katharina Caper, MD  oxyCODONE-acetaminophen (PERCOCET/ROXICET) 5-325 MG per tablet Take 1 tablet by mouth  every 6 (six) hours as needed for severe pain. 05/22/15   Darci Current, MD    Physical Exam Vitals: Blood pressure 128/76, height 5\' 1"  (1.549 m), weight 154 lb (69.9 kg).  General: NAD HEENT: normocephalic, anicteric Pulmonary: No increased work of breathing Extremities: no edema, erythema, or tenderness Neurologic: Grossly intact, normal gait Psychiatric: mood appropriate, affect full   Assessment: 41 y.o. female left ovarian cyst, abnormal pap smear  Plan: Problem List Items Addressed This Visit    Left ovarian cyst - Primary    F/u pelvic u/s in 2-3 months for resolution. Appears benign, mostly likely hemorrhagic.  Abnormal pap smear: schedule colposcopy.  15 minutes spent in face to face discussion with > 50% spent in counseling and management of her left ovarian cyst and abnormal pap smear.     Thomasene Mohair, MD 04/20/2017 10:09 AM

## 2017-05-11 ENCOUNTER — Ambulatory Visit: Payer: Commercial Managed Care - PPO

## 2017-05-12 ENCOUNTER — Ambulatory Visit (INDEPENDENT_AMBULATORY_CARE_PROVIDER_SITE_OTHER): Payer: Commercial Managed Care - PPO | Admitting: Obstetrics and Gynecology

## 2017-05-12 ENCOUNTER — Encounter: Payer: Self-pay | Admitting: Obstetrics and Gynecology

## 2017-05-12 VITALS — BP 128/84 | Ht 61.0 in | Wt 160.0 lb

## 2017-05-12 DIAGNOSIS — R87612 Low grade squamous intraepithelial lesion on cytologic smear of cervix (LGSIL): Secondary | ICD-10-CM

## 2017-05-12 DIAGNOSIS — B977 Papillomavirus as the cause of diseases classified elsewhere: Secondary | ICD-10-CM

## 2017-05-12 DIAGNOSIS — N72 Inflammatory disease of cervix uteri: Secondary | ICD-10-CM

## 2017-05-12 NOTE — Progress Notes (Signed)
HPI:  Alexandria Murray is a 41 y.o.  [id number]  who presents today for evaluation and management of abnormal cervical cytology.    Dysplasia History:  LGSIL pap smear with HPV+  OB History  Gravida Para Term Preterm AB Living  14 4     10 3   SAB TAB Ectopic Multiple Live Births  10       3    # Outcome Date GA Lbr Len/2nd Weight Sex Delivery Anes PTL Lv  14 Para 09/10/16   7 lb (3.175 kg) F Vag-Spont     13 Para 09/28/05   6 lb (2.722 kg) M Vag-Spont   LIV  12 Para 09/03/96   7 lb (3.175 kg) F Vag-Spont   LIV  11 Para 06/20/95   6 lb (2.722 kg) F Vag-Spont   LIV  10 SAB           9 SAB           8 SAB           7 SAB           6 SAB           5 SAB           4 SAB           3 SAB           2 SAB           1 SAB               Past Medical History:  Diagnosis Date  . Abnormal Pap smear of cervix 06/20/2015  . Anxiety   . 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 Guillain-Barre syndrome (HCC)   . Nicotine dependence   . Stroke (HCC)   . Torn rotator cuff     Past Surgical History:  Procedure Laterality Date  . CHOLECYSTECTOMY    . COLPOSCOPY  07/26/2015  . FINGER SURGERY      SOCIAL HISTORY: History  Alcohol Use No   History  Drug Use No     No family history on file.  ALLERGIES:  Percocet [oxycodone-acetaminophen]  Current Outpatient Prescriptions on File Prior to Visit  Medication Sig Dispense Refill  . acetaminophen (TYLENOL) 500 MG tablet Take 1,000 mg by mouth every 6 (six) hours as needed for mild pain.    . Aspirin-Acetaminophen-Caffeine (PAMPRIN MAX PO) Take 2 tablets by mouth daily.    Marland Kitchen docusate sodium (COLACE) 100 MG capsule Take 1 capsule (100 mg total) by mouth 2 (two) times daily. 10 capsule 0  . escitalopram (LEXAPRO) 10 MG tablet Take 10 mg by mouth daily.    . ferrous sulfate 325 (65 FE) MG tablet Take 325 mg by mouth daily.    Marland Kitchen lisinopril (ZESTRIL) 10 MG  tablet Take 0.5 tablets (5 mg total) by mouth daily. 30 tablet 3  . methylPREDNISolone (MEDROL DOSEPAK) 4 MG TBPK tablet follow package directions 21 tablet 0  . Multiple Vitamin (MULTIVITAMIN WITH MINERALS) TABS tablet Take 1 tablet by mouth daily. 30 tablet 6  . Multiple Vitamins-Minerals (MULTIVITAMIN PO) Take 1 tablet by mouth daily.    . nicotine (NICOTROL) 10 MG inhaler Inhale 1 cartridge (1 continuous puffing total) into the lungs as needed for smoking cessation. 42 each 0  . ondansetron (ZOFRAN ODT) 4 MG disintegrating tablet Take 1 tablet (  4 mg total) by mouth every 8 (eight) hours as needed for nausea or vomiting. 12 tablet 0  . oxyCODONE 10 MG TABS Take 1 tablet (10 mg total) by mouth every 6 (six) hours as needed for severe pain. 30 tablet 0  . oxyCODONE-acetaminophen (PERCOCET/ROXICET) 5-325 MG per tablet Take 1 tablet by mouth every 6 (six) hours as needed for severe pain. 20 tablet 0   No current facility-administered medications on file prior to visit.     Physical Exam: -Vitals:  BP 128/84   Ht 5\' 1"  (1.549 m)   Wt 160 lb (72.6 kg)   BMI 30.23 kg/m  GEN: WD, WN, NAD.  A+ O x 3, good mood and affect. ABD:  NT, ND.  Soft, no masses.  No hernias noted.   Pelvic:   Vulva: Normal appearance.  No lesions.  Vagina: No lesions or abnormalities noted.  Support: Normal pelvic support.  Urethra No masses tenderness or scarring.  Meatus Normal size without lesions or prolapse.  Cervix: See below.  Anus: Normal exam.  No lesions.  Perineum: Normal exam.  No lesions.        Bimanual   Uterus: Normal size.  Non-tender.  Mobile.  AV.  Adnexae: No masses.  Non-tender to palpation.  Cul-de-sac: Negative for abnormality.   PROCEDURE: 1.  Urine Pregnancy Test:  not done 2.  Colposcopy performed with 4% acetic acid after verbal consent obtained                                         -Aceto-white Lesions Location(s): diffusely, more more pronounced at biopsy sites               -Biopsy performed at 4, 6, 9, 12 o'clock               -ECC indicated and performed: Yes.       -Biopsy sites made hemostatic with pressure and/or Monsel's solution   -Satisfactory colposcopy: No.    -Evidence of Invasive cervical CA :  NO  ASSESSMENT:  Alexandria Murray is a 41 y.o. [id number] here for No diagnosis found.Marland Kitchen.  PLAN: I discussed the grading system of pap smears and HPV high risk viral types.  We will discuss and base management after colpo results return.       Thomasene MohairStephen Shlomo Seres, MD  [facility], [facility] 05/12/2017  9:02 AM

## 2017-05-15 LAB — PATHOLOGY

## 2017-06-03 DIAGNOSIS — M5412 Radiculopathy, cervical region: Secondary | ICD-10-CM | POA: Insufficient documentation

## 2017-06-22 ENCOUNTER — Encounter: Payer: Self-pay | Admitting: Obstetrics and Gynecology

## 2017-06-22 ENCOUNTER — Telehealth: Payer: Self-pay | Admitting: Obstetrics and Gynecology

## 2017-06-22 NOTE — Telephone Encounter (Signed)
Discussed result of CIN 2. Discussed recommendation for LEEP procedure.  She voiced understanding and agreement. Discussed that if left untreated, could progress to cancer.  We will schedule LEEP procedure in-office for soon.  Letter sent to patient to the same effect.

## 2017-07-13 ENCOUNTER — Ambulatory Visit (INDEPENDENT_AMBULATORY_CARE_PROVIDER_SITE_OTHER): Payer: Commercial Managed Care - PPO | Admitting: Obstetrics and Gynecology

## 2017-07-13 ENCOUNTER — Encounter: Payer: Self-pay | Admitting: Obstetrics and Gynecology

## 2017-07-13 VITALS — BP 128/88 | Ht 61.0 in | Wt 170.0 lb

## 2017-07-13 DIAGNOSIS — N871 Moderate cervical dysplasia: Secondary | ICD-10-CM

## 2017-07-13 DIAGNOSIS — N83202 Unspecified ovarian cyst, left side: Secondary | ICD-10-CM

## 2017-07-13 MED ORDER — HYDROCODONE-ACETAMINOPHEN 5-325 MG PO TABS: 1.0000 | ORAL_TABLET | Freq: Four times a day (QID) | ORAL | 0 refills | Status: DC | PRN

## 2017-07-13 MED ORDER — IBUPROFEN 600 MG PO TABS: 600.0000 mg | ORAL_TABLET | Freq: Four times a day (QID) | ORAL | 0 refills | Status: DC | PRN

## 2017-07-13 NOTE — Patient Instructions (Signed)
Cervical Conization, Care After  This sheet gives you information about how to care for yourself after your procedure. Your health care provider may also give you more specific instructions. If you have problems or questions, contact your health care provider.  What can I expect after the procedure?  After the procedure, it is common to have:   A groggy feeling, if you were given medicine to make you fall asleep (general anesthetic).   Cramps that feel similar to menstrual cramps.   Bloody discharge or light to moderate bleeding.   Dark discharge. This discharge may look similar to coffee grounds. This is from the paste that was applied to the cervix to control bleeding.    Follow these instructions at home:  Medicines   Take over-the-counter and prescription medicines only as told by your health care provider.   Do not take aspirin until your health care provider says it is okay. Aspirin can cause bleeding.   If you are taking pain medicine:   You may need to prevent or treat constipation. To do this, your health care provider may recommend that you:   Drink enough fluid to keep your urine clear or pale yellow.   Take over-the-counter or prescription medicines.   Eat foods that are high in fiber, such as fresh fruits and vegetables, whole grains, bran, and beans.   Limit foods that are high in fat and processed sugars, such as fried and sweet foods.   Do not drive or use heavy machinery.  General instructions   You may resume your normal diet unless your health care provider advises you not to do so.   Take showers for the first week. Do not take baths, swim, or use hot tubs until your health care provider says it is okay.   Do not douche, use tampons, or have sex until your health care provider says it is okay.   Avoid activities that require great effort, such as exercises and heavy lifting, for at least 7-14 days.   Keep all follow-up visits as told by your health care provider. This is  important.  Contact a health care provider if:   You develop a rash.   You are dizzy or lightheaded.   You feel nauseous or you vomit.   You develop a bad smelling discharge from your vagina.  Get help right away if:   You have blood clots or bleeding that is heavier than a normal period. Bleeding that soaks a pad in less than 1 hour is considered heavy bleeding.   You have a fever.   You have increasing cramps.   You faint.   You have pain when you urinate.   You have severe or worsening pain.   Your pain is not relieved when you take medicine.   You have bloody urine.   You vomit.  Summary   After the procedure, it is common to have cramps and dark or bloody discharge from your vagina.   Do not douche, use tampons, or have sex until your health care provider says it is okay.   Follow all other activity restrictions as told by your health care provider.  This information is not intended to replace advice given to you by your health care provider. Make sure you discuss any questions you have with your health care provider.  Document Released: 10/13/2005 Document Revised: 10/15/2016 Document Reviewed: 10/15/2016  Elsevier Interactive Patient Education  2017 [employer]

## 2017-07-13 NOTE — Progress Notes (Signed)
  LEEP PROCEDURE NOTE  The LEEP has been explained to the patient in detail; risks/benefits reviewed.  The risks include, but are not limited to, bleeding, infection, and the possibility of cervical stenosis or cervical incompetence.  The patient had previously been given information regarding abnormal PAP smears and their relationship to HPV.  We have discussed the natural course and history of HPV, the possibility of incomplete treatment by the LEEP, as well as the possibility of recurrence.  I have reviewed the consent form for LEEP with her, and she fully understands its contents.  We have discussed the procedure itself. I have informed her that following the LEEP she should refrain from intercourse and the use of tampons for three weeks, and that she should also expect some spotting and brown/black discharge over the next several days.  We have discussed the fact that vaginal bleeding, differentiated from spotting, is not normal and that if she should have this complication, she should contact me immediately.  The follow-up after LEEP will be PAP smears or viral typing performed at regular intervals for up to 3-5 years.  Should these all prove to be normal, she will then be back on typical cervical screening.  I have answered all of her questions, and I believe she has an adequate understanding of the LEEP, its implications, and the necessity of follow-up care.  I discussed her colpo results and explained the procedure of LEEP.  All questions were answered and she signed the consent form.    LEEP performed in the usual manner after reviewing the previous colpo findings and results. Lugol's solution was usesd to identify any abnormal areas of the cervix.  The cervix was cleansed with betadine solution. Local injection of Lidocaine with epinephrine was performed for anesthesia. Ectocervical and then endocervical specimens obtained using the loop electrodes without difficulty.  It was labeled  accordingly.  Endocervical curettage performed after.  The base and edges of the defect was then cauterized using coagulation current.  The patient tolerated the procedure well.  Hemostasis obtained at end of procedure.  Thomasene Mohair, MD 07/13/2017 9:36 AM

## 2017-07-16 LAB — PATHOLOGY

## 2017-07-22 ENCOUNTER — Telehealth: Payer: Self-pay | Admitting: Obstetrics and Gynecology

## 2017-07-22 ENCOUNTER — Ambulatory Visit: Payer: Commercial Managed Care - PPO | Admitting: Obstetrics and Gynecology

## 2017-07-22 ENCOUNTER — Other Ambulatory Visit: Payer: Commercial Managed Care - PPO

## 2017-07-22 NOTE — Telephone Encounter (Signed)
Relayed LEEP results. All questions answered.

## 2017-08-12 ENCOUNTER — Ambulatory Visit (INDEPENDENT_AMBULATORY_CARE_PROVIDER_SITE_OTHER): Payer: Commercial Managed Care - PPO

## 2017-08-12 ENCOUNTER — Ambulatory Visit (INDEPENDENT_AMBULATORY_CARE_PROVIDER_SITE_OTHER): Payer: Commercial Managed Care - PPO | Admitting: Obstetrics and Gynecology

## 2017-08-12 VITALS — BP 128/84 | Wt 168.0 lb

## 2017-08-12 DIAGNOSIS — N83202 Unspecified ovarian cyst, left side: Secondary | ICD-10-CM | POA: Diagnosis not present

## 2017-08-12 DIAGNOSIS — N871 Moderate cervical dysplasia: Secondary | ICD-10-CM

## 2017-08-12 NOTE — Progress Notes (Signed)
   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  Subjective: Patient reports marked improvement in her preop symptoms. Eating a regular diet without difficulty. The patient is not having any pain.  Activity: normal activities of daily living.  She also is seen in follow up for a left ovarian cyst.  She had a pelvic ultrasound today that showed only small follicles and no persistent cyst.   Objective: Vitals:   08/12/17 0844  BP: 128/84   Vital Signs: BP 128/84   Wt 168 lb (76.2 kg)   BMI 31.74 kg/m  Constitutional: Well nourished, well developed female in no acute distress.  HEENT: normal Skin: Warm and dry.  Extremity: no edema  Abdomen: Soft, non-tender, normal bowel sounds; no bruits, organomegaly or masses.   Assessment: 41 y.o. s/p LEEP progressing well, resolved left ovarian cyst.   Plan: Patient has done well after surgery with no apparent complications.  I have discussed the post-operative course to date, and the expected progress moving forward.  The patient understands what complications to be concerned about.  I will see the patient in routine follow up, or sooner if needed.    Activity plan: No restriction.  Patient needs follow up pap smear in one year with obligate HPV testing.   Thomasene MohairStephen Ercie Eliasen, MD 08/12/2017, 9:40 AM   CC: [facility] Vickii PennaFazia Khan, MD Vincent GrosHeather Boscia, FNP [address] [place], [place] [id number]

## 2017-10-13 ENCOUNTER — Other Ambulatory Visit: Payer: Self-pay

## 2017-10-13 MED ORDER — ONDANSETRON 4 MG PO TBDP: 4.0000 mg | ORAL_TABLET | Freq: Three times a day (TID) | ORAL | 0 refills | Status: DC | PRN

## 2017-10-15 ENCOUNTER — Other Ambulatory Visit: Payer: Self-pay | Admitting: Nurse Practitioner

## 2017-10-15 MED ORDER — ONDANSETRON 4 MG PO TBDP: 4.0000 mg | ORAL_TABLET | Freq: Three times a day (TID) | ORAL | 0 refills | Status: DC | PRN

## 2017-11-11 DIAGNOSIS — D509 Iron deficiency anemia, unspecified: Secondary | ICD-10-CM | POA: Insufficient documentation

## 2017-11-11 DIAGNOSIS — F17210 Nicotine dependence, cigarettes, uncomplicated: Secondary | ICD-10-CM | POA: Insufficient documentation

## 2017-11-11 DIAGNOSIS — F411 Generalized anxiety disorder: Secondary | ICD-10-CM | POA: Insufficient documentation

## 2017-11-11 DIAGNOSIS — M25519 Pain in unspecified shoulder: Secondary | ICD-10-CM | POA: Insufficient documentation

## 2017-11-11 DIAGNOSIS — E782 Mixed hyperlipidemia: Secondary | ICD-10-CM | POA: Insufficient documentation

## 2017-11-12 ENCOUNTER — Encounter: Payer: Self-pay | Admitting: Nurse Practitioner

## 2017-11-12 ENCOUNTER — Ambulatory Visit: Payer: Commercial Managed Care - PPO | Admitting: Nurse Practitioner

## 2017-11-12 VITALS — BP 126/85 | HR 91 | Temp 98.8°F | Resp 16 | Ht 61.0 in | Wt 174.0 lb

## 2017-11-12 DIAGNOSIS — F5101 Primary insomnia: Secondary | ICD-10-CM

## 2017-11-12 DIAGNOSIS — R110 Nausea: Secondary | ICD-10-CM

## 2017-11-12 DIAGNOSIS — M150 Primary generalized (osteo)arthritis: Secondary | ICD-10-CM

## 2017-11-12 DIAGNOSIS — G610 Guillain-Barre syndrome: Secondary | ICD-10-CM

## 2017-11-12 DIAGNOSIS — F321 Major depressive disorder, single episode, moderate: Secondary | ICD-10-CM

## 2017-11-12 DIAGNOSIS — R10812 Left upper quadrant abdominal tenderness: Secondary | ICD-10-CM

## 2017-11-12 MED ORDER — ONDANSETRON 4 MG PO TBDP: 4.0000 mg | ORAL_TABLET | Freq: Three times a day (TID) | ORAL | 2 refills | Status: DC | PRN

## 2017-11-12 MED ORDER — TRAMADOL HCL 50 MG PO TABS: 100.0000 mg | ORAL_TABLET | Freq: Two times a day (BID) | ORAL | 3 refills | Status: DC | PRN

## 2017-11-12 MED ORDER — ESCITALOPRAM OXALATE 10 MG PO TABS: 10.0000 mg | ORAL_TABLET | Freq: Every day | ORAL | 3 refills | Status: DC

## 2017-11-12 MED ORDER — TRAZODONE HCL 100 MG PO TABS: 100.0000 mg | ORAL_TABLET | Freq: Every evening | ORAL | 3 refills | Status: DC | PRN

## 2017-11-12 MED ORDER — MELOXICAM 15 MG PO TABS: 15.0000 mg | ORAL_TABLET | Freq: Every day | ORAL | 3 refills | Status: DC

## 2017-11-12 NOTE — Progress Notes (Signed)
[facility] [address] [place], [place] [address]  Internal MEDICINE  Office Visit Note  Patient Name: Alexandria Murray  [id number]  [id number]  Date of Service: 11/12/2017  No chief complaint on file.   Patient is here for routine follow up visit. She did run a fever last night of 100.2. Had chills and body aches. Does have some chest discomfort in her right lung. Took Nyquil an pampirin and rested all night. She states that today, she is feeling much better. Does still have some discomfort in her chest. States that she is having some "aches and pains." Has more severe pain in her hands. Left arm continues to have pain and weakness since she suffered from [place] ber[place] syndrome. She takes meloxicam to treat pain and inflammation and tramadol as needed for more severe pain.  She is reporting intermittent left upper quadrant abdominal pain. apening every night when she gets off work and gets into her car. Feels like something is twisting in that area. Will last for a few minutes then release. No diarrhea, nausea, or vomiting associated with this. Does not happen as often when she is off. Notes that bending over seems to make this worse .she has had gallbladder removed already.     Pt is here for routine follow up.    Current Medication: Outpatient Encounter Medications as of 11/12/2017  Medication Sig  . acetaminophen (TYLENOL) 500 MG tablet Take 1,000 mg by mouth every 6 (six) hours as needed for mild pain.  . Aspirin-Acetaminophen-Caffeine (PAMPRIN MAX PO) Take 2 tablets by mouth daily.  Marland Kitchen. docusate sodium (COLACE) 100 MG capsule Take 1 capsule (100 mg total) by mouth 2 (two) times daily.  Marland Kitchen. escitalopram (LEXAPRO) 10 MG tablet Take 10 mg by mouth daily.  . ferrous sulfate 325 (65 FE) MG tablet Take 325 mg by mouth daily.  . folic acid (FOLVITE) 0.5 MG tablet Take 0.5 mg by mouth daily.  Marland Kitchen. ibuprofen (ADVIL,MOTRIN) 600 MG tablet Take 1 tablet (600 mg total) by mouth every 6  (six) hours as needed for mild pain or cramping.  . meloxicam (MOBIC) 15 MG tablet Take 15 mg by mouth daily.  . Multiple Vitamin (MULTIVITAMIN WITH MINERALS) TABS tablet Take 1 tablet by mouth daily.  . Multiple Vitamins-Minerals (MULTIVITAMIN PO) Take 1 tablet by mouth daily.  . traMADol (ULTRAM) 50 MG tablet   . traZODone (DESYREL) 100 MG tablet   . HYDROcodone-acetaminophen (NORCO) 5-325 MG tablet Take 1 tablet by mouth every 6 (six) hours as needed for moderate pain. (Patient not taking: Reported on 11/12/2017)  . nicotine (NICOTROL) 10 MG inhaler Inhale 1 cartridge (1 continuous puffing total) into the lungs as needed for smoking cessation. (Patient not taking: Reported on 11/12/2017)  . ondansetron (ZOFRAN ODT) 4 MG disintegrating tablet Take 1 tablet (4 mg total) by mouth every 8 (eight) hours as needed for nausea or vomiting. (Patient not taking: Reported on 11/12/2017)  . traZODone (DESYREL) 50 MG tablet trazodone 50 mg tablet   No facility-administered encounter medications on file as of 11/12/2017.     Surgical History: Past Surgical History:  Procedure Laterality Date  . CHOLECYSTECTOMY    . COLPOSCOPY  07/26/2015  . FINGER SURGERY      Medical History: Past Medical History:  Diagnosis Date  . Abnormal Pap smear of cervix 06/20/2015  . Anxiety   . 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 Dysplasia of cervix, low grade (CIN 1)  07/26/2015  . Guillain-Barre syndrome (HCC)   . Nicotine dependence   . Stroke (HCC)   . Torn rotator cuff     Family History: No family history on file.  Social History   Socioeconomic History  . Marital status: Married    Spouse name: Not on file  . Number of children: Not on file  . Years of education: Not on file  . Highest education level: Not on file  Social Needs  . Financial resource strain: Not on file  . Food insecurity - worry: Not on file  . Food insecurity - inability: Not on file  .  Transportation needs - medical: Not on file  . Transportation needs - non-medical: Not on file  Occupational History  . Not on file  Tobacco Use  . Smoking status: Current Every Day Smoker    Packs/day: 2.00    Types: Cigarettes  . Smokeless tobacco: Never Used  Substance and Sexual Activity  . Alcohol use: No  . Drug use: No  . Sexual activity: Yes    Birth control/protection: Post-menopausal  Other Topics Concern  . Not on file  Social History Narrative  . Not on file      Review of Systems  Constitutional: Positive for chills and fever. Negative for fatigue and unexpected weight change.       Fever of 100.2 yesterday.   HENT: Positive for postnasal drip. Negative for congestion, rhinorrhea, sneezing and sore throat.   Eyes: Negative for redness.  Respiratory: Positive for cough and chest tightness. Negative for shortness of breath.        Right sided chest discomfort.   Cardiovascular: Negative for chest pain and palpitations.  Gastrointestinal: Positive for abdominal distention, abdominal pain, nausea and vomiting. Negative for constipation and diarrhea.  Endocrine: Negative.   Genitourinary: Negative for dysuria and frequency.  Musculoskeletal: Positive for arthralgias, back pain and myalgias. Negative for joint swelling and neck pain.  Skin: Negative.  Negative for rash.  Neurological: Positive for headaches. Negative for tremors and numbness.  Hematological: Negative.  Negative for adenopathy. Does not bruise/bleed easily.  Psychiatric/Behavioral: Positive for sleep disturbance. Negative for behavioral problems (Depression) and suicidal ideas. The patient is not nervous/anxious.     Today's Vitals   11/12/17 0858  BP: 126/85  Pulse: 91  Resp: 16  Temp: 98.8 F (37.1 C)  SpO2: 95%  Weight: 174 lb (78.9 kg)  Height: 5\' 1"  (1.549 m)    Physical Exam  Constitutional: She is oriented to person, place, and time. She appears well-developed and well-nourished.   HENT:  Head: Normocephalic and atraumatic.  Eyes: Pupils are equal, round, and reactive to light.  Neck: Normal range of motion. Neck supple.  Cardiovascular: Normal rate, regular rhythm and normal heart sounds.  Pulmonary/Chest: Effort normal and breath sounds normal.  Abdominal: Soft. Bowel sounds are normal. She exhibits no distension. There is no tenderness. There is no guarding.  Musculoskeletal: Normal range of motion.  Chronic tendernes in left shoulder and arm as well as pain in both hands. ROM and strength are at their baseline in left arm. ROM and strength are intact in both hands.   Lymphadenopathy:    She has no cervical adenopathy.  Neurological: She is alert and oriented to person, place, and time.  Skin: Skin is warm and dry.  Psychiatric: She has a normal mood and affect.    Assessment/Plan: 1. Primary generalized (osteo)arthritis - meloxicam (MOBIC) 15 MG tablet; Take 1 tablet (15 mg  total) by mouth daily.  Dispense: 30 tablet; Refill: 3 - traMADol (ULTRAM) 50 MG tablet; Take 2 tablets (100 mg total) by mouth every 12 (twelve) hours as needed for moderate pain.  Dispense: 120 tablet; Refill: 3  2. Depression, major, single episode, moderate (HCC) - escitalopram (LEXAPRO) 10 MG tablet; Take 1 tablet (10 mg total) by mouth daily.  Dispense: 30 tablet; Refill: 3  3. Guillain Barr syndrome (HCC) Chronic left arm/shoulder pain and weakness. Unchanged. Will continue t monitor.   4. Primary insomnia - traZODone (DESYREL) 100 MG tablet; Take 1 tablet (100 mg total) by mouth at bedtime as needed for sleep.  Dispense: 30 tablet; Refill: 3  5. Nausea - ondansetron (ZOFRAN ODT) 4 MG disintegrating tablet; Take 1 tablet (4 mg total) by mouth every 8 (eight) hours as needed for nausea or vomiting.  Dispense: 90 tablet; Refill: 2  6. LUQ tenderness - currently pain free.  Consider hernia or intestinal spasms. Will monitor closely. Abdominal u/s as indicated   General  Counseling: Raziyah verbalizes understanding of the findings of todays visit and agrees with plan of treatment. I have discussed any further diagnostic evaluation that may be needed or ordered today. We also reviewed her medications today. she has been encouraged to call the office with any questions or concerns that should arise related to todays visit.   This patient was seen by Vincent Gros, FNP- C in Collaboration with Dr Lyndon Code as a part of collaborative care agreement      Time spent: 20 Minutes    Dr Lyndon Code Internal medicine

## 2017-11-16 ENCOUNTER — Other Ambulatory Visit: Payer: Self-pay

## 2017-11-16 DIAGNOSIS — R110 Nausea: Secondary | ICD-10-CM

## 2017-11-16 MED ORDER — ONDANSETRON 4 MG PO TBDP: 4.0000 mg | ORAL_TABLET | Freq: Three times a day (TID) | ORAL | 2 refills | Status: DC | PRN

## 2017-11-26 ENCOUNTER — Other Ambulatory Visit: Payer: Self-pay

## 2017-11-26 DIAGNOSIS — R110 Nausea: Secondary | ICD-10-CM

## 2017-11-26 MED ORDER — ONDANSETRON 4 MG PO TBDP: 4.0000 mg | ORAL_TABLET | Freq: Three times a day (TID) | ORAL | 2 refills | Status: DC | PRN

## 2017-12-03 ENCOUNTER — Other Ambulatory Visit: Payer: Self-pay

## 2017-12-03 DIAGNOSIS — R110 Nausea: Secondary | ICD-10-CM

## 2017-12-03 MED ORDER — ONDANSETRON 4 MG PO TBDP
4.0000 mg | ORAL_TABLET | Freq: Three times a day (TID) | ORAL | 2 refills | Status: DC | PRN
Start: 2017-12-03 — End: 2018-02-11

## 2017-12-09 ENCOUNTER — Other Ambulatory Visit: Payer: Self-pay

## 2017-12-09 DIAGNOSIS — M150 Primary generalized (osteo)arthritis: Secondary | ICD-10-CM

## 2017-12-09 MED ORDER — MELOXICAM 15 MG PO TABS: 15.0000 mg | ORAL_TABLET | Freq: Every day | ORAL | 3 refills | Status: DC

## 2018-02-11 ENCOUNTER — Ambulatory Visit: Payer: Commercial Managed Care - PPO | Admitting: Nurse Practitioner

## 2018-02-11 ENCOUNTER — Encounter: Payer: Self-pay | Admitting: Nurse Practitioner

## 2018-02-11 VITALS — BP 142/80 | HR 70 | Resp 16 | Ht 61.0 in | Wt 171.0 lb

## 2018-02-11 DIAGNOSIS — F321 Major depressive disorder, single episode, moderate: Secondary | ICD-10-CM | POA: Diagnosis not present

## 2018-02-11 DIAGNOSIS — D509 Iron deficiency anemia, unspecified: Secondary | ICD-10-CM

## 2018-02-11 DIAGNOSIS — I10 Essential (primary) hypertension: Secondary | ICD-10-CM | POA: Diagnosis not present

## 2018-02-11 DIAGNOSIS — R110 Nausea: Secondary | ICD-10-CM | POA: Diagnosis not present

## 2018-02-11 DIAGNOSIS — Z1231 Encounter for screening mammogram for malignant neoplasm of breast: Secondary | ICD-10-CM | POA: Diagnosis not present

## 2018-02-11 DIAGNOSIS — F5101 Primary insomnia: Secondary | ICD-10-CM | POA: Diagnosis not present

## 2018-02-11 DIAGNOSIS — Z1239 Encounter for other screening for malignant neoplasm of breast: Secondary | ICD-10-CM

## 2018-02-11 DIAGNOSIS — M150 Primary generalized (osteo)arthritis: Secondary | ICD-10-CM

## 2018-02-11 DIAGNOSIS — Z124 Encounter for screening for malignant neoplasm of cervix: Secondary | ICD-10-CM | POA: Insufficient documentation

## 2018-02-11 MED ORDER — FERROUS SULFATE 325 (65 FE) MG PO TABS: 325.0000 mg | ORAL_TABLET | Freq: Every day | ORAL | 5 refills | Status: AC

## 2018-02-11 MED ORDER — ESCITALOPRAM OXALATE 10 MG PO TABS: 10.0000 mg | ORAL_TABLET | Freq: Every day | ORAL | 3 refills | Status: DC

## 2018-02-11 MED ORDER — TRAZODONE HCL 100 MG PO TABS: 100.0000 mg | ORAL_TABLET | Freq: Every evening | ORAL | 3 refills | Status: DC | PRN

## 2018-02-11 MED ORDER — ONDANSETRON 4 MG PO TBDP: 4.0000 mg | ORAL_TABLET | Freq: Three times a day (TID) | ORAL | 2 refills | Status: DC | PRN

## 2018-02-11 MED ORDER — TRAMADOL HCL 50 MG PO TABS: 100.0000 mg | ORAL_TABLET | Freq: Two times a day (BID) | ORAL | 3 refills | Status: DC | PRN

## 2018-02-11 MED ORDER — MELOXICAM 15 MG PO TABS: 15.0000 mg | ORAL_TABLET | Freq: Every day | ORAL | 3 refills | Status: DC

## 2018-02-11 NOTE — Progress Notes (Signed)
[facility] [address] [place], [place] [address]  Internal MEDICINE  Office Visit Note  Patient Name: Alexandria Murray  [id number]  [id number]  Date of Service: 02/11/2018  No chief complaint on file.   Left arm continues to have pain and weakness since she suffered from [place] berret syndrome. She takes meloxicam to treat pain and inflammation and tramadol as needed for more severe pain.  She finds that, recently, she is only having to take the tramadol once before work. Overall, she is doing better with pain control.       Pt is here for routine follow up.    Current Medication: Outpatient Encounter Medications as of 02/11/2018  Medication Sig  . acetaminophen (TYLENOL) 500 MG tablet Take 1,000 mg by mouth every 6 (six) hours as needed for mild pain.  . Aspirin-Acetaminophen-Caffeine (PAMPRIN MAX PO) Take 2 tablets by mouth daily.  Marland Kitchen. docusate sodium (COLACE) 100 MG capsule Take 1 capsule (100 mg total) by mouth 2 (two) times daily.  Marland Kitchen. escitalopram (LEXAPRO) 10 MG tablet Take 1 tablet (10 mg total) by mouth daily.  . ferrous sulfate 325 (65 FE) MG tablet Take 1 tablet (325 mg total) by mouth daily.  . folic acid (FOLVITE) 0.5 MG tablet Take 0.5 mg by mouth daily.  . meloxicam (MOBIC) 15 MG tablet Take 1 tablet (15 mg total) by mouth daily.  . Multiple Vitamin (MULTIVITAMIN WITH MINERALS) TABS tablet Take 1 tablet by mouth daily.  . Multiple Vitamins-Minerals (MULTIVITAMIN PO) Take 1 tablet by mouth daily.  . nicotine (NICOTROL) 10 MG inhaler Inhale 1 cartridge (1 continuous puffing total) into the lungs as needed for smoking cessation. (Patient not taking: Reported on 11/12/2017)  . ondansetron (ZOFRAN ODT) 4 MG disintegrating tablet Take 1 tablet (4 mg total) by mouth every 8 (eight) hours as needed for nausea or vomiting.  . traMADol (ULTRAM) 50 MG tablet Take 2 tablets (100 mg total) by mouth every 12 (twelve) hours as needed for moderate pain.  . traZODone  (DESYREL) 100 MG tablet Take 1 tablet (100 mg total) by mouth at bedtime as needed for sleep.  . [DISCONTINUED] escitalopram (LEXAPRO) 10 MG tablet Take 1 tablet (10 mg total) by mouth daily.  . [DISCONTINUED] ferrous sulfate 325 (65 FE) MG tablet Take 325 mg by mouth daily.  . [DISCONTINUED] HYDROcodone-acetaminophen (NORCO) 5-325 MG tablet Take 1 tablet by mouth every 6 (six) hours as needed for moderate pain. (Patient not taking: Reported on 11/12/2017)  . [DISCONTINUED] meloxicam (MOBIC) 15 MG tablet Take 1 tablet (15 mg total) by mouth daily.  . [DISCONTINUED] ondansetron (ZOFRAN ODT) 4 MG disintegrating tablet Take 1 tablet (4 mg total) by mouth every 8 (eight) hours as needed for nausea or vomiting.  . [DISCONTINUED] traMADol (ULTRAM) 50 MG tablet Take 2 tablets (100 mg total) by mouth every 12 (twelve) hours as needed for moderate pain.  . [DISCONTINUED] traZODone (DESYREL) 100 MG tablet Take 1 tablet (100 mg total) by mouth at bedtime as needed for sleep.   No facility-administered encounter medications on file as of 02/11/2018.     Surgical History: Past Surgical History:  Procedure Laterality Date  . CHOLECYSTECTOMY    . COLPOSCOPY  07/26/2015  . FINGER SURGERY      Medical History: Past Medical History:  Diagnosis Date  . Abnormal Pap smear of cervix 06/20/2015  . Anxiety   . 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 Dysplasia of cervix,  low grade (CIN 1) 07/26/2015  . Guillain-Barre syndrome (HCC)   . Nicotine dependence   . Stroke (HCC)   . Torn rotator cuff     Family History: Family History  Problem Relation Age of Onset  . Cancer Mother   . Stroke Paternal Grandfather     Social History   Socioeconomic History  . Marital status: Married    Spouse name: Not on file  . Number of children: Not on file  . Years of education: Not on file  . Highest education level: Not on file  Occupational History  . Not on file  Social Needs  .  Financial resource strain: Not on file  . Food insecurity:    Worry: Not on file    Inability: Not on file  . Transportation needs:    Medical: Not on file    Non-medical: Not on file  Tobacco Use  . Smoking status: Current Every Day Smoker    Packs/day: 2.00    Types: Cigarettes  . Smokeless tobacco: Never Used  Substance and Sexual Activity  . Alcohol use: No  . Drug use: No  . Sexual activity: Yes    Birth control/protection: Post-menopausal  Lifestyle  . Physical activity:    Days per week: Not on file    Minutes per session: Not on file  . Stress: Not on file  Relationships  . Social connections:    Talks on phone: Not on file    Gets together: Not on file    Attends religious service: Not on file    Active member of club or organization: Not on file    Attends meetings of clubs or organizations: Not on file    Relationship status: Not on file  . Intimate partner violence:    Fear of current or ex partner: Not on file    Emotionally abused: Not on file    Physically abused: Not on file    Forced sexual activity: Not on file  Other Topics Concern  . Not on file  Social History Narrative  . Not on file      Review of Systems  Constitutional: Negative for activity change, chills, fatigue, fever and unexpected weight change.       Fever of 100.2 yesterday.   HENT: Negative for congestion, postnasal drip, rhinorrhea, sneezing and sore throat.   Eyes: Negative.  Negative for redness.  Respiratory: Negative for cough, chest tightness and shortness of breath.        Right sided chest discomfort.   Cardiovascular: Negative for chest pain and palpitations.  Gastrointestinal: Positive for abdominal distention and abdominal pain. Negative for constipation, diarrhea, nausea and vomiting.  Endocrine: Negative.   Genitourinary: Negative for dysuria and frequency.  Musculoskeletal: Positive for arthralgias, back pain and myalgias. Negative for joint swelling and neck pain.   Skin: Negative.  Negative for rash.  Neurological: Positive for headaches. Negative for tremors and numbness.  Psychiatric/Behavioral: Positive for dysphoric mood and sleep disturbance. Negative for behavioral problems (Depression) and suicidal ideas. The patient is nervous/anxious.        Well controlled on current medication.    Today's Vitals   02/11/18 0840  BP: (!) 142/80  Pulse: 70  Resp: 16  SpO2: 94%  Weight: 171 lb (77.6 kg)  Height: 5\' 1"  (1.549 m)    Physical Exam  Constitutional: She is oriented to person, place, and time. She appears well-developed and well-nourished.  HENT:  Head: Normocephalic and atraumatic.  Eyes:  Pupils are equal, round, and reactive to light.  Neck: Normal range of motion. Neck supple.  Cardiovascular: Normal rate, regular rhythm and normal heart sounds.  Pulmonary/Chest: Effort normal and breath sounds normal. She has no wheezes.  Abdominal: Soft. Bowel sounds are normal. She exhibits no distension. There is no tenderness. There is no guarding.  Musculoskeletal: Normal range of motion.  Chronic tendernes in left shoulder and arm as well as pain in both hands. ROM and strength are at their baseline in left arm. ROM and strength are intact in both hands.   Lymphadenopathy:    She has no cervical adenopathy.  Neurological: She is alert and oriented to person, place, and time.  Skin: Skin is warm and dry. Capillary refill takes 2 to 3 seconds.  Psychiatric: She has a normal mood and affect. Her behavior is normal. Judgment and thought content normal.  Nursing note and vitals reviewed.   Assessment/Plan:  1. Essential hypertension Generally stable. Continue to monitor closely.   2. Primary generalized (osteo)arthritis - meloxicam (MOBIC) 15 MG tablet; Take 1 tablet (15 mg total) by mouth daily.  Dispense: 30 tablet; Refill: 3 - traMADol (ULTRAM) 50 MG tablet; Take 2 tablets (100 mg total) by mouth every 12 (twelve) hours as needed for  moderate pain.  Dispense: 120 tablet; Refill: 3  3. Depression, major, single episode, moderate (HCC) - escitalopram (LEXAPRO) 10 MG tablet; Take 1 tablet (10 mg total) by mouth daily.  Dispense: 30 tablet; Refill: 3  4. Primary insomnia - traZODone (DESYREL) 100 MG tablet; Take 1 tablet (100 mg total) by mouth at bedtime as needed for sleep.  Dispense: 30 tablet; Refill: 3  5. Iron deficiency anemia, unspecified iron deficiency anemia type - ferrous sulfate 325 (65 FE) MG tablet; Take 1 tablet (325 mg total) by mouth daily.  Dispense: 30 tablet; Refill: 5  6. Nausea - ondansetron (ZOFRAN ODT) 4 MG disintegrating tablet; Take 1 tablet (4 mg total) by mouth every 8 (eight) hours as needed for nausea or vomiting.  Dispense: 90 tablet; Refill: 2  7. Screening for breast cancer - MM DIGITAL SCREENING BILATERAL; Future General Counseling: Katerin verbalizes understanding of the findings of todays visit and agrees with plan of treatment. I have discussed any further diagnostic evaluation that may be needed or ordered today. We also reviewed her medications today. she has been encouraged to call the office with any questions or concerns that should arise related to todays visit.  Hypertension Counseling:   The following hypertensive lifestyle modification were recommended and discussed:  1. Limiting alcohol intake to less than 1 oz/day of ethanol:(24 oz of beer or 8 oz of wine or 2 oz of 100-proof whiskey). 2. Take baby ASA 81 mg daily. 3. Importance of regular aerobic exercise and losing weight. 4. Reduce dietary saturated fat and cholesterol intake for overall cardiovascular health. 5. Maintaining adequate dietary potassium, calcium, and magnesium intake. 6. Regular monitoring of the blood pressure. 7. Reduce sodium intake to less than 100 mmol/day (less than 2.3 gm of sodium or less than 6 gm of sodium choride)   This patient was seen by Vincent Gros, FNP- C in Collaboration with Dr Lyndon Code as a part of collaborative care agreement  Orders Placed This Encounter  Procedures  . MM DIGITAL SCREENING BILATERAL    Meds ordered this encounter  Medications  . escitalopram (LEXAPRO) 10 MG tablet    Sig: Take 1 tablet (10 mg total) by mouth daily.  Dispense:  30 tablet    Refill:  3    Order Specific Question:   Supervising Provider    Answer:   Lyndon Code [[id number]]  . ferrous sulfate 325 (65 FE) MG tablet    Sig: Take 1 tablet (325 mg total) by mouth daily.    Dispense:  30 tablet    Refill:  5    Order Specific Question:   Supervising Provider    Answer:   Lyndon Code [[id number]]  . meloxicam (MOBIC) 15 MG tablet    Sig: Take 1 tablet (15 mg total) by mouth daily.    Dispense:  30 tablet    Refill:  3    Order Specific Question:   Supervising Provider    Answer:   Lyndon Code [[id number]]  . traMADol (ULTRAM) 50 MG tablet    Sig: Take 2 tablets (100 mg total) by mouth every 12 (twelve) hours as needed for moderate pain.    Dispense:  120 tablet    Refill:  3    Order Specific Question:   Supervising Provider    Answer:   Lyndon Code [[id number]]  . traZODone (DESYREL) 100 MG tablet    Sig: Take 1 tablet (100 mg total) by mouth at bedtime as needed for sleep.    Dispense:  30 tablet    Refill:  3    Order Specific Question:   Supervising Provider    Answer:   Lyndon Code [[id number]]  . ondansetron (ZOFRAN ODT) 4 MG disintegrating tablet    Sig: Take 1 tablet (4 mg total) by mouth every 8 (eight) hours as needed for nausea or vomiting.    Dispense:  90 tablet    Refill:  2    Order Specific Question:   Supervising Provider    Answer:   Lyndon Code [[id number]]    Time spent:       Dr Lyndon Code Internal medicine

## 2018-03-24 DIAGNOSIS — W1830XA Fall on same level, unspecified, initial encounter: Secondary | ICD-10-CM | POA: Diagnosis not present

## 2018-03-24 DIAGNOSIS — Z79899 Other long term (current) drug therapy: Secondary | ICD-10-CM | POA: Diagnosis not present

## 2018-03-24 DIAGNOSIS — Y929 Unspecified place or not applicable: Secondary | ICD-10-CM | POA: Diagnosis not present

## 2018-03-24 DIAGNOSIS — Y939 Activity, unspecified: Secondary | ICD-10-CM | POA: Diagnosis not present

## 2018-03-24 DIAGNOSIS — S0101XA Laceration without foreign body of scalp, initial encounter: Secondary | ICD-10-CM | POA: Diagnosis not present

## 2018-03-24 DIAGNOSIS — F17210 Nicotine dependence, cigarettes, uncomplicated: Secondary | ICD-10-CM | POA: Diagnosis not present

## 2018-03-24 DIAGNOSIS — S0990XA Unspecified injury of head, initial encounter: Secondary | ICD-10-CM | POA: Diagnosis present

## 2018-03-24 DIAGNOSIS — Y990 Civilian activity done for income or pay: Secondary | ICD-10-CM | POA: Diagnosis not present

## 2018-03-24 DIAGNOSIS — Z8673 Personal history of transient ischemic attack (TIA), and cerebral infarction without residual deficits: Secondary | ICD-10-CM | POA: Diagnosis not present

## 2018-03-25 ENCOUNTER — Other Ambulatory Visit: Payer: Self-pay

## 2018-03-25 ENCOUNTER — Emergency Department
Admission: EM | Admit: 2018-03-25 | Discharge: 2018-03-25 | Disposition: A | Discharge status: Home / Self Care | Payer: No Typology Code available for payment source | Attending: Emergency Medicine | Admitting: Emergency Medicine

## 2018-03-25 ENCOUNTER — Encounter: Payer: Self-pay | Admitting: Emergency Medicine

## 2018-03-25 DIAGNOSIS — Z79899 Other long term (current) drug therapy: Secondary | ICD-10-CM | POA: Insufficient documentation

## 2018-03-25 DIAGNOSIS — Y990 Civilian activity done for income or pay: Secondary | ICD-10-CM | POA: Insufficient documentation

## 2018-03-25 DIAGNOSIS — F17210 Nicotine dependence, cigarettes, uncomplicated: Secondary | ICD-10-CM | POA: Insufficient documentation

## 2018-03-25 DIAGNOSIS — Y939 Activity, unspecified: Secondary | ICD-10-CM | POA: Insufficient documentation

## 2018-03-25 DIAGNOSIS — Y929 Unspecified place or not applicable: Secondary | ICD-10-CM | POA: Insufficient documentation

## 2018-03-25 DIAGNOSIS — Z8673 Personal history of transient ischemic attack (TIA), and cerebral infarction without residual deficits: Secondary | ICD-10-CM | POA: Insufficient documentation

## 2018-03-25 DIAGNOSIS — S0990XA Unspecified injury of head, initial encounter: Secondary | ICD-10-CM

## 2018-03-25 DIAGNOSIS — W1830XA Fall on same level, unspecified, initial encounter: Secondary | ICD-10-CM | POA: Insufficient documentation

## 2018-03-25 DIAGNOSIS — S0101XA Laceration without foreign body of scalp, initial encounter: Secondary | ICD-10-CM

## 2018-03-25 NOTE — ED Provider Notes (Signed)
[facility] Emergency Department Provider Note ____________________________________________   First MD Initiated Contact with Patient 03/25/18 [phone]     (approximate)  I have reviewed the triage vital signs and the nursing notes.   HISTORY  Chief Complaint Fall and Head Laceration    HPI Alexandria Murray is a 42 y.o. female with PMH as noted below who presents with head injury, acute onset when she slipped and fell from standing height backwards onto concrete while at work.  She reports bleeding from the scalp, but denies LOC, and denies any other injuries.  She has no headache, nausea, or lightheadedness.  Past Medical History:  Diagnosis Date  . Abnormal Pap smear of cervix 06/20/2015  . Anxiety   . 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 Guillain-Barre syndrome (HCC)   . Nicotine dependence   . Stroke (HCC)   . Torn rotator cuff     Patient Active Problem List   Diagnosis Date Noted  . Primary generalized (osteo)arthritis 02/11/2018  . Depression, major, single episode, moderate (HCC) 02/11/2018  . Primary insomnia 02/11/2018  . Nausea 02/11/2018  . Screening for breast cancer 02/11/2018  . Pain in unspecified shoulder 11/11/2017  . Mixed hyperlipidemia 11/11/2017  . Generalized anxiety disorder 11/11/2017  . Nicotine dependence, cigarettes, uncomplicated 11/11/2017  . Iron deficiency anemia 11/11/2017  . CIN II (cervical intraepithelial neoplasia II) 07/13/2017  . Left ovarian cyst 04/20/2017  . Bell's palsy 06/05/2015  . Left shoulder tendonitis 06/05/2015  . HTN (hypertension) 06/05/2015  . Smoker   . Guillain Barr syndrome (HCC) 05/28/2015    Past Surgical History:  Procedure Laterality Date  . CHOLECYSTECTOMY    . COLPOSCOPY  07/26/2015  . FINGER SURGERY      Prior to Admission medications   Medication Sig Start Date End Date Taking? Authorizing  Provider  acetaminophen (TYLENOL) 500 MG tablet Take 1,000 mg by mouth every 6 (six) hours as needed for mild pain.    [provider]  Aspirin-Acetaminophen-Caffeine (PAMPRIN MAX PO) Take 2 tablets by mouth daily.    [provider]  docusate sodium (COLACE) 100 MG capsule Take 1 capsule (100 mg total) by mouth 2 (two) times daily. 06/05/15   Katharina Caper, MD  escitalopram (LEXAPRO) 10 MG tablet Take 1 tablet (10 mg total) by mouth daily. 02/11/18   Carlean Jews, NP  ferrous sulfate 325 (65 FE) MG tablet Take 1 tablet (325 mg total) by mouth daily. 02/11/18   Carlean Jews, NP  folic acid (FOLVITE) 0.5 MG tablet Take 0.5 mg by mouth daily.    [provider]  meloxicam (MOBIC) 15 MG tablet Take 1 tablet (15 mg total) by mouth daily. 02/11/18   Carlean Jews, NP  Multiple Vitamin (MULTIVITAMIN WITH MINERALS) TABS tablet Take 1 tablet by mouth daily. 06/05/15   Katharina Caper, MD  Multiple Vitamins-Minerals (MULTIVITAMIN PO) Take 1 tablet by mouth daily.    [provider]  nicotine (NICOTROL) 10 MG inhaler Inhale 1 cartridge (1 continuous puffing total) into the lungs as needed for smoking cessation. Patient not taking: Reported on 11/12/2017 06/05/15   Katharina Caper, MD  ondansetron (ZOFRAN ODT) 4 MG disintegrating tablet Take 1 tablet (4 mg total) by mouth every 8 (eight) hours as needed for nausea or vomiting. 02/11/18   Carlean Jews, NP  traMADol (ULTRAM) 50 MG tablet Take 2 tablets (100  mg total) by mouth every 12 (twelve) hours as needed for moderate pain. 02/11/18   Carlean Jews, NP  traZODone (DESYREL) 100 MG tablet Take 1 tablet (100 mg total) by mouth at bedtime as needed for sleep. 02/11/18   Carlean Jews, NP    Allergies Percocet [oxycodone-acetaminophen]  Family History  Problem Relation Age of Onset  . Cancer Mother   . Stroke Paternal Grandfather     Social History Social History   Tobacco Use  . Smoking status:  Current Every Day Smoker    Packs/day: 2.00    Types: Cigarettes  . Smokeless tobacco: Never Used  Substance Use Topics  . Alcohol use: No  . Drug use: No    Review of Systems  Constitutional: No fever. Eyes: No visual changes. ENT: No neck pain. Cardiovascular: Denies chest pain. Respiratory: Denies shortness of breath. Gastrointestinal: No nausea or vomiting. Genitourinary: Negative for flank pain.  Musculoskeletal: Negative for back pain. Skin: Positive for laceration. Neurological: Negative for headache.   ____________________________________________   PHYSICAL EXAM:  VITAL SIGNS: ED Triage Vitals  Enc Vitals Group     BP 03/25/18 0007 (!) 165/102     Pulse Rate 03/25/18 0007 85     Resp 03/25/18 0007 20     Temp 03/25/18 0007 98.2 F (36.8 C)     Temp Source 03/25/18 0007 Oral     SpO2 03/25/18 0007 96 %     Weight 03/25/18 0008 166 lb (75.3 kg)     Height 03/25/18 0008 5' 1.5" (1.562 m)     Head Circumference --      Peak Flow --      Pain Score 03/25/18 0007 5     Pain Loc --      Pain Edu? --      Excl. in GC? --     Constitutional: Alert and oriented. Well appearing and in no acute distress. Eyes: Conjunctivae are normal.  EOMI.  PERRLA. Head: 2 cm right occipital scalp laceration. Nose: No congestion/rhinnorhea. Mouth/Throat: Mucous membranes are moist.   Neck: Normal range of motion.  No midline C-spine tenderness. Cardiovascular:  Good peripheral circulation. Respiratory: Normal respiratory effort.   Gastrointestinal: No distention.  Musculoskeletal:  Extremities warm and well perfused.  Neurologic:  Normal speech and language.  Normal coordination with no ataxia.  Motor and sensory intact in all extremities.  Normal gait.  No gross focal neurologic deficits are appreciated.  Skin:  Skin is warm and dry. No rash noted. Psychiatric: Mood and affect are normal. Speech and behavior are normal.  ____________________________________________     LABS (all labs ordered are listed, but only abnormal results are displayed)  Labs Reviewed - No data to display ____________________________________________  EKG   ____________________________________________  RADIOLOGY    ____________________________________________   PROCEDURES  Procedure(s) performed: No  ..Laceration Repair Date/Time: 03/25/2018 3:03 AM Performed by: Dionne Bucy, MD Authorized by: Dionne Bucy, MD   Consent:    Consent obtained:  Verbal   Consent given by:  Patient Anesthesia (see MAR for exact dosages):    Anesthesia method:  None Laceration details:    Location:  Scalp   Scalp location:  Occipital   Length (cm):  2 Repair type:    Repair type:  Simple Treatment:    Amount of cleaning:  Standard Skin repair:    Repair method:  Staples   Number of staples:  3 Approximation:    Approximation:  Close Post-procedure details:  Dressing:  Open (no dressing)   Patient tolerance of procedure:  Tolerated well, no immediate complications    Critical Care performed: No ____________________________________________   INITIAL IMPRESSION / ASSESSMENT AND PLAN / ED COURSE  Pertinent labs & imaging results that were available during my care of the patient were reviewed by me and considered in my medical decision making (see chart for details).  42 year old female presents with head injury after mechanical fall from standing height, with laceration to the right occipital area.  No LOC or evidence of significant head injury.  Neuro exam is nonfocal.  The remainder the exam is unremarkable, and the patient is extremely well-appearing.  Laceration was successfully repaired with stapling.  There is no indication for imaging at this time.  The patient feels comfortable and would like to go home.  Return precautions given, and she expresses understanding.      ____________________________________________   FINAL CLINICAL  IMPRESSION(S) / ED DIAGNOSES  Final diagnoses:  Laceration of scalp, initial encounter  Injury of head, initial encounter      NEW MEDICATIONS STARTED DURING THIS VISIT:  New Prescriptions   No medications on file     Note:  This document was prepared using Dragon voice recognition software and may include unintentional dictation errors.    Dionne Bucy, MD 03/25/18 [phone]

## 2018-03-25 NOTE — Discharge Instructions (Addendum)
Follow-up in 1 week to have the staples removed.  Return to the ER for new, worsening, or persistent severe headache, weakness, lightheadedness, or any other new or worsening symptoms that concern you.

## 2018-03-25 NOTE — ED Triage Notes (Signed)
Patient to ER for c/o lac to back of head after fall at work. Patient states she slipped on a piece of plastic and fell on concrete. Patient denies any severe headache. Denies any LOC. Patient ambulatory to triage without difficulty. No neuro deficits present.  Patient does require worker's comp. Patient arrives to ER with gauze wrap in place.

## 2018-03-25 NOTE — ED Notes (Signed)
Patient's head rewrapped with gauze wrap. Bleeding controlled at this time.

## 2018-04-09 ENCOUNTER — Other Ambulatory Visit: Payer: Self-pay

## 2018-04-09 ENCOUNTER — Encounter: Payer: Self-pay | Admitting: Emergency Medicine

## 2018-04-09 ENCOUNTER — Emergency Department
Admission: EM | Admit: 2018-04-09 | Discharge: 2018-04-09 | Disposition: A | Discharge status: Home / Self Care | Payer: Commercial Managed Care - PPO | Attending: Emergency Medicine | Admitting: Emergency Medicine

## 2018-04-09 DIAGNOSIS — Z79899 Other long term (current) drug therapy: Secondary | ICD-10-CM | POA: Insufficient documentation

## 2018-04-09 DIAGNOSIS — I10 Essential (primary) hypertension: Secondary | ICD-10-CM | POA: Insufficient documentation

## 2018-04-09 DIAGNOSIS — Z4802 Encounter for removal of sutures: Secondary | ICD-10-CM | POA: Insufficient documentation

## 2018-04-09 DIAGNOSIS — F17210 Nicotine dependence, cigarettes, uncomplicated: Secondary | ICD-10-CM | POA: Insufficient documentation

## 2018-04-09 NOTE — ED Triage Notes (Signed)
Patient ambulatory to triage with steady gait, without difficulty or distress noted; pt reports here for staple removal; denies any c/o

## 2018-04-09 NOTE — Discharge Instructions (Addendum)
Follow-up with your primary care provider if any continued problems or concerns. 

## 2018-04-09 NOTE — ED Notes (Signed)
See triage note    Presents for staple removal   Staples intact and healing well

## 2018-04-09 NOTE — ED Provider Notes (Signed)
[facility] Emergency Department Provider Note  ____________________________________________   First MD Initiated Contact with Patient 04/09/18 [phone]     (approximate)  I have reviewed the triage vital signs and the nursing notes.   HISTORY  Chief Complaint Suture / Staple Removal   HPI Alexandria Murray is a 42 y.o. female here for staple removal.  Patient was seen on 03/25/2018 for laceration to her scalp.  She denies any fever, chills, drainage or pain.    Past Medical History:  Diagnosis Date  . Abnormal Pap smear of cervix 06/20/2015  . Anxiety   . 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 Guillain-Barre syndrome (HCC)   . Nicotine dependence   . Stroke (HCC)   . Torn rotator cuff     Patient Active Problem List   Diagnosis Date Noted  . Primary generalized (osteo)arthritis 02/11/2018  . Depression, major, single episode, moderate (HCC) 02/11/2018  . Primary insomnia 02/11/2018  . Nausea 02/11/2018  . Screening for breast cancer 02/11/2018  . Pain in unspecified shoulder 11/11/2017  . Mixed hyperlipidemia 11/11/2017  . Generalized anxiety disorder 11/11/2017  . Nicotine dependence, cigarettes, uncomplicated 11/11/2017  . Iron deficiency anemia 11/11/2017  . CIN II (cervical intraepithelial neoplasia II) 07/13/2017  . Left ovarian cyst 04/20/2017  . Bell's palsy 06/05/2015  . Left shoulder tendonitis 06/05/2015  . HTN (hypertension) 06/05/2015  . Smoker   . Guillain Barr syndrome (HCC) 05/28/2015    Past Surgical History:  Procedure Laterality Date  . CHOLECYSTECTOMY    . COLPOSCOPY  07/26/2015  . FINGER SURGERY      Prior to Admission medications   Medication Sig Start Date End Date Taking? Authorizing Provider  acetaminophen (TYLENOL) 500 MG tablet Take 1,000 mg by mouth every 6 (six) hours as needed for mild pain.    [provider]    Aspirin-Acetaminophen-Caffeine (PAMPRIN MAX PO) Take 2 tablets by mouth daily.    [provider]  docusate sodium (COLACE) 100 MG capsule Take 1 capsule (100 mg total) by mouth 2 (two) times daily. 06/05/15   Katharina CaperVaickute, Rima, MD  escitalopram (LEXAPRO) 10 MG tablet Take 1 tablet (10 mg total) by mouth daily. 02/11/18   Carlean JewsBoscia, Heather E, NP  ferrous sulfate 325 (65 FE) MG tablet Take 1 tablet (325 mg total) by mouth daily. 02/11/18   Carlean JewsBoscia, Heather E, NP  folic acid (FOLVITE) 0.5 MG tablet Take 0.5 mg by mouth daily.    [provider]  meloxicam (MOBIC) 15 MG tablet Take 1 tablet (15 mg total) by mouth daily. 02/11/18   Carlean JewsBoscia, Heather E, NP  Multiple Vitamin (MULTIVITAMIN WITH MINERALS) TABS tablet Take 1 tablet by mouth daily. 06/05/15   Katharina CaperVaickute, Rima, MD  Multiple Vitamins-Minerals (MULTIVITAMIN PO) Take 1 tablet by mouth daily.    [provider]  traMADol (ULTRAM) 50 MG tablet Take 2 tablets (100 mg total) by mouth every 12 (twelve) hours as needed for moderate pain. 02/11/18   Carlean JewsBoscia, Heather E, NP  traZODone (DESYREL) 100 MG tablet Take 1 tablet (100 mg total) by mouth at bedtime as needed for sleep. 02/11/18   Carlean JewsBoscia, Heather E, NP    Allergies Percocet [oxycodone-acetaminophen]  Family History  Problem Relation Age of Onset  . Cancer Mother   . Stroke Paternal Grandfather     Social History Social History   Tobacco Use  . Smoking status:  Current Every Day Smoker    Packs/day: 2.00    Types: Cigarettes  . Smokeless tobacco: Never Used  Substance Use Topics  . Alcohol use: No  . Drug use: No    Review of Systems Constitutional: No fever/chills Eyes: No visual changes. Cardiovascular: Denies chest pain. Respiratory: Denies shortness of breath. Musculoskeletal: Negative for back pain. Skin: Healing laceration. Neurological: Negative for headaches ____________________________________________   PHYSICAL EXAM:  VITAL SIGNS: ED Triage Vitals  [04/09/18 0658]  Enc Vitals Group     BP      Pulse      Resp      Temp      Temp src      SpO2      Weight 166 lb (75.3 kg)     Height 5\' 1"  (1.549 m)     Head Circumference      Peak Flow      Pain Score 0     Pain Loc      Pain Edu?      Excl. in GC?    Constitutional: Alert and oriented. Well appearing and in no acute distress. Eyes: Conjunctivae are normal.  Head: Atraumatic. Neck: No stridor.   Respiratory: Normal respiratory effort.  No retractions. Lungs CTAB. Musculoskeletal: No lower extremity tenderness nor edema.  No joint effusions. Neurologic:  Normal speech and language. No gross focal neurologic deficits are appreciated.  Skin:  Skin is warm, dry.  Healing stapled wound posterior scalp without any erythema or drainage noted. Psychiatric: Mood and affect are normal. Speech and behavior are normal.  ____________________________________________   LABS (all labs ordered are listed, but only abnormal results are displayed)  Labs Reviewed - No data to display  PROCEDURES  Procedure(s) performed: None  Procedures  Critical Care performed: No  ____________________________________________   INITIAL IMPRESSION / ASSESSMENT AND PLAN / ED COURSE  As part of my medical decision making, I reviewed the following data within the electronic MEDICAL RECORD NUMBER Notes from prior ED visits and [place] Controlled Substance Database  Patient is here for staple removal from a laceration of 14 days.  There is no evidence of infection and sutures were removed by RN.  Patient is to follow-up with her primary care provider if any problems or concerns.  ____________________________________________   FINAL CLINICAL IMPRESSION(S) / ED DIAGNOSES  Final diagnoses:  Encounter for staple removal     ED Discharge Orders    None       Note:  This document was prepared using Dragon voice recognition software and may include unintentional dictation errors.    Tommi Rumps, PA-C 04/09/18 [id number]    Jene Every, MD 04/09/18 [phone]

## 2018-05-20 ENCOUNTER — Ambulatory Visit: Payer: Commercial Managed Care - PPO | Admitting: Nurse Practitioner

## 2018-05-20 ENCOUNTER — Encounter: Payer: Self-pay | Admitting: Nurse Practitioner

## 2018-05-20 VITALS — BP 132/92 | HR 84 | Resp 16 | Ht 61.0 in | Wt 168.8 lb

## 2018-05-20 DIAGNOSIS — D509 Iron deficiency anemia, unspecified: Secondary | ICD-10-CM

## 2018-05-20 DIAGNOSIS — I10 Essential (primary) hypertension: Secondary | ICD-10-CM

## 2018-05-20 DIAGNOSIS — M150 Primary generalized (osteo)arthritis: Secondary | ICD-10-CM

## 2018-05-20 DIAGNOSIS — F321 Major depressive disorder, single episode, moderate: Secondary | ICD-10-CM | POA: Diagnosis not present

## 2018-05-20 DIAGNOSIS — F5101 Primary insomnia: Secondary | ICD-10-CM

## 2018-05-20 DIAGNOSIS — R110 Nausea: Secondary | ICD-10-CM

## 2018-05-20 MED ORDER — TRAMADOL HCL 50 MG PO TABS: 100.0000 mg | ORAL_TABLET | Freq: Two times a day (BID) | ORAL | 3 refills | Status: DC | PRN

## 2018-05-20 MED ORDER — ESCITALOPRAM OXALATE 10 MG PO TABS: 10.0000 mg | ORAL_TABLET | Freq: Every day | ORAL | 3 refills | Status: DC

## 2018-05-20 MED ORDER — MELOXICAM 15 MG PO TABS
15.0000 mg | ORAL_TABLET | Freq: Every day | ORAL | 3 refills | Status: DC
Start: 2018-05-20 — End: 2019-04-14

## 2018-05-20 MED ORDER — ONDANSETRON 4 MG PO TBDP: 4.0000 mg | ORAL_TABLET | Freq: Three times a day (TID) | ORAL | 3 refills | Status: DC | PRN

## 2018-05-20 MED ORDER — TRAZODONE HCL 100 MG PO TABS: 100.0000 mg | ORAL_TABLET | Freq: Every evening | ORAL | 3 refills | Status: DC | PRN

## 2018-05-20 MED ORDER — FOLIC ACID 1 MG PO TABS: 1.0000 mg | ORAL_TABLET | Freq: Every day | ORAL | 5 refills | Status: DC

## 2018-05-20 NOTE — Progress Notes (Signed)
[facility] [address] [place], [place] [id number]  Internal MEDICINE  Office Visit Note  Patient Name: Alexandria Murray  [id number]  [id number]  Date of Service: 05/20/2018  Chief Complaint  Patient presents with  . Hypertension    3 month follow up    Hypertension  This is a chronic problem. The current episode started more than 1 year ago. The problem has been waxing and waning since onset. Condition status: generally well controlled, but elevated today. Associated symptoms include headaches. Pertinent negatives include no chest pain, neck pain, palpitations or shortness of breath. Agents associated with hypertension include NSAIDs. Risk factors for coronary artery disease include obesity, smoking/tobacco exposure and stress. Past treatments include lifestyle changes. There are no compliance problems.  Hypertensive end-organ damage includes CVA.       Current Medication: Outpatient Encounter Medications as of 05/20/2018  Medication Sig  . acetaminophen (TYLENOL) 500 MG tablet Take 1,000 mg by mouth every 6 (six) hours as needed for mild pain.  . Aspirin-Acetaminophen-Caffeine (PAMPRIN MAX PO) Take 2 tablets by mouth daily.  Marland Kitchen. docusate sodium (COLACE) 100 MG capsule Take 1 capsule (100 mg total) by mouth 2 (two) times daily.  Marland Kitchen. escitalopram (LEXAPRO) 10 MG tablet Take 1 tablet (10 mg total) by mouth daily.  . ferrous sulfate 325 (65 FE) MG tablet Take 1 tablet (325 mg total) by mouth daily.  . meloxicam (MOBIC) 15 MG tablet Take 1 tablet (15 mg total) by mouth daily.  . Multiple Vitamin (MULTIVITAMIN WITH MINERALS) TABS tablet Take 1 tablet by mouth daily.  . Multiple Vitamins-Minerals (MULTIVITAMIN PO) Take 1 tablet by mouth daily.  . traMADol (ULTRAM) 50 MG tablet Take 2 tablets (100 mg total) by mouth every 12 (twelve) hours as needed for moderate pain.  . traZODone (DESYREL) 100 MG tablet Take 1 tablet (100 mg total) by mouth at bedtime as needed for sleep.  .  [DISCONTINUED] escitalopram (LEXAPRO) 10 MG tablet Take 1 tablet (10 mg total) by mouth daily.  . [DISCONTINUED] folic acid (FOLVITE) 0.5 MG tablet Take 0.5 mg by mouth daily.  . [DISCONTINUED] meloxicam (MOBIC) 15 MG tablet Take 1 tablet (15 mg total) by mouth daily.  . [DISCONTINUED] traMADol (ULTRAM) 50 MG tablet Take 2 tablets (100 mg total) by mouth every 12 (twelve) hours as needed for moderate pain.  . [DISCONTINUED] traZODone (DESYREL) 100 MG tablet Take 1 tablet (100 mg total) by mouth at bedtime as needed for sleep.  . folic acid (FOLVITE) 1 MG tablet Take 1 tablet (1 mg total) by mouth daily.  . ondansetron (ZOFRAN-ODT) 4 MG disintegrating tablet Take 1 tablet (4 mg total) by mouth every 8 (eight) hours as needed for nausea or vomiting.   No facility-administered encounter medications on file as of 05/20/2018.     Surgical History: Past Surgical History:  Procedure Laterality Date  . CHOLECYSTECTOMY    . COLPOSCOPY  07/26/2015  . FINGER SURGERY      Medical History: Past Medical History:  Diagnosis Date  . Abnormal Pap smear of cervix 06/20/2015  . Anxiety   . 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 Guillain-Barre syndrome (HCC)   . Nicotine dependence   . Stroke (HCC)   . Torn rotator cuff     Family History: Family History  Problem Relation Age of Onset  . Cancer Mother   . Stroke Paternal Grandfather  Social History   Socioeconomic History  . Marital status: Married    Spouse name: Not on file  . Number of children: Not on file  . Years of education: Not on file  . Highest education level: Not on file  Occupational History  . Not on file  Social Needs  . Financial resource strain: Not on file  . Food insecurity:    Worry: Not on file    Inability: Not on file  . Transportation needs:    Medical: Not on file    Non-medical: Not on file  Tobacco Use  . Smoking  status: Current Every Day Smoker    Packs/day: 2.00    Types: Cigarettes  . Smokeless tobacco: Never Used  Substance and Sexual Activity  . Alcohol use: No  . Drug use: No  . Sexual activity: Yes    Birth control/protection: Post-menopausal  Lifestyle  . Physical activity:    Days per week: Not on file    Minutes per session: Not on file  . Stress: Not on file  Relationships  . Social connections:    Talks on phone: Not on file    Gets together: Not on file    Attends religious service: Not on file    Active member of club or organization: Not on file    Attends meetings of clubs or organizations: Not on file    Relationship status: Not on file  . Intimate partner violence:    Fear of current or ex partner: Not on file    Emotionally abused: Not on file    Physically abused: Not on file    Forced sexual activity: Not on file  Other Topics Concern  . Not on file  Social History Narrative  . Not on file      Review of Systems  Constitutional: Negative for activity change, chills, fatigue, fever and unexpected weight change.  HENT: Negative for congestion, postnasal drip, rhinorrhea, sneezing and sore throat.   Eyes: Negative.  Negative for redness.  Respiratory: Negative for cough, chest tightness, shortness of breath and wheezing.   Cardiovascular: Negative for chest pain and palpitations.       Bp elevated today.  Gastrointestinal: Negative for abdominal distention, abdominal pain, constipation, diarrhea, nausea and vomiting.  Endocrine: Negative for cold intolerance, heat intolerance, polydipsia, polyphagia and polyuria.  Genitourinary: Negative.  Negative for dysuria and frequency.  Musculoskeletal: Positive for arthralgias, back pain and myalgias. Negative for joint swelling and neck pain.  Skin: Negative for rash.  Allergic/Immunologic: Negative for environmental allergies.  Neurological: Positive for headaches. Negative for tremors and numbness.  Hematological:  Negative for adenopathy.  Psychiatric/Behavioral: Positive for dysphoric mood and sleep disturbance. Negative for behavioral problems (Depression) and suicidal ideas. The patient is nervous/anxious.        Well controlled on current medication.   Today's Vitals   05/20/18 0840  BP: (!) 132/92  Pulse: 84  Resp: 16  SpO2: 95%  Weight: 168 lb 12.8 oz (76.6 kg)  Height: 5\' 1"  (1.549 m)     Physical Exam  Constitutional: She is oriented to person, place, and time. She appears well-developed and well-nourished.  HENT:  Head: Normocephalic and atraumatic.  Eyes: Pupils are equal, round, and reactive to light.  Neck: Normal range of motion. Neck supple.  Cardiovascular: Normal rate, regular rhythm and normal heart sounds.  Pulmonary/Chest: Effort normal and breath sounds normal. She has no wheezes.  Abdominal: Soft. Bowel sounds are normal. She exhibits no  distension. There is no tenderness. There is no guarding.  Musculoskeletal: Normal range of motion.  Chronic tendernes in left shoulder and arm as well as pain in both hands. ROM and strength are at their baseline in left arm. ROM and strength are intact in both hands.   Lymphadenopathy:    She has no cervical adenopathy.  Neurological: She is alert and oriented to person, place, and time.  Skin: Skin is warm and dry. Capillary refill takes 2 to 3 seconds.  Psychiatric: She has a normal mood and affect. Her behavior is normal. Judgment and thought content normal.  Nursing note and vitals reviewed.   Assessment/Plan: 1. Essential hypertension Generally stable. conitnue bp medication as prescribed.   2. Primary generalized (osteo)arthritis Meloxicam daily for pain/inflammation. Tramadol 100mg  twice daily if needed for more severe pain. Refills of both sent to pharmacy.  - meloxicam (MOBIC) 15 MG tablet; Take 1 tablet (15 mg total) by mouth daily.  Dispense: 30 tablet; Refill: 3 - traMADol (ULTRAM) 50 MG tablet; Take 2 tablets (100 mg  total) by mouth every 12 (twelve) hours as needed for moderate pain.  Dispense: 120 tablet; Refill: 3  3. Depression, major, single episode, moderate (HCC) - escitalopram (LEXAPRO) 10 MG tablet; Take 1 tablet (10 mg total) by mouth daily.  Dispense: 30 tablet; Refill: 3  4. Iron deficiency anemia, unspecified iron deficiency anemia type Continue OTC iron supplement  5. Primary insomnia - traZODone (DESYREL) 100 MG tablet; Take 1 tablet (100 mg total) by mouth at bedtime as needed for sleep.  Dispense: 30 tablet; Refill: 3  6. Nausea - ondansetron (ZOFRAN-ODT) 4 MG disintegrating tablet; Take 1 tablet (4 mg total) by mouth every 8 (eight) hours as needed for nausea or vomiting.  Dispense: 30 tablet; Refill: 3  General Counseling: Fareeda verbalizes understanding of the findings of todays visit and agrees with plan of treatment. I have discussed any further diagnostic evaluation that may be needed or ordered today. We also reviewed her medications today. she has been encouraged to call the office with any questions or concerns that should arise related to todays visit.    This patient was seen by Vincent Gros FNP Collaboration with Dr Lyndon Code as a part of collaborative care agreement  Meds ordered this encounter  Medications  . escitalopram (LEXAPRO) 10 MG tablet    Sig: Take 1 tablet (10 mg total) by mouth daily.    Dispense:  30 tablet    Refill:  3    Order Specific Question:   Supervising Provider    Answer:   Lyndon Code [[id number]]  . folic acid (FOLVITE) 1 MG tablet    Sig: Take 1 tablet (1 mg total) by mouth daily.    Dispense:  30 tablet    Refill:  5    Order Specific Question:   Supervising Provider    Answer:   Lyndon Code [[id number]]  . meloxicam (MOBIC) 15 MG tablet    Sig: Take 1 tablet (15 mg total) by mouth daily.    Dispense:  30 tablet    Refill:  3    Order Specific Question:   Supervising Provider    Answer:   Lyndon Code [[id number]]  . traMADol (ULTRAM) 50 MG  tablet    Sig: Take 2 tablets (100 mg total) by mouth every 12 (twelve) hours as needed for moderate pain.    Dispense:  120 tablet    Refill:  3  Order Specific Question:   Supervising Provider    Answer:   Lyndon Code [[id number]]  . traZODone (DESYREL) 100 MG tablet    Sig: Take 1 tablet (100 mg total) by mouth at bedtime as needed for sleep.    Dispense:  30 tablet    Refill:  3    Order Specific Question:   Supervising Provider    Answer:   Lyndon Code [[id number]]  . ondansetron (ZOFRAN-ODT) 4 MG disintegrating tablet    Sig: Take 1 tablet (4 mg total) by mouth every 8 (eight) hours as needed for nausea or vomiting.    Dispense:  30 tablet    Refill:  3    Order Specific Question:   Supervising Provider    Answer:   Lyndon Code [[id number]]    Time spent: 26 Minutes    Dr Lyndon Code Internal medicine

## 2018-07-21 ENCOUNTER — Telehealth: Payer: Self-pay

## 2018-07-21 NOTE — Telephone Encounter (Signed)
Send message to dfk  

## 2018-08-31 ENCOUNTER — Other Ambulatory Visit: Payer: Self-pay | Admitting: Nurse Practitioner

## 2018-08-31 DIAGNOSIS — M150 Primary generalized (osteo)arthritis: Secondary | ICD-10-CM

## 2018-09-03 ENCOUNTER — Ambulatory Visit: Payer: Commercial Managed Care - PPO | Admitting: Nurse Practitioner

## 2018-09-03 ENCOUNTER — Encounter: Payer: Self-pay | Admitting: Nurse Practitioner

## 2018-09-03 VITALS — BP 142/88 | HR 95 | Resp 16 | Ht 61.5 in | Wt 171.0 lb

## 2018-09-03 DIAGNOSIS — M150 Primary generalized (osteo)arthritis: Secondary | ICD-10-CM

## 2018-09-03 DIAGNOSIS — I10 Essential (primary) hypertension: Secondary | ICD-10-CM

## 2018-09-03 MED ORDER — TRAMADOL HCL 50 MG PO TABS: 100.0000 mg | ORAL_TABLET | Freq: Two times a day (BID) | ORAL | 3 refills | Status: DC | PRN

## 2018-09-03 NOTE — Progress Notes (Signed)
[facility] [address] [place], [place] [address]  Internal MEDICINE  Office Visit Note  Patient Name: Alexandria Murray  [id number]  [id number]  Date of Service: 09/03/2018  Chief Complaint  Patient presents with  . Hypertension  . 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 She takes meloxicam to treat pain and inflammation and tramadol as needed for more severe pain.  She finds that, recently, she is only having to take the tramadol once before work. Overall, she is doing better with pain control. She needs to have refills for this.          Current Medication: Outpatient Encounter Medications as of 09/03/2018  Medication Sig  . acetaminophen (TYLENOL) 500 MG tablet Take 1,000 mg by mouth every 6 (six) hours as needed for mild pain.  . Aspirin-Acetaminophen-Caffeine (PAMPRIN MAX PO) Take 2 tablets by mouth daily.  Marland Kitchen docusate sodium (COLACE) 100 MG capsule Take 1 capsule (100 mg total) by mouth 2 (two) times daily.  Marland Kitchen escitalopram (LEXAPRO) 10 MG tablet Take 1 tablet (10 mg total) by mouth daily.  . ferrous sulfate 325 (65 FE) MG tablet Take 1 tablet (325 mg total) by mouth daily.  . folic acid (FOLVITE) 1 MG tablet Take 1 tablet (1 mg total) by mouth daily.  Marland Kitchen gabapentin (NEURONTIN) 100 MG capsule Take 100 mg by mouth 2 (two) times daily.  . meloxicam (MOBIC) 15 MG tablet Take 1 tablet (15 mg total) by mouth daily.  . Multiple Vitamin (MULTIVITAMIN WITH MINERALS) TABS tablet Take 1 tablet by mouth daily.  . Multiple Vitamins-Minerals (MULTIVITAMIN PO) Take 1 tablet by mouth daily.  . ondansetron (ZOFRAN-ODT) 4 MG disintegrating tablet Take 1 tablet (4 mg total) by mouth every 8 (eight) hours as needed for nausea or vomiting.  . traMADol (ULTRAM) 50 MG tablet Take 2 tablets (100 mg total) by mouth every 12 (twelve) hours as needed for  moderate pain.  . traZODone (DESYREL) 100 MG tablet Take 1 tablet (100 mg total) by mouth at bedtime as needed for sleep.  . [DISCONTINUED] traMADol (ULTRAM) 50 MG tablet Take 2 tablets (100 mg total) by mouth every 12 (twelve) hours as needed for moderate pain.   No facility-administered encounter medications on file as of 09/03/2018.     Surgical History: Past Surgical History:  Procedure Laterality Date  . CHOLECYSTECTOMY    . COLPOSCOPY  07/26/2015  . FINGER SURGERY      Medical History: Past Medical History:  Diagnosis Date  . Abnormal Pap smear of cervix 06/20/2015  . Anxiety   . 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 Guillain-Barre syndrome (HCC)   . Nicotine dependence   . Stroke (HCC)   . Torn rotator cuff     Family History: Family History  Problem Relation Age of Onset  . Cancer Mother   . Stroke Paternal Grandfather     Social History   Socioeconomic History  . Marital status: Married    Spouse name: Not on file  . Number of children: Not on file  . Years of education: Not on file  . Highest education level: Not on file  Occupational History  . Not on file  Social Needs  . Financial resource strain: Not on file  .  Food insecurity:    Worry: Not on file    Inability: Not on file  . Transportation needs:    Medical: Not on file    Non-medical: Not on file  Tobacco Use  . Smoking status: Current Every Day Smoker    Packs/day: 2.00    Types: Cigarettes  . Smokeless tobacco: Never Used  Substance and Sexual Activity  . Alcohol use: No  . Drug use: No  . Sexual activity: Yes    Birth control/protection: Post-menopausal  Lifestyle  . Physical activity:    Days per week: Not on file    Minutes per session: Not on file  . Stress: Not on file  Relationships  . Social connections:    Talks on phone: Not on file    Gets together: Not on file    Attends religious  service: Not on file    Active member of club or organization: Not on file    Attends meetings of clubs or organizations: Not on file    Relationship status: Not on file  . Intimate partner violence:    Fear of current or ex partner: Not on file    Emotionally abused: Not on file    Physically abused: Not on file    Forced sexual activity: Not on file  Other Topics Concern  . Not on file  Social History Narrative  . Not on file      Review of Systems  Constitutional: Negative for activity change, chills, fatigue, fever and unexpected weight change.  HENT: Negative for congestion, postnasal drip, rhinorrhea, sneezing and sore throat.   Eyes: Negative.  Negative for redness.  Respiratory: Negative for cough, chest tightness, shortness of breath and wheezing.   Cardiovascular: Negative for chest pain and palpitations.       Bp elevated today.  Gastrointestinal: Negative for abdominal distention, abdominal pain, constipation, diarrhea, nausea and vomiting.  Endocrine: Negative for cold intolerance, heat intolerance, polydipsia, polyphagia and polyuria.  Genitourinary: Negative.  Negative for dysuria and frequency.  Musculoskeletal: Positive for arthralgias, back pain and myalgias. Negative for joint swelling and neck pain.  Skin: Negative for rash.  Allergic/Immunologic: Negative for environmental allergies.  Neurological: Positive for headaches. Negative for tremors and numbness.  Hematological: Negative for adenopathy.  Psychiatric/Behavioral: Positive for dysphoric mood and sleep disturbance. Negative for behavioral problems (Depression) and suicidal ideas. The patient is nervous/anxious.        Well controlled on current medication.    Today's Vitals   09/03/18 1002  BP: (!) 142/88  Pulse: 95  Resp: 16  SpO2: 94%  Weight: 171 lb (77.6 kg)  Height: 5' 1.5" (1.562 m)    Physical Exam  Constitutional: She is oriented to person, place, and time. She appears well-developed  and well-nourished.  HENT:  Head: Normocephalic and atraumatic.  Eyes: Pupils are equal, round, and reactive to light. EOM are normal.  Neck: Normal range of motion. Neck supple.  Cardiovascular: Normal rate, regular rhythm and normal heart sounds.  Pulmonary/Chest: Effort normal and breath sounds normal. She has no wheezes.  Musculoskeletal: Normal range of motion.  Chronic tendernes in left shoulder and arm as well as pain in both hands. ROM and strength are at their baseline in left arm. ROM and strength are intact in both hands.   Lymphadenopathy:    She has no cervical adenopathy.  Neurological: She is alert and oriented to person, place, and time.  Skin: Skin is warm and dry. Capillary refill takes 2  to 3 seconds.  Psychiatric: She has a normal mood and affect. Her behavior is normal. Judgment and thought content normal.  Nursing note and vitals reviewed.  Assessment/Plan: 1. Primary generalized (osteo)arthritis Continue to take meloxicam 15mg  daily to reduce pain and inflammation. May continue tramadol 50mg  up to twice daily when needed for breakthrough pain. A new prescription for #60 tablets sent to her pharmacy.  - traMADol (ULTRAM) 50 MG tablet; Take 2 tablets (100 mg total) by mouth every 12 (twelve) hours as needed for moderate pain.  Dispense: 120 tablet; Refill: 3  2. Essential hypertension Controlled with diet and exercise. Monitor closely.   General Counseling: Alexandria Murray verbalizes understanding of the findings of todays visit and agrees with plan of treatment. I have discussed any further diagnostic evaluation that may be needed or ordered today. We also reviewed her medications today. she has been encouraged to call the office with any questions or concerns that should arise related to todays visit.  Reviewed risks and possible side effects associated with taking opiates, benzodiazepines and other CNS depressants. Combination of these could cause dizziness and drowsiness.  Advised patient not to drive or operate machinery when taking these medications, as patient's and other's life can be at risk and will have consequences. Patient verbalized understanding in this matter. Dependence and abuse for these drugs will be monitored closely. A Controlled substance policy and procedure is on file which allows [facility] to order a urine drug screen test at any visit. Patient understands and agrees with the plan  This patient was seen by Vincent Gros FNP Collaboration with Dr Lyndon Code as a part of collaborative care agreement  Meds ordered this encounter  Medications  . traMADol (ULTRAM) 50 MG tablet    Sig: Take 2 tablets (100 mg total) by mouth every 12 (twelve) hours as needed for moderate pain.    Dispense:  120 tablet    Refill:  3    Order Specific Question:   Supervising Provider    Answer:   Lyndon Code [[id number]]    Time spent: 54 Minutes      Dr Lyndon Code Internal medicine

## 2018-10-04 ENCOUNTER — Ambulatory Visit (INDEPENDENT_AMBULATORY_CARE_PROVIDER_SITE_OTHER): Payer: Commercial Managed Care - PPO | Admitting: Nurse Practitioner

## 2018-10-04 ENCOUNTER — Encounter: Payer: Self-pay | Admitting: Nurse Practitioner

## 2018-10-04 VITALS — BP 150/102 | HR 87 | Resp 16 | Ht 61.0 in | Wt 165.6 lb

## 2018-10-04 DIAGNOSIS — R229 Localized swelling, mass and lump, unspecified: Secondary | ICD-10-CM | POA: Diagnosis not present

## 2018-10-04 DIAGNOSIS — F17210 Nicotine dependence, cigarettes, uncomplicated: Secondary | ICD-10-CM

## 2018-10-04 DIAGNOSIS — I10 Essential (primary) hypertension: Secondary | ICD-10-CM

## 2018-10-04 DIAGNOSIS — R300 Dysuria: Secondary | ICD-10-CM

## 2018-10-04 DIAGNOSIS — Z0001 Encounter for general adult medical examination with abnormal findings: Secondary | ICD-10-CM

## 2018-10-04 DIAGNOSIS — Z124 Encounter for screening for malignant neoplasm of cervix: Secondary | ICD-10-CM | POA: Insufficient documentation

## 2018-10-04 MED ORDER — LISINOPRIL 5 MG PO TABS: 5.0000 mg | ORAL_TABLET | Freq: Every day | ORAL | 2 refills | Status: DC

## 2018-10-04 NOTE — Progress Notes (Signed)
[facility] [address] [place], [place] [address]  Internal MEDICINE  Office Visit Note  Patient Name: Alexandria Murray  [id number] 29, 1977  [id number]  Date of Service: 10/04/2018  Chief Complaint  Patient presents with  . Annual Exam    4 month pysical   . Gynecologic Exam     Patient arrived to the clinic for routine wellness exam. She reports she is doing well. She has some sinus congestion and takes benadryl for symptoms relief. Patient's BP is elevated today. She reports she checked her blood pressure at home two weeks ago and it was in 130s/90s. Patient started on low dose of Lisinopril to lower her diastolic BP. Patient is educated about symptoms of hypotension. Patient also reports about soft bulge in the middle of her stomach. It is not painful and does not bother her, but she would like to know if it is hernia. Patient does not have anymore questions or concerns.   Pt is here for routine health maintenance examination  Current Medication: Outpatient Encounter Medications as of 10/04/2018  Medication Sig  . acetaminophen (TYLENOL) 500 MG tablet Take 1,000 mg by mouth every 6 (six) hours as needed for mild pain.  . Aspirin-Acetaminophen-Caffeine (PAMPRIN MAX PO) Take 2 tablets by mouth daily.  Marland Kitchen. docusate sodium (COLACE) 100 MG capsule Take 1 capsule (100 mg total) by mouth 2 (two) times daily.  Marland Kitchen. escitalopram (LEXAPRO) 10 MG tablet Take 1 tablet (10 mg total) by mouth daily.  . ferrous sulfate 325 (65 FE) MG tablet Take 1 tablet (325 mg total) by mouth daily.  . folic acid (FOLVITE) 1 MG tablet Take 1 tablet (1 mg total) by mouth daily.  Marland Kitchen. gabapentin (NEURONTIN) 100 MG capsule Take 100 mg by mouth 2 (two) times daily.  . meloxicam (MOBIC) 15 MG tablet Take 1 tablet (15 mg total) by mouth daily.  . Multiple Vitamin (MULTIVITAMIN WITH MINERALS) TABS tablet Take 1 tablet by mouth daily.  . Multiple Vitamins-Minerals (MULTIVITAMIN PO) Take 1 tablet by mouth daily.  .  ondansetron (ZOFRAN-ODT) 4 MG disintegrating tablet Take 1 tablet (4 mg total) by mouth every 8 (eight) hours as needed for nausea or vomiting.  . traMADol (ULTRAM) 50 MG tablet Take 2 tablets (100 mg total) by mouth every 12 (twelve) hours as needed for moderate pain.  . traZODone (DESYREL) 100 MG tablet Take 1 tablet (100 mg total) by mouth at bedtime as needed for sleep.  Marland Kitchen. lisinopril (PRINIVIL,ZESTRIL) 5 MG tablet Take 1 tablet (5 mg total) by mouth daily.   No facility-administered encounter medications on file as of 10/04/2018.     Surgical History: Past Surgical History:  Procedure Laterality Date  . CHOLECYSTECTOMY    . COLPOSCOPY  07/26/2015  . FINGER SURGERY      Medical History: Past Medical History:  Diagnosis Date  . Abnormal Pap smear of cervix 06/20/2015  . Anxiety   . 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 Guillain-Barre syndrome (HCC)   . Nicotine dependence   . Stroke (HCC)   . Torn rotator cuff     Family History: Family History  Problem Relation Age of Onset  . Cancer Mother   . Stroke Paternal Grandfather       Review of Systems  Constitutional: Negative for activity change, appetite change, chills, diaphoresis, fatigue, fever and unexpected weight change.  HENT: Positive for congestion and sinus pressure.  Negative for ear discharge, ear pain, facial swelling, hearing loss, mouth sores, postnasal drip, rhinorrhea, sinus pain, sore throat, trouble swallowing and voice change.   Respiratory: Negative for cough, choking, chest tightness, shortness of breath and wheezing.   Cardiovascular:       Elevated blood pressure.  Gastrointestinal: Negative for abdominal distention, abdominal pain, blood in stool, constipation, diarrhea, nausea and vomiting.       Pt report hemorrhoids and soft bulge in the middle of her abdomen  Endocrine: Negative for cold intolerance, heat  intolerance, polydipsia, polyphagia and polyuria.  Genitourinary: Positive for urgency. Negative for dysuria, flank pain, frequency, genital sores, pelvic pain, vaginal discharge and vaginal pain.       Urine urgency related to bladder prolapse   Musculoskeletal: Positive for arthralgias and neck pain.       Chronic left shoulder and arm pain.   Skin: Negative for color change, pallor, rash and wound.  Allergic/Immunologic: Positive for environmental allergies. Negative for food allergies.  Neurological: Positive for numbness. Negative for dizziness, tremors, seizures, syncope, facial asymmetry, speech difficulty, weakness, light-headedness and headaches.       Chronic mild intermittent numbness in the left arm  Hematological: Negative for adenopathy. Does not bruise/bleed easily.  Psychiatric/Behavioral: Negative for dysphoric mood.     Today's Vitals   10/04/18 0924  BP: (!) 150/102  Pulse: 87  Resp: 16  SpO2: 96%  Weight: 165 lb 9.6 oz (75.1 kg)  Height: 5\' 1"  (1.549 m)    Physical Exam  Constitutional: She is oriented to person, place, and time. She appears well-developed and well-nourished. No distress.  HENT:  Head: Normocephalic and atraumatic.  Right Ear: External ear normal.  Left Ear: External ear normal.  Nose: Nose normal.  Mouth/Throat: Oropharynx is clear and moist. No oropharyngeal exudate.  Bilateral wax impaction  Eyes: Pupils are equal, round, and reactive to light. Conjunctivae and EOM are normal. Right eye exhibits no discharge. Left eye exhibits no discharge. No scleral icterus.  Neck: Normal range of motion. No JVD present. No tracheal deviation present. No thyromegaly present.  Cardiovascular: Normal rate, regular rhythm, normal heart sounds and intact distal pulses. Exam reveals no gallop and no friction rub.  No murmur heard. Pulmonary/Chest: Effort normal and breath sounds normal. No stridor. No respiratory distress. She has no wheezes. She has no  rales. Right breast exhibits no inverted nipple, no nipple discharge, no skin change and no tenderness. Left breast exhibits no inverted nipple, no mass, no nipple discharge, no skin change and no tenderness.  Abdominal: Soft. Bowel sounds are normal. She exhibits mass.  Palpable mass in left, middle aspect of the abdomen. Non tender. Per patient, getting larger.   Genitourinary: Rectum normal, vagina normal and uterus normal. Cervix exhibits no motion tenderness. No vaginal discharge found.  Musculoskeletal: Normal range of motion. She exhibits no edema, tenderness or deformity.  Chronic tendernes in left shoulder and arm as well as pain in both hands. ROM and strength are at their baseline in left arm. ROM and strength are intact in both hands.    Lymphadenopathy:    She has no cervical adenopathy.  Neurological: She is alert and oriented to person, place, and time.  Skin: Skin is warm and dry. Capillary refill takes less than 2 seconds. No rash noted. She is not diaphoretic. No erythema. No pallor.  Psychiatric: She has a normal mood and affect. Her behavior is normal. Judgment and thought content normal.  Nursing note and vitals  reviewed.   Assessment/Plan: 1. Encounter for general adult medical examination with abnormal findings Annual health maintenance exam today.   2. Essential hypertension Start lisinopril 5mg  tablets daily. Advised her about DASH diet and importance of increasing water intake and exercise.  - lisinopril (PRINIVIL,ZESTRIL) 5 MG tablet; Take 1 tablet (5 mg total) by mouth daily.  Dispense: 30 tablet; Refill: 2  3. Local superficial swelling, mass or lump Soft, round, palpable mass in left, middle aspect of the abdomen. Will get ultrasound for further evaluation. Refer as indicated.  - US Abdomen Complete; Future  4. Screening for malignant neoplasm of cervix - Pap IG and HPV (high risk) DNA detection  5. Cigarette smoker  Smoking cessation discussed and  encouraged.   6. Dysuria - UA/M w/rflx Culture, Routine  General Counseling: Alexandria Murray verbalizes understanding of the findings of todays visit and agrees with plan of treatment. I have discussed any further diagnostic evaluation that may be needed or ordered today. We also reviewed her medications today. she has been encouraged to call the office with any questions or concerns that should arise related to todays visit.   Counseling: Smoking cessation counseling: 1. Pt acknowledges the risks of long term smoking, she will try to quite smoking. 2. Goal and date of reducing amount of cigarettes a day is discussed 3. Total time spent in smoking cessation is 15 min.  Hypertension Counseling:   The following hypertensive lifestyle modification were recommended and discussed:  1. Limiting alcohol intake to less than 1 oz/day of ethanol:(24 oz of beer or 8 oz of wine or 2 oz of 100-proof whiskey). 3. Importance of regular aerobic exercise and losing weight. 4. Reduce dietary saturated fat and cholesterol intake for overall cardiovascular health. 5. Maintaining adequate dietary potassium, calcium, and magnesium intake. 6. Regular monitoring of the blood pressure. 7. Reduce sodium intake to less than 100 mmol/day (less than 2.3 gm of sodium or less than 6 gm of sodium choride)   This patient was seen by Vincent Gros FNP Collaboration with Dr Lyndon Code as a part of collaborative care agreement  Orders Placed This Encounter  Procedures  . US Abdomen Complete  . UA/M w/rflx Culture, Routine    Meds ordered this encounter  Medications  . lisinopril (PRINIVIL,ZESTRIL) 5 MG tablet    Sig: Take 1 tablet (5 mg total) by mouth daily.    Dispense:  30 tablet    Refill:  2    Order Specific Question:   Supervising Provider    Answer:   Lyndon Code [[id number]]    Time spent: 38 Minutes      Lyndon Code, MD  Internal Medicine

## 2018-10-05 LAB — UA/M W/RFLX CULTURE, ROUTINE
Bilirubin, UA: NEGATIVE
Glucose, UA: NEGATIVE
Ketones, UA: NEGATIVE
Leukocytes, UA: NEGATIVE
Nitrite, UA: NEGATIVE
PH UA: 5.5 (ref 5.0–7.5)
Specific Gravity, UA: 1.021 (ref 1.005–1.030)
UUROB: 0.2 mg/dL (ref 0.2–1.0)

## 2018-10-05 LAB — MICROSCOPIC EXAMINATION
Casts: NONE SEEN /lpf
Epithelial Cells (non renal): >10 /hpf — AB (ref 0–10)
RBC, UA: >30 /hpf — AB (ref 0–2)

## 2018-10-06 LAB — PAP IG AND HPV HIGH-RISK: HPV, high-risk: NEGATIVE

## 2018-10-08 ENCOUNTER — Other Ambulatory Visit: Payer: Self-pay

## 2018-10-11 ENCOUNTER — Telehealth: Payer: Self-pay

## 2018-10-11 NOTE — Telephone Encounter (Signed)
Called pt and informed her to hold off on blood pressure medication per Herbert SetaHeather until further evaluation.

## 2018-10-11 NOTE — Telephone Encounter (Signed)
Have her hold the blood pressure medication and we will revaluate blood pressure at her next visit.

## 2018-11-16 ENCOUNTER — Encounter: Payer: Self-pay | Admitting: Nurse Practitioner

## 2018-11-16 ENCOUNTER — Ambulatory Visit: Payer: Commercial Managed Care - PPO | Admitting: Nurse Practitioner

## 2018-11-16 VITALS — BP 153/98 | HR 91 | Resp 16 | Ht 61.5 in | Wt 175.6 lb

## 2018-11-16 DIAGNOSIS — I10 Essential (primary) hypertension: Secondary | ICD-10-CM | POA: Diagnosis not present

## 2018-11-16 DIAGNOSIS — D509 Iron deficiency anemia, unspecified: Secondary | ICD-10-CM

## 2018-11-16 DIAGNOSIS — R229 Localized swelling, mass and lump, unspecified: Secondary | ICD-10-CM | POA: Diagnosis not present

## 2018-11-16 MED ORDER — LISINOPRIL 2.5 MG PO TABS: 5.0000 mg | ORAL_TABLET | Freq: Every day | ORAL | 3 refills | Status: DC

## 2018-11-16 NOTE — Progress Notes (Signed)
[facility] [address] [place], [place] [address]  Internal MEDICINE  Office Visit Note  Patient Name: Alexandria Murray  [id number] 23, 1977  [id number]  Date of Service: 11/16/2018  Chief Complaint  Patient presents with  . Medical Management of Chronic Issues    6wk follow up added lisinopril,   . Labs Only    lab results    The patient is here for routine follow up visit. Had been started on lisinopril 5mg  daily. Was lowering her blood pressure so much, that she was getting very dizzy. Once, blood pressure was taken at work and it was 80/60. She did finish her shift successfully, however, she did stop taking the lisinopril altogether. Blood pressure is still elevated today.  She had also been scheduled for abdominal ultrasound. States that she had gotten some typeof GI virus the night before. Was so severe she had to miss work. She slept through the appointment for her ultrasound and needs to have it rescheduled.  Needs to have labs drawn, ordered at her most recent visit. She will also need to have pap smear redone. Pap was not able to be analyzed. Report stating there was insufficient cellularity to analyze. Her HPV was negative.       Current Medication: Outpatient Encounter Medications as of 11/16/2018  Medication Sig  . acetaminophen (TYLENOL) 500 MG tablet Take 1,000 mg by mouth every 6 (six) hours as needed for mild pain.  . Aspirin-Acetaminophen-Caffeine (PAMPRIN MAX PO) Take 2 tablets by mouth daily.  Marland Kitchen. docusate sodium (COLACE) 100 MG capsule Take 1 capsule (100 mg total) by mouth 2 (two) times daily.  Marland Kitchen. escitalopram (LEXAPRO) 10 MG tablet Take 1 tablet (10 mg total) by mouth daily.  . ferrous sulfate 325 (65 FE) MG tablet Take 1 tablet (325 mg total) by mouth daily.  . folic acid (FOLVITE) 1 MG tablet Take 1 tablet (1 mg total) by mouth daily.  Marland Kitchen. gabapentin (NEURONTIN) 100 MG capsule Take 100 mg by mouth 2 (two) times daily.  Marland Kitchen. lisinopril (PRINIVIL,ZESTRIL) 2.5 MG  tablet Take 2 tablets (5 mg total) by mouth daily.  . meloxicam (MOBIC) 15 MG tablet Take 1 tablet (15 mg total) by mouth daily.  . Multiple Vitamin (MULTIVITAMIN WITH MINERALS) TABS tablet Take 1 tablet by mouth daily.  . Multiple Vitamins-Minerals (MULTIVITAMIN PO) Take 1 tablet by mouth daily.  . ondansetron (ZOFRAN-ODT) 4 MG disintegrating tablet Take 1 tablet (4 mg total) by mouth every 8 (eight) hours as needed for nausea or vomiting.  . traMADol (ULTRAM) 50 MG tablet Take 2 tablets (100 mg total) by mouth every 12 (twelve) hours as needed for moderate pain.  . traZODone (DESYREL) 100 MG tablet Take 1 tablet (100 mg total) by mouth at bedtime as needed for sleep.  . [DISCONTINUED] lisinopril (PRINIVIL,ZESTRIL) 5 MG tablet Take 1 tablet (5 mg total) by mouth daily.   No facility-administered encounter medications on file as of 11/16/2018.     Surgical History: Past Surgical History:  Procedure Laterality Date  . CHOLECYSTECTOMY    . COLPOSCOPY  07/26/2015  . FINGER SURGERY      Medical History: Past Medical History:  Diagnosis Date  . Abnormal Pap smear of cervix 06/20/2015  . Anxiety   . 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 Guillain-Barre syndrome (HCC)   . Nicotine dependence   . Stroke (HCC)   . Torn rotator  cuff     Family History: Family History  Problem Relation Age of Onset  . Cancer Mother   . Stroke Paternal Grandfather     Social History   Socioeconomic History  . Marital status: Married    Spouse name: Not on file  . Number of children: Not on file  . Years of education: Not on file  . Highest education level: Not on file  Occupational History  . Not on file  Social Needs  . Financial resource strain: Not on file  . Food insecurity:    Worry: Not on file    Inability: Not on file  . Transportation needs:    Medical: Not on file    Non-medical: Not on file   Tobacco Use  . Smoking status: Current Every Day Smoker    Packs/day: 2.00    Types: Cigarettes  . Smokeless tobacco: Never Used  Substance and Sexual Activity  . Alcohol use: No  . Drug use: No  . Sexual activity: Yes    Birth control/protection: Post-menopausal  Lifestyle  . Physical activity:    Days per week: Not on file    Minutes per session: Not on file  . Stress: Not on file  Relationships  . Social connections:    Talks on phone: Not on file    Gets together: Not on file    Attends religious service: Not on file    Active member of club or organization: Not on file    Attends meetings of clubs or organizations: Not on file    Relationship status: Not on file  . Intimate partner violence:    Fear of current or ex partner: Not on file    Emotionally abused: Not on file    Physically abused: Not on file    Forced sexual activity: Not on file  Other Topics Concern  . Not on file  Social History Narrative  . Not on file      Review of Systems  Constitutional: Negative for activity change, appetite change, chills, diaphoresis, fatigue, fever and unexpected weight change.  HENT: Negative for congestion, ear discharge, ear pain, facial swelling, hearing loss, mouth sores, postnasal drip, rhinorrhea, sinus pressure, sinus pain, sore throat, trouble swallowing and voice change.   Respiratory: Negative for cough, choking, chest tightness, shortness of breath and wheezing.   Cardiovascular: Negative for chest pain and palpitations.       Elevated blood pressure.  Gastrointestinal: Negative for abdominal distention, abdominal pain, blood in stool, constipation, diarrhea, nausea and vomiting.       Pt report hemorrhoids and soft bulge in the middle of her abdomen  Endocrine: Negative for cold intolerance, heat intolerance, polydipsia and polyuria.  Genitourinary: Negative for dysuria, flank pain, frequency, genital sores, pelvic pain, urgency, vaginal discharge and vaginal  pain.       Urine urgency related to bladder prolapse   Musculoskeletal: Positive for arthralgias and neck pain.       Chronic left shoulder and arm pain.   Skin: Negative for color change, pallor, rash and wound.  Allergic/Immunologic: Positive for environmental allergies. Negative for food allergies.  Neurological: Positive for numbness. Negative for dizziness, tremors, seizures, syncope, facial asymmetry, speech difficulty, weakness, light-headedness and headaches.       Chronic mild intermittent numbness in the left arm  Hematological: Negative for adenopathy. Does not bruise/bleed easily.  Psychiatric/Behavioral: Negative for dysphoric mood.    Today's Vitals   11/16/18 0840  BP: (!) 153/98  Pulse: 91  Resp: 16  SpO2: 94%  Weight: 175 lb 9.6 oz (79.7 kg)  Height: 5' 1.5" (1.562 m)    Physical Exam Vitals signs and nursing note reviewed.  Constitutional:      General: She is not in acute distress.    Appearance: Normal appearance. She is well-developed. She is not diaphoretic.  HENT:     Head: Normocephalic and atraumatic.     Nose: Nose normal.     Mouth/Throat:     Pharynx: No oropharyngeal exudate.  Eyes:     General: No scleral icterus.       Right eye: No discharge.        Left eye: No discharge.     Conjunctiva/sclera: Conjunctivae normal.     Pupils: Pupils are equal, round, and reactive to light.  Neck:     Musculoskeletal: Normal range of motion and neck supple.     Thyroid: No thyromegaly.     Vascular: No JVD.     Trachea: No tracheal deviation.  Cardiovascular:     Rate and Rhythm: Normal rate and regular rhythm.     Heart sounds: Normal heart sounds. No murmur. No friction rub. No gallop.   Pulmonary:     Effort: Pulmonary effort is normal. No respiratory distress.     Breath sounds: Normal breath sounds. No stridor. No wheezing or rales.  Chest:     Breasts:        Right: No inverted nipple, nipple discharge, skin change or tenderness.         Left: No inverted nipple, mass, nipple discharge, skin change or tenderness.  Abdominal:     General: Bowel sounds are normal.     Palpations: Abdomen is soft. There is mass.     Comments: Palpable mass in left, middle aspect of the abdomen. Non tender. Per patient, getting larger.   Genitourinary:    Vagina: No vaginal discharge.     Cervix: No cervical motion tenderness.  Musculoskeletal: Normal range of motion.        General: No tenderness or deformity.     Comments: Chronic tendernes in left shoulder and arm as well as pain in both hands. ROM and strength are at their baseline in left arm. ROM and strength are intact in both hands.    Lymphadenopathy:     Cervical: No cervical adenopathy.  Skin:    General: Skin is warm and dry.     Capillary Refill: Capillary refill takes less than 2 seconds.     Coloration: Skin is not pale.     Findings: No erythema or rash.  Neurological:     Mental Status: She is alert and oriented to person, place, and time.  Psychiatric:        Behavior: Behavior normal.        Thought Content: Thought content normal.        Judgment: Judgment normal.    Assessment/Plan:  1. Essential hypertension Reduce dose of lisinopril to 2.5mg  daily. Reviewed DASH diet and importance of regular physical activity in daily routine. - lisinopril (PRINIVIL,ZESTRIL) 2.5 MG tablet; Take 2 tablets (5 mg total) by mouth daily.  Dispense: 30 tablet; Refill: 3  2. Local superficial swelling, mass or lump Reschedule missed abdominal ultrasound for further evaluation.  3. Iron deficiency anemia, unspecified iron deficiency anemia type Continue iron supplementation. Check labs and adjust dosing as indicated.   General Counseling: Taquana verbalizes understanding of the findings of todays visit and agrees  with plan of treatment. I have discussed any further diagnostic evaluation that may be needed or ordered today. We also reviewed her medications today. she has been encouraged  to call the office with any questions or concerns that should arise related to todays visit.    Hypertension Counseling:   The following hypertensive lifestyle modification were recommended and discussed:  1. Limiting alcohol intake to less than 1 oz/day of ethanol:(24 oz of beer or 8 oz of wine or 2 oz of 100-proof whiskey). 2. Take baby ASA 81 mg daily. 3. Importance of regular aerobic exercise and losing weight. 4. Reduce dietary saturated fat and cholesterol intake for overall cardiovascular health. 5. Maintaining adequate dietary potassium, calcium, and magnesium intake. 6. Regular monitoring of the blood pressure. 7. Reduce sodium intake to less than 100 mmol/day (less than 2.3 gm of sodium or less than 6 gm of sodium choride)   This patient was seen by Vincent Gros FNP Collaboration with Dr Lyndon Code as a part of collaborative care agreement  Meds ordered this encounter  Medications  . lisinopril (PRINIVIL,ZESTRIL) 2.5 MG tablet    Sig: Take 2 tablets (5 mg total) by mouth daily.    Dispense:  30 tablet    Refill:  3    Please note decreased dose    Order Specific Question:   Supervising Provider    Answer:   Lyndon Code [[id number]]    Time spent: 25 Minutes      Dr Lyndon Code Internal medicine

## 2018-11-24 ENCOUNTER — Other Ambulatory Visit: Payer: Self-pay | Admitting: Nurse Practitioner

## 2018-11-24 DIAGNOSIS — M150 Primary generalized (osteo)arthritis: Secondary | ICD-10-CM

## 2018-11-24 MED ORDER — TRAMADOL HCL 50 MG PO TABS: 100.0000 mg | ORAL_TABLET | Freq: Two times a day (BID) | ORAL | 1 refills | Status: DC | PRN

## 2018-11-24 NOTE — Progress Notes (Signed)
Refilled current prescription for tramadol 100mg  bid prn per pharmacy request.

## 2018-12-10 ENCOUNTER — Ambulatory Visit: Payer: Commercial Managed Care - PPO

## 2018-12-10 DIAGNOSIS — R229 Localized swelling, mass and lump, unspecified: Secondary | ICD-10-CM

## 2018-12-31 ENCOUNTER — Other Ambulatory Visit: Payer: Self-pay | Admitting: Nurse Practitioner

## 2018-12-31 DIAGNOSIS — E559 Vitamin D deficiency, unspecified: Secondary | ICD-10-CM | POA: Diagnosis not present

## 2018-12-31 DIAGNOSIS — I10 Essential (primary) hypertension: Secondary | ICD-10-CM | POA: Diagnosis not present

## 2018-12-31 DIAGNOSIS — Z0001 Encounter for general adult medical examination with abnormal findings: Secondary | ICD-10-CM | POA: Diagnosis not present

## 2019-01-01 LAB — COMPREHENSIVE METABOLIC PANEL
ALK PHOS: 99 IU/L (ref 39–117)
ALT: 11 IU/L (ref 0–32)
AST: 15 IU/L (ref 0–40)
Albumin/Globulin Ratio: 1.9 (ref 1.2–2.2)
Albumin: 4.5 g/dL (ref 3.8–4.8)
BUN/Creatinine Ratio: 23 (ref 9–23)
BUN: 19 mg/dL (ref 6–24)
Bilirubin Total: 0.3 mg/dL (ref 0.0–1.2)
CO2: 21 mmol/L (ref 20–29)
CREATININE: 0.83 mg/dL (ref 0.57–1.00)
Calcium: 9.5 mg/dL (ref 8.7–10.2)
Chloride: 97 mmol/L (ref 96–106)
GFR calc Af Amer: 99 mL/min/{1.73_m2} (ref >59)
GFR calc non Af Amer: 86 mL/min/{1.73_m2} (ref >59)
Globulin, Total: 2.4 g/dL (ref 1.5–4.5)
Glucose: 89 mg/dL (ref 65–99)
Potassium: 4.2 mmol/L (ref 3.5–5.2)
Sodium: 139 mmol/L (ref 134–144)
Total Protein: 6.9 g/dL (ref 6.0–8.5)

## 2019-01-01 LAB — T4, FREE: Free T4: 1.06 ng/dL (ref 0.82–1.77)

## 2019-01-01 LAB — LIPID PANEL W/O CHOL/HDL RATIO
Cholesterol, Total: 212 mg/dL — ABNORMAL HIGH (ref 100–199)
HDL: 40 mg/dL (ref >39)
LDL Calculated: 133 mg/dL — ABNORMAL HIGH (ref 0–99)
Triglycerides: 197 mg/dL — ABNORMAL HIGH (ref 0–149)
VLDL Cholesterol Cal: 39 mg/dL (ref 5–40)

## 2019-01-01 LAB — CBC
Hematocrit: 43.4 % (ref 34.0–46.6)
Hemoglobin: 15.4 g/dL (ref 11.1–15.9)
MCH: 31.6 pg (ref 26.6–33.0)
MCHC: 35.5 g/dL (ref 31.5–35.7)
MCV: 89 fL (ref 79–97)
Platelets: 197 10*3/uL (ref 150–450)
RBC: 4.88 x10E6/uL (ref 3.77–5.28)
RDW: 13.2 % (ref 11.7–15.4)
WBC: 7.6 10*3/uL (ref 3.4–10.8)

## 2019-01-01 LAB — T3: T3 TOTAL: 119 ng/dL (ref 71–180)

## 2019-01-01 LAB — VITAMIN D 25 HYDROXY (VIT D DEFICIENCY, FRACTURES): VIT D 25 HYDROXY: 12.7 ng/mL — AB (ref 30.0–100.0)

## 2019-01-01 LAB — TSH: TSH: 1.29 u[IU]/mL (ref 0.450–4.500)

## 2019-01-05 ENCOUNTER — Other Ambulatory Visit: Payer: Self-pay | Admitting: Nurse Practitioner

## 2019-01-05 DIAGNOSIS — E559 Vitamin D deficiency, unspecified: Secondary | ICD-10-CM

## 2019-01-05 MED ORDER — ERGOCALCIFEROL 1.25 MG (50000 UT) PO CAPS: 50000.0000 [IU] | ORAL_CAPSULE | ORAL | 5 refills | Status: DC

## 2019-01-05 NOTE — Progress Notes (Signed)
Vitamin d low on labs. Start drisdol 50000iu weekly. New prescription sent to [facility] pharmacy. Other labs were good.

## 2019-01-10 ENCOUNTER — Ambulatory Visit: Payer: Commercial Managed Care - PPO | Admitting: Nurse Practitioner

## 2019-01-10 ENCOUNTER — Other Ambulatory Visit: Payer: Self-pay

## 2019-01-10 ENCOUNTER — Encounter: Payer: Self-pay | Admitting: Nurse Practitioner

## 2019-01-10 VITALS — BP 126/92 | HR 86 | Resp 16 | Ht 61.5 in | Wt 168.4 lb

## 2019-01-10 DIAGNOSIS — R110 Nausea: Secondary | ICD-10-CM

## 2019-01-10 DIAGNOSIS — M150 Primary generalized (osteo)arthritis: Secondary | ICD-10-CM | POA: Diagnosis not present

## 2019-01-10 DIAGNOSIS — Z124 Encounter for screening for malignant neoplasm of cervix: Secondary | ICD-10-CM

## 2019-01-10 DIAGNOSIS — E782 Mixed hyperlipidemia: Secondary | ICD-10-CM | POA: Diagnosis not present

## 2019-01-10 DIAGNOSIS — I10 Essential (primary) hypertension: Secondary | ICD-10-CM

## 2019-01-10 MED ORDER — ONDANSETRON 4 MG PO TBDP: 4.0000 mg | ORAL_TABLET | Freq: Three times a day (TID) | ORAL | 3 refills | Status: DC | PRN

## 2019-01-10 MED ORDER — EZETIMIBE 10 MG PO TABS: 10.0000 mg | ORAL_TABLET | Freq: Every day | ORAL | 3 refills | Status: DC

## 2019-01-10 MED ORDER — TRAMADOL HCL 50 MG PO TABS: 100.0000 mg | ORAL_TABLET | Freq: Two times a day (BID) | ORAL | 2 refills | Status: DC | PRN

## 2019-01-10 NOTE — Progress Notes (Signed)
Pt blood pressure elevated, pt works third shift.  126/92 first reading Informed provider of blood pressure.

## 2019-01-10 NOTE — Progress Notes (Signed)
[facility] [address] [place], [place] [address]  Internal MEDICINE  Office Visit Note  Patient Name: Alexandria Murray  [id number]  [id number]  Date of Service: 01/10/2019   Pt is here for a pap smear   Chief Complaint  Patient presents with  . Medical Management of Chronic Issues  . Gynecologic Exam     The patient is here for repeat pap smear. Has history of abnormal pap smear. Last one done 09/2018 was unsatisfactory for evaluation, however, HPV was performed and was negative.  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 She takes meloxicam to treat pain and inflammation and tramadol as needed for more severe pain.  She finds that, recently, she is only having to take the tramadol once before work. Overall, she is doing better with pain control. She needs to have refills for this.  She did have ultrasound of the abdomen since her last visit. Was positive for 4cm umbilical hernia. This is not painful and has not changed. Labs were also done, and vitamin d was low and lipid panel was mildly elevated, generally.      Current Medication: Outpatient Encounter Medications as of 01/10/2019  Medication Sig  . acetaminophen (TYLENOL) 500 MG tablet Take 1,000 mg by mouth every 6 (six) hours as needed for mild pain.  . Aspirin-Acetaminophen-Caffeine (PAMPRIN MAX PO) Take 2 tablets by mouth daily.  Marland Kitchen docusate sodium (COLACE) 100 MG capsule Take 1 capsule (100 mg total) by mouth 2 (two) times daily.  . ergocalciferol (DRISDOL) 1.25 MG (50000 UT) capsule Take 1 capsule (50,000 Units total) by mouth once a week.  . escitalopram (LEXAPRO) 10 MG tablet Take 1 tablet (10 mg total) by mouth daily.  . ferrous sulfate 325 (65 FE) MG tablet Take 1 tablet (325 mg total) by mouth daily.  . folic acid (FOLVITE) 1 MG tablet Take 1 tablet (1 mg total) by mouth daily.  Marland Kitchen gabapentin (NEURONTIN) 100 MG capsule Take 100 mg by mouth 2 (two) times  daily.  Marland Kitchen lisinopril (PRINIVIL,ZESTRIL) 2.5 MG tablet Take 2 tablets (5 mg total) by mouth daily.  . meloxicam (MOBIC) 15 MG tablet Take 1 tablet (15 mg total) by mouth daily.  . Multiple Vitamin (MULTIVITAMIN WITH MINERALS) TABS tablet Take 1 tablet by mouth daily.  . Multiple Vitamins-Minerals (MULTIVITAMIN PO) Take 1 tablet by mouth daily.  . ondansetron (ZOFRAN-ODT) 4 MG disintegrating tablet Take 1 tablet (4 mg total) by mouth every 8 (eight) hours as needed for nausea or vomiting.  . traMADol (ULTRAM) 50 MG tablet Take 2 tablets (100 mg total) by mouth every 12 (twelve) hours as needed for moderate pain.  . traZODone (DESYREL) 100 MG tablet Take 1 tablet (100 mg total) by mouth at bedtime as needed for sleep.  . [DISCONTINUED] ondansetron (ZOFRAN-ODT) 4 MG disintegrating tablet Take 1 tablet (4 mg total) by mouth every 8 (eight) hours as needed for nausea or vomiting.  . [DISCONTINUED] traMADol (ULTRAM) 50 MG tablet Take 2 tablets (100 mg total) by mouth every 12 (twelve) hours as needed for moderate pain.  Marland Kitchen ezetimibe (ZETIA) 10 MG tablet Take 1 tablet (10 mg total) by mouth daily.   No facility-administered encounter medications on file as of 01/10/2019.     Surgical History: Past Surgical History:  Procedure Laterality Date  . CHOLECYSTECTOMY    . COLPOSCOPY  07/26/2015  . FINGER SURGERY      Medical History: Past Medical  History:  Diagnosis Date  . Abnormal Pap smear of cervix 06/20/2015  . Anxiety   . 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 Guillain-Barre syndrome (HCC)   . Nicotine dependence   . Stroke (HCC)   . Torn rotator cuff     Family History: Family History  Problem Relation Age of Onset  . Cancer Mother   . Stroke Paternal Grandfather     Social History   Socioeconomic History  . Marital status: Married    Spouse name: Not on file  . Number of children: Not on file  .  Years of education: Not on file  . Highest education level: Not on file  Occupational History  . Not on file  Social Needs  . Financial resource strain: Not on file  . Food insecurity:    Worry: Not on file    Inability: Not on file  . Transportation needs:    Medical: Not on file    Non-medical: Not on file  Tobacco Use  . Smoking status: Current Every Day Smoker    Packs/day: 2.00    Types: Cigarettes  . Smokeless tobacco: Never Used  Substance and Sexual Activity  . Alcohol use: No  . Drug use: No  . Sexual activity: Yes    Birth control/protection: Post-menopausal  Lifestyle  . Physical activity:    Days per week: Not on file    Minutes per session: Not on file  . Stress: Not on file  Relationships  . Social connections:    Talks on phone: Not on file    Gets together: Not on file    Attends religious service: Not on file    Active member of club or organization: Not on file    Attends meetings of clubs or organizations: Not on file    Relationship status: Not on file  . Intimate partner violence:    Fear of current or ex partner: Not on file    Emotionally abused: Not on file    Physically abused: Not on file    Forced sexual activity: Not on file  Other Topics Concern  . Not on file  Social History Narrative  . Not on file     Review of Systems  Constitutional: Negative for activity change, appetite change, chills, diaphoresis, fatigue, fever and unexpected weight change.  HENT: Negative for congestion, ear discharge, ear pain, facial swelling, hearing loss, mouth sores, postnasal drip, rhinorrhea, sinus pressure, sinus pain, sore throat, trouble swallowing and voice change.   Respiratory: Negative for cough, choking, chest tightness, shortness of breath and wheezing.   Cardiovascular: Negative for chest pain and palpitations.       Improved blood pressure.   Gastrointestinal: Negative for abdominal distention, abdominal pain, blood in stool, constipation,  diarrhea, nausea and vomiting.       Soft bulge in the middle of her abdomen. Ultrasound did show 4cm umbilical hernia with some loops of bowel which protrude through the hernia. Non painful.   Endocrine: Negative for cold intolerance, heat intolerance, polydipsia and polyuria.  Genitourinary: Negative for dysuria, flank pain, frequency, genital sores, pelvic pain, urgency, vaginal discharge and vaginal pain.       History of abnormal pap smear and genital warts.    Musculoskeletal: Positive for arthralgias and neck pain.       Chronic left shoulder and arm pain.   Skin: Negative for color change,  pallor, rash and wound.  Allergic/Immunologic: Positive for environmental allergies. Negative for food allergies.  Neurological: Positive for numbness. Negative for dizziness, tremors, seizures, syncope, facial asymmetry, speech difficulty, weakness, light-headedness and headaches.       Chronic mild intermittent numbness in the left arm  Hematological: Negative for adenopathy. Does not bruise/bleed easily.  Psychiatric/Behavioral: Negative for dysphoric mood.     Today's Vitals   01/10/19 1006  BP: (!) 126/92  Pulse: 86  Resp: 16  SpO2: 96%  Weight: 168 lb 6.4 oz (76.4 kg)  Height: 5' 1.5" (1.562 m)   Body mass index is 31.3 kg/m.  Physical Exam Vitals signs and nursing note reviewed.  Constitutional:      General: She is not in acute distress.    Appearance: Normal appearance. She is well-developed. She is not diaphoretic.  HENT:     Head: Normocephalic and atraumatic.     Nose: Nose normal.     Mouth/Throat:     Pharynx: No oropharyngeal exudate.  Eyes:     General: No scleral icterus.       Right eye: No discharge.        Left eye: No discharge.     Conjunctiva/sclera: Conjunctivae normal.     Pupils: Pupils are equal, round, and reactive to light.  Neck:     Musculoskeletal: Normal range of motion and neck supple.     Thyroid: No thyromegaly.     Vascular: No JVD.      Trachea: No tracheal deviation.  Cardiovascular:     Rate and Rhythm: Normal rate and regular rhythm.     Heart sounds: Normal heart sounds. No murmur. No friction rub. No gallop.   Pulmonary:     Effort: Pulmonary effort is normal. No respiratory distress.     Breath sounds: Normal breath sounds. No stridor. No wheezing or rales.  Chest:     Breasts:        Right: No inverted nipple, nipple discharge, skin change or tenderness.        Left: No inverted nipple, mass, nipple discharge, skin change or tenderness.  Abdominal:     General: Bowel sounds are normal.     Palpations: Abdomen is soft. There is mass.     Comments: Palpable mass in left, middle aspect of the abdomen. Non tender. Per patient, getting larger.   Genitourinary:    Exam position: Supine.     Vagina: Normal. No vaginal discharge, erythema, tenderness or bleeding.     Cervix: No cervical motion tenderness, discharge, friability or erythema.     Uterus: Normal.      Adnexa: Right adnexa normal and left adnexa normal.     Comments: No tenderness, masses, or organomeglay present during bimanual exam . Musculoskeletal: Normal range of motion.        General: No tenderness or deformity.     Comments: Chronic tendernes in left shoulder and arm as well as pain in both hands. ROM and strength are at their baseline in left arm. ROM and strength are intact in both hands.    Lymphadenopathy:     Cervical: No cervical adenopathy.  Skin:    General: Skin is warm and dry.     Capillary Refill: Capillary refill takes less than 2 seconds.     Coloration: Skin is not pale.     Findings: No erythema or rash.  Neurological:     Mental Status: She is alert and oriented to person, place, and time.  Psychiatric:        Behavior: Behavior normal.        Thought Content: Thought content normal.        Judgment: Judgment normal.    Assessment/Plan: 1. Essential hypertension Stable. Continue bp medication as prescribed   2. Mixed  hyperlipidemia Will do trial of zetia  daily with supper, as she has been unable to tolerate statins in the past.  - ezetimibe (ZETIA) 10 MG tablet; Take 1 tablet (10 mg total) by mouth daily.  Dispense: 30 tablet; Refill: 3  3. Primary generalized (osteo)arthritis May continue tramadol  twice daily if needed for pain. New prescription sent to her pharmacy.  - traMADol (ULTRAM) 50 MG tablet; Take 2 tablets (100 mg total) by mouth every 12 (twelve) hours as needed for moderate pain.  Dispense: 120 tablet; Refill: 2  4. Nausea - ondansetron (ZOFRAN-ODT) 4 MG disintegrating tablet; Take 1 tablet (4 mg total) by mouth every 8 (eight) hours as needed for nausea or vomiting.  Dispense: 30 tablet; Refill: 3  5. Routine cervical smear - Pap IG and HPV (high risk) DNA detection  General Counseling: Brandi verbalizes understanding of the findings of todays visit and agrees with plan of treatment. I have discussed any further diagnostic evaluation that may be needed or ordered today. We also reviewed her medications today. she has been encouraged to call the office with any questions or concerns that should arise related to todays visit.    Counseling:   Reviewed risks and possible side effects associated with taking opiates, benzodiazepines and other CNS depressants. Combination of these could cause dizziness and drowsiness. Advised patient not to drive or operate machinery when taking these medications, as patient's and other's life can be at risk and will have consequences. Patient verbalized understanding in this matter. Dependence and abuse for these drugs will be monitored closely. A Controlled substance policy and procedure is on file which allows [facility] to order a urine drug screen test at any visit. Patient understands and agrees with the plan  This patient was seen by Vincent Gros FNP Collaboration with Dr Lyndon Code as a part of collaborative care agreement  Meds  ordered this encounter  Medications  . ezetimibe (ZETIA) 10 MG tablet    Sig: Take 1 tablet (10 mg total) by mouth daily.    Dispense:  30 tablet    Refill:  3    High cholesterol and history of stroke. Statins have caused excess joint and muscle pain.    Order Specific Question:   Supervising Provider    Answer:   Lyndon Code [[id number]]  . traMADol (ULTRAM) 50 MG tablet    Sig: Take 2 tablets (100 mg total) by mouth every 12 (twelve) hours as needed for moderate pain.    Dispense:  120 tablet    Refill:  2    Order Specific Question:   Supervising Provider    Answer:   Lyndon Code [[id number]]  . ondansetron (ZOFRAN-ODT) 4 MG disintegrating tablet    Sig: Take 1 tablet (4 mg total) by mouth every 8 (eight) hours as needed for nausea or vomiting.    Dispense:  30 tablet    Refill:  3    Order Specific Question:   Supervising Provider    Answer:   Lyndon Code [[id number]]    Time spent: 30 Minutes

## 2019-01-13 LAB — PAP IG AND HPV HIGH-RISK: HPV, high-risk: NEGATIVE

## 2019-04-14 ENCOUNTER — Other Ambulatory Visit: Payer: Self-pay

## 2019-04-14 ENCOUNTER — Encounter: Payer: Self-pay | Admitting: Nurse Practitioner

## 2019-04-14 ENCOUNTER — Ambulatory Visit: Payer: Commercial Managed Care - PPO | Admitting: Nurse Practitioner

## 2019-04-14 VITALS — BP 132/90 | HR 86 | Resp 16 | Ht 60.75 in | Wt 173.6 lb

## 2019-04-14 DIAGNOSIS — E782 Mixed hyperlipidemia: Secondary | ICD-10-CM

## 2019-04-14 DIAGNOSIS — I10 Essential (primary) hypertension: Secondary | ICD-10-CM | POA: Diagnosis not present

## 2019-04-14 DIAGNOSIS — F5101 Primary insomnia: Secondary | ICD-10-CM

## 2019-04-14 DIAGNOSIS — F321 Major depressive disorder, single episode, moderate: Secondary | ICD-10-CM | POA: Diagnosis not present

## 2019-04-14 DIAGNOSIS — D509 Iron deficiency anemia, unspecified: Secondary | ICD-10-CM

## 2019-04-14 DIAGNOSIS — M150 Primary generalized (osteo)arthritis: Secondary | ICD-10-CM | POA: Diagnosis not present

## 2019-04-14 DIAGNOSIS — G610 Guillain-Barre syndrome: Secondary | ICD-10-CM

## 2019-04-14 MED ORDER — MELOXICAM 15 MG PO TABS: 15.0000 mg | ORAL_TABLET | Freq: Every day | ORAL | 3 refills | Status: DC

## 2019-04-14 MED ORDER — FOLIC ACID 1 MG PO TABS: 1.0000 mg | ORAL_TABLET | Freq: Every day | ORAL | 3 refills | Status: DC

## 2019-04-14 MED ORDER — TRAMADOL HCL 50 MG PO TABS: 100.0000 mg | ORAL_TABLET | Freq: Two times a day (BID) | ORAL | 2 refills | Status: DC | PRN

## 2019-04-14 MED ORDER — EZETIMIBE 10 MG PO TABS: 10.0000 mg | ORAL_TABLET | Freq: Every day | ORAL | 3 refills | Status: DC

## 2019-04-14 MED ORDER — LISINOPRIL 2.5 MG PO TABS: 5.0000 mg | ORAL_TABLET | Freq: Every day | ORAL | 3 refills | Status: DC

## 2019-04-14 MED ORDER — GABAPENTIN 100 MG PO CAPS: 100.0000 mg | ORAL_CAPSULE | Freq: Two times a day (BID) | ORAL | 11 refills | Status: DC

## 2019-04-14 MED ORDER — TRAZODONE HCL 100 MG PO TABS: 100.0000 mg | ORAL_TABLET | Freq: Every evening | ORAL | 3 refills | Status: DC | PRN

## 2019-04-14 MED ORDER — ESCITALOPRAM OXALATE 10 MG PO TABS: 10.0000 mg | ORAL_TABLET | Freq: Every day | ORAL | 3 refills | Status: DC

## 2019-04-14 NOTE — Progress Notes (Signed)
[facility] [place], [place] [address]  Internal MEDICINE  Office Visit Note  Patient Name: Alexandria Murray  [id number]  [id number]  Date of Service: 04/17/2019  Chief Complaint  Patient presents with  . 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 She takes meloxicam to treat pain and inflammation and tramadol as needed for more severe pain.  She finds that, recently, she is only having to take the tramadol once before work. Overall, she is doing better with pain control. She needs to have refills for this. Blood pressure is doing very well on low dose lisinopril. She has been moved to day shift and likes this so much better than working nights.          Current Medication: Outpatient Encounter Medications as of 04/14/2019  Medication Sig  . acetaminophen (TYLENOL) 500 MG tablet Take 1,000 mg by mouth every 6 (six) hours as needed for mild pain.  . Aspirin-Acetaminophen-Caffeine (PAMPRIN MAX PO) Take 2 tablets by mouth daily.  Marland Kitchen docusate sodium (COLACE) 100 MG capsule Take 1 capsule (100 mg total) by mouth 2 (two) times daily.  . ergocalciferol (DRISDOL) 1.25 MG (50000 UT) capsule Take 1 capsule (50,000 Units total) by mouth once a week.  . escitalopram (LEXAPRO) 10 MG tablet Take 1 tablet (10 mg total) by mouth daily.  Marland Kitchen ezetimibe (ZETIA) 10 MG tablet Take 1 tablet (10 mg total) by mouth daily.  . ferrous sulfate 325 (65 FE) MG tablet Take 1 tablet (325 mg total) by mouth daily.  . folic acid (FOLVITE) 1 MG tablet Take 1 tablet (1 mg total) by mouth daily.  Marland Kitchen gabapentin (NEURONTIN) 100 MG capsule Take 1 capsule (100 mg total) by mouth 2 (two) times daily.  Marland Kitchen lisinopril (ZESTRIL) 2.5 MG tablet Take 2 tablets (5 mg total) by mouth daily.  . meloxicam (MOBIC) 15 MG tablet Take 1 tablet (15 mg total) by mouth daily.  .  Multiple Vitamin (MULTIVITAMIN WITH MINERALS) TABS tablet Take 1 tablet by mouth daily.  . Multiple Vitamins-Minerals (MULTIVITAMIN PO) Take 1 tablet by mouth daily.  . ondansetron (ZOFRAN-ODT) 4 MG disintegrating tablet Take 1 tablet (4 mg total) by mouth every 8 (eight) hours as needed for nausea or vomiting.  . traMADol (ULTRAM) 50 MG tablet Take 2 tablets (100 mg total) by mouth every 12 (twelve) hours as needed for moderate pain.  . traZODone (DESYREL) 100 MG tablet Take 1 tablet (100 mg total) by mouth at bedtime as needed for sleep.  . [DISCONTINUED] escitalopram (LEXAPRO) 10 MG tablet Take 1 tablet (10 mg total) by mouth daily.  . [DISCONTINUED] ezetimibe (ZETIA) 10 MG tablet Take 1 tablet (10 mg total) by mouth daily.  . [DISCONTINUED] folic acid (FOLVITE) 1 MG tablet Take 1 tablet (1 mg total) by mouth daily.  . [DISCONTINUED] gabapentin (NEURONTIN) 100 MG capsule Take 100 mg by mouth 2 (two) times daily.  . [DISCONTINUED] lisinopril (PRINIVIL,ZESTRIL) 2.5 MG tablet Take 2 tablets (5 mg total) by mouth daily.  . [DISCONTINUED] meloxicam (MOBIC) 15 MG tablet Take 1 tablet (15 mg total) by mouth daily.  . [DISCONTINUED] traMADol (ULTRAM) 50 MG tablet Take 2 tablets (100 mg total) by mouth every 12 (twelve) hours as needed for moderate pain.  . [DISCONTINUED] traZODone (DESYREL) 100 MG tablet Take 1 tablet (100 mg total) by mouth  at bedtime as needed for sleep.   No facility-administered encounter medications on file as of 04/14/2019.     Surgical History: Past Surgical History:  Procedure Laterality Date  . CHOLECYSTECTOMY    . COLPOSCOPY  07/26/2015  . FINGER SURGERY      Medical History: Past Medical History:  Diagnosis Date  . Abnormal Pap smear of cervix 06/20/2015  . Anxiety   . 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 Guillain-Barre syndrome (HCC)   . Nicotine dependence   . Stroke (HCC)    . Torn rotator cuff     Family History: Family History  Problem Relation Age of Onset  . Cancer Mother   . Stroke Paternal Grandfather     Social History   Socioeconomic History  . Marital status: Married    Spouse name: Not on file  . Number of children: Not on file  . Years of education: Not on file  . Highest education level: Not on file  Occupational History  . Not on file  Social Needs  . Financial resource strain: Not on file  . Food insecurity    Worry: Not on file    Inability: Not on file  . Transportation needs    Medical: Not on file    Non-medical: Not on file  Tobacco Use  . Smoking status: Current Every Day Smoker    Packs/day: 2.00    Types: Cigarettes  . Smokeless tobacco: Never Used  Substance and Sexual Activity  . Alcohol use: No  . Drug use: No  . Sexual activity: Yes    Birth control/protection: Post-menopausal  Lifestyle  . Physical activity    Days per week: Not on file    Minutes per session: Not on file  . Stress: Not on file  Relationships  . Social Musician on phone: Not on file    Gets together: Not on file    Attends religious service: Not on file    Active member of club or organization: Not on file    Attends meetings of clubs or organizations: Not on file    Relationship status: Not on file  . Intimate partner violence    Fear of current or ex partner: Not on file    Emotionally abused: Not on file    Physically abused: Not on file    Forced sexual activity: Not on file  Other Topics Concern  . Not on file  Social History Narrative  . Not on file      Review of Systems  Constitutional: Negative for activity change, chills, fatigue, fever and unexpected weight change.  HENT: Negative for congestion, postnasal drip, rhinorrhea, sneezing and sore throat.   Respiratory: Negative for cough, chest tightness, shortness of breath and wheezing.   Cardiovascular: Negative for chest pain and palpitations.   Gastrointestinal: Negative for abdominal distention, abdominal pain, constipation, diarrhea, nausea and vomiting.  Endocrine: Negative for cold intolerance, heat intolerance, polydipsia and polyuria.  Musculoskeletal: Positive for arthralgias, back pain and myalgias. Negative for joint swelling and neck pain.  Skin: Negative for rash.  Allergic/Immunologic: Negative for environmental allergies.  Neurological: Positive for headaches. Negative for tremors and numbness.  Hematological: Negative for adenopathy.  Psychiatric/Behavioral: Positive for dysphoric mood and sleep disturbance. Negative for behavioral problems (Depression) and suicidal ideas. The patient is nervous/anxious.        Well controlled on current  medication.    Today's Vitals   04/14/19 1645  BP: 132/90  Pulse: 86  Resp: 16  SpO2: 96%  Weight: 173 lb 9.6 oz (78.7 kg)  Height: 5' 0.75" (1.543 m)   Body mass index is 33.07 kg/m.  Physical Exam Vitals signs and nursing note reviewed.  Constitutional:      Appearance: Normal appearance. She is well-developed.  HENT:     Head: Normocephalic and atraumatic.  Eyes:     Pupils: Pupils are equal, round, and reactive to light.  Neck:     Musculoskeletal: Normal range of motion and neck supple.  Cardiovascular:     Rate and Rhythm: Normal rate and regular rhythm.     Heart sounds: Normal heart sounds.  Pulmonary:     Effort: Pulmonary effort is normal.     Breath sounds: Normal breath sounds. No wheezing.  Musculoskeletal: Normal range of motion.     Comments: Chronic tendernes in left shoulder and arm as well as pain in both hands. ROM and strength are at their baseline in left arm. ROM and strength are intact in both hands.   Lymphadenopathy:     Cervical: No cervical adenopathy.  Skin:    General: Skin is warm and dry.     Capillary Refill: Capillary refill takes 2 to 3 seconds.  Neurological:     Mental Status: She is alert and oriented to person, place, and  time.  Psychiatric:        Behavior: Behavior normal.        Thought Content: Thought content normal.        Judgment: Judgment normal.    Assessment/Plan: 1. Essential hypertension Stable. Continue lisinopril as prescribed  - lisinopril (ZESTRIL) 2.5 MG tablet; Take 2 tablets (5 mg total) by mouth daily.  Dispense: 30 tablet; Refill: 3  2. Mixed hyperlipidemia - ezetimibe (ZETIA) 10 MG tablet; Take 1 tablet (10 mg total) by mouth daily.  Dispense: 30 tablet; Refill: 3  3. Primary generalized (osteo)arthritis Related to prior diagnosis with Guillain Barre syndrome. Continue meloxicam 15mg  daily. Take tramadol 100mg  twice daily if needed for more severe pain. May take gabapentin 100mg  twice daily. Refills provided for all of these today.  - gabapentin (NEURONTIN) 100 MG capsule; Take 1 capsule (100 mg total) by mouth 2 (two) times daily.  Dispense: 60 capsule; Refill: 11 - meloxicam (MOBIC) 15 MG tablet; Take 1 tablet (15 mg total) by mouth daily.  Dispense: 30 tablet; Refill: 3 - traMADol (ULTRAM) 50 MG tablet; Take 2 tablets (100 mg total) by mouth every 12 (twelve) hours as needed for moderate pain.  Dispense: 120 tablet; Refill: 2  4. Depression, major, single episode, moderate (HCC) Stable. Continue lexapro as prescribed. - escitalopram (LEXAPRO) 10 MG tablet; Take 1 tablet (10 mg total) by mouth daily.  Dispense: 30 tablet; Refill: 3  5. Iron deficiency anemia, unspecified iron deficiency anemia type - folic acid (FOLVITE) 1 MG tablet; Take 1 tablet (1 mg total) by mouth daily.  Dispense: 30 tablet; Refill: 3  6. Guillain Barr syndrome (HCC) - gabapentin (NEURONTIN) 100 MG capsule; Take 1 capsule (100 mg total) by mouth 2 (two) times daily.  Dispense: 60 capsule; Refill: 11  7. Primary insomnia May take trazodone 100mg  at bedtime as needed. Refills provided today. - traZODone (DESYREL) 100 MG tablet; Take 1 tablet (100 mg total) by mouth at bedtime as needed for sleep.   Dispense: 30 tablet; Refill: 3  General Counseling: Edina verbalizes understanding  of the findings of todays visit and agrees with plan of treatment. I have discussed any further diagnostic evaluation that may be needed or ordered today. We also reviewed her medications today. she has been encouraged to call the office with any questions or concerns that should arise related to todays visit.   Hypertension Counseling:   The following hypertensive lifestyle modification were recommended and discussed:  1. Limiting alcohol intake to less than 1 oz/day of ethanol:(24 oz of beer or 8 oz of wine or 2 oz of 100-proof whiskey). 2. Take baby ASA 81 mg daily. 3. Importance of regular aerobic exercise and losing weight. 4. Reduce dietary saturated fat and cholesterol intake for overall cardiovascular health. 5. Maintaining adequate dietary potassium, calcium, and magnesium intake. 6. Regular monitoring of the blood pressure. 7. Reduce sodium intake to less than 100 mmol/day (less than 2.3 gm of sodium or less than 6 gm of sodium choride)   This patient was seen by Vincent GrosHeather Xavien Dauphinais FNP Collaboration with Dr Lyndon CodeFozia M Khan as a part of collaborative care agreement  Meds ordered this encounter  Medications  . escitalopram (LEXAPRO) 10 MG tablet    Sig: Take 1 tablet (10 mg total) by mouth daily.    Dispense:  30 tablet    Refill:  3    Order Specific Question:   Supervising Provider    Answer:   Lyndon CodeKHAN, FOZIA M [[id number]]  . ezetimibe (ZETIA) 10 MG tablet    Sig: Take 1 tablet (10 mg total) by mouth daily.    Dispense:  30 tablet    Refill:  3    High cholesterol and history of stroke. Statins have caused excess joint and muscle pain.    Order Specific Question:   Supervising Provider    Answer:   Lyndon CodeKHAN, FOZIA M [[id number]]  . folic acid (FOLVITE) 1 MG tablet    Sig: Take 1 tablet (1 mg total) by mouth daily.    Dispense:  30 tablet    Refill:  3    Order Specific Question:   Supervising Provider    Answer:    Lyndon CodeKHAN, FOZIA M [[id number]]  . gabapentin (NEURONTIN) 100 MG capsule    Sig: Take 1 capsule (100 mg total) by mouth 2 (two) times daily.    Dispense:  60 capsule    Refill:  11    Order Specific Question:   Supervising Provider    Answer:   Lyndon CodeKHAN, FOZIA M [[id number]]  . lisinopril (ZESTRIL) 2.5 MG tablet    Sig: Take 2 tablets (5 mg total) by mouth daily.    Dispense:  30 tablet    Refill:  3    Please note decreased dose    Order Specific Question:   Supervising Provider    Answer:   Lyndon CodeKHAN, FOZIA M [[id number]]  . meloxicam (MOBIC) 15 MG tablet    Sig: Take 1 tablet (15 mg total) by mouth daily.    Dispense:  30 tablet    Refill:  3    Order Specific Question:   Supervising Provider    Answer:   Lyndon CodeKHAN, FOZIA M [[id number]]  . traMADol (ULTRAM) 50 MG tablet    Sig: Take 2 tablets (100 mg total) by mouth every 12 (twelve) hours as needed for moderate pain.    Dispense:  120 tablet    Refill:  2    Order Specific Question:   Supervising Provider    Answer:   Lyndon CodeKHAN, FOZIA M [[id number]]  .  traZODone (DESYREL) 100 MG tablet    Sig: Take 1 tablet (100 mg total) by mouth at bedtime as needed for sleep.    Dispense:  30 tablet    Refill:  3    Order Specific Question:   Supervising Provider    Answer:   Lyndon CodeKHAN, FOZIA M [[id number]]    Time spent: 225 Minutes      Dr Lyndon CodeFozia M Khan Internal medicine

## 2019-04-25 ENCOUNTER — Other Ambulatory Visit: Payer: Self-pay

## 2019-04-25 DIAGNOSIS — R110 Nausea: Secondary | ICD-10-CM

## 2019-04-25 MED ORDER — ONDANSETRON 4 MG PO TBDP: 4.0000 mg | ORAL_TABLET | Freq: Three times a day (TID) | ORAL | 3 refills | Status: DC | PRN

## 2019-05-10 ENCOUNTER — Other Ambulatory Visit: Payer: Self-pay | Admitting: Nurse Practitioner

## 2019-05-10 DIAGNOSIS — R110 Nausea: Secondary | ICD-10-CM

## 2019-05-10 MED ORDER — ONDANSETRON 4 MG PO TBDP: 4.0000 mg | ORAL_TABLET | Freq: Three times a day (TID) | ORAL | 3 refills | Status: DC | PRN

## 2019-05-10 NOTE — Progress Notes (Signed)
Renewed sofran 4mg  to take as needed for nausea per patient request.

## 2019-07-18 ENCOUNTER — Ambulatory Visit: Payer: Commercial Managed Care - PPO | Admitting: Nurse Practitioner

## 2019-08-05 ENCOUNTER — Ambulatory Visit: Payer: Commercial Managed Care - PPO | Admitting: Nurse Practitioner

## 2019-11-02 ENCOUNTER — Telehealth: Payer: Self-pay

## 2019-11-02 NOTE — Telephone Encounter (Signed)
Confirmed appointment with patients husband. klh °

## 2019-11-04 ENCOUNTER — Ambulatory Visit: Payer: Commercial Managed Care - PPO | Admitting: Adult Health

## 2019-11-14 ENCOUNTER — Telehealth: Payer: Self-pay

## 2019-11-14 NOTE — Telephone Encounter (Signed)
Called lmom informing patient of virtual visit. klh 

## 2019-11-16 ENCOUNTER — Ambulatory Visit: Payer: Commercial Managed Care - PPO | Admitting: Adult Health

## 2019-11-16 ENCOUNTER — Telehealth: Payer: Self-pay

## 2019-11-16 ENCOUNTER — Encounter: Payer: Self-pay | Admitting: Adult Health

## 2019-11-16 DIAGNOSIS — F321 Major depressive disorder, single episode, moderate: Secondary | ICD-10-CM

## 2019-11-16 DIAGNOSIS — G610 Guillain-Barre syndrome: Secondary | ICD-10-CM | POA: Diagnosis not present

## 2019-11-16 DIAGNOSIS — M150 Primary generalized (osteo)arthritis: Secondary | ICD-10-CM

## 2019-11-16 DIAGNOSIS — I10 Essential (primary) hypertension: Secondary | ICD-10-CM

## 2019-11-16 DIAGNOSIS — E782 Mixed hyperlipidemia: Secondary | ICD-10-CM

## 2019-11-16 DIAGNOSIS — D509 Iron deficiency anemia, unspecified: Secondary | ICD-10-CM

## 2019-11-16 DIAGNOSIS — R110 Nausea: Secondary | ICD-10-CM

## 2019-11-16 DIAGNOSIS — E559 Vitamin D deficiency, unspecified: Secondary | ICD-10-CM

## 2019-11-16 MED ORDER — GABAPENTIN 100 MG PO CAPS: 100.0000 mg | ORAL_CAPSULE | Freq: Two times a day (BID) | ORAL | 11 refills | Status: DC

## 2019-11-16 MED ORDER — EZETIMIBE 10 MG PO TABS: 10.0000 mg | ORAL_TABLET | Freq: Every day | ORAL | 3 refills | Status: DC

## 2019-11-16 MED ORDER — FOLIC ACID 1 MG PO TABS: 1.0000 mg | ORAL_TABLET | Freq: Every day | ORAL | 3 refills | Status: DC

## 2019-11-16 MED ORDER — ESCITALOPRAM OXALATE 10 MG PO TABS: 10.0000 mg | ORAL_TABLET | Freq: Every day | ORAL | 3 refills | Status: DC

## 2019-11-16 MED ORDER — TRAMADOL HCL 50 MG PO TABS: 100.0000 mg | ORAL_TABLET | Freq: Two times a day (BID) | ORAL | 2 refills | Status: DC | PRN

## 2019-11-16 MED ORDER — ONDANSETRON 4 MG PO TBDP: 4.0000 mg | ORAL_TABLET | Freq: Three times a day (TID) | ORAL | 3 refills | Status: DC | PRN

## 2019-11-16 MED ORDER — ERGOCALCIFEROL 1.25 MG (50000 UT) PO CAPS: 50000.0000 [IU] | ORAL_CAPSULE | ORAL | 5 refills | Status: DC

## 2019-11-16 MED ORDER — LISINOPRIL 2.5 MG PO TABS: 5.0000 mg | ORAL_TABLET | Freq: Every day | ORAL | 3 refills | Status: DC

## 2019-11-16 MED ORDER — MELOXICAM 15 MG PO TABS: 15.0000 mg | ORAL_TABLET | Freq: Every day | ORAL | 3 refills | Status: DC

## 2019-11-16 NOTE — Progress Notes (Signed)
[facility] [address] [place], [place] [address]  Internal MEDICINE  Telephone Visit  Patient Name: Alexandria Murray  [id number]  [id number]  Date of Service: 11/17/2019  I connected with the patient at 354 by telephone and verified the patients identity using two identifiers.   I discussed the limitations, risks, security and privacy concerns of performing an evaluation and management service by telephone and the availability of in person appointments. I also discussed with the patient that there may be a patient responsible charge related to the service.  The patient expressed understanding and agrees to proceed.    Chief Complaint  Patient presents with  . Telephone Assessment    would like a note that she cannot lift anything more than 2 pounds   . Telephone Screen  . Hypertension  . Medication Refill    tramadol, meloxicam, odansetron, gabapentin. folic acid, lisinopril,lexapro    HPI  Pt is seen today via video.  Overall she is doing well.  Denies any new or concerning issues at this time. 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  She needs a new note for her work to explain these limitations.  She has good control of her anxiety, HTN, osteoarthritis and depression. She is in need of multiple refills at this time.    Current Medication: Outpatient Encounter Medications as of 11/16/2019  Medication Sig  . acetaminophen (TYLENOL) 500 MG tablet Take 1,000 mg by mouth every 6 (six) hours as needed for mild pain.  . Aspirin-Acetaminophen-Caffeine (PAMPRIN MAX PO) Take 2 tablets by mouth daily.  Marland Kitchen docusate sodium (COLACE) 100 MG capsule Take 1 capsule (100 mg total) by mouth 2 (two) times daily.  . ergocalciferol (DRISDOL) 1.25 MG (50000 UT) capsule Take 1 capsule (50,000 Units total) by mouth once a week.  . escitalopram (LEXAPRO) 10 MG tablet Take 1 tablet (10 mg total) by mouth daily.  Marland Kitchen ezetimibe  (ZETIA) 10 MG tablet Take 1 tablet (10 mg total) by mouth daily.  . ferrous sulfate 325 (65 FE) MG tablet Take 1 tablet (325 mg total) by mouth daily.  . folic acid (FOLVITE) 1 MG tablet Take 1 tablet (1 mg total) by mouth daily.  Marland Kitchen gabapentin (NEURONTIN) 100 MG capsule Take 1 capsule (100 mg total) by mouth 2 (two) times daily.  Marland Kitchen lisinopril (ZESTRIL) 2.5 MG tablet Take 2 tablets (5 mg total) by mouth daily.  . meloxicam (MOBIC) 15 MG tablet Take 1 tablet (15 mg total) by mouth daily.  . Multiple Vitamin (MULTIVITAMIN WITH MINERALS) TABS tablet Take 1 tablet by mouth daily.  . Multiple Vitamins-Minerals (MULTIVITAMIN PO) Take 1 tablet by mouth daily.  . ondansetron (ZOFRAN-ODT) 4 MG disintegrating tablet Take 1 tablet (4 mg total) by mouth every 8 (eight) hours as needed for nausea or vomiting.  . traMADol (ULTRAM) 50 MG tablet Take 2 tablets (100 mg total) by mouth every 12 (twelve) hours as needed for moderate pain.  . traZODone (DESYREL) 100 MG tablet Take 1 tablet (100 mg total) by mouth at bedtime as needed for sleep.  . [DISCONTINUED] ergocalciferol (DRISDOL) 1.25 MG (50000 UT) capsule Take 1 capsule (50,000 Units total) by mouth once a week.  . [DISCONTINUED] escitalopram (LEXAPRO) 10 MG tablet Take 1 tablet (10 mg total) by mouth daily.  . [DISCONTINUED] ezetimibe (ZETIA) 10 MG tablet Take 1 tablet (10 mg total) by mouth daily.  . [DISCONTINUED] folic acid (FOLVITE)  1 MG tablet Take 1 tablet (1 mg total) by mouth daily.  . [DISCONTINUED] gabapentin (NEURONTIN) 100 MG capsule Take 1 capsule (100 mg total) by mouth 2 (two) times daily.  . [DISCONTINUED] lisinopril (ZESTRIL) 2.5 MG tablet Take 2 tablets (5 mg total) by mouth daily.  . [DISCONTINUED] meloxicam (MOBIC) 15 MG tablet Take 1 tablet (15 mg total) by mouth daily.  . [DISCONTINUED] ondansetron (ZOFRAN-ODT) 4 MG disintegrating tablet Take 1 tablet (4 mg total) by mouth every 8 (eight) hours as needed for nausea or vomiting.  .  [DISCONTINUED] traMADol (ULTRAM) 50 MG tablet Take 2 tablets (100 mg total) by mouth every 12 (twelve) hours as needed for moderate pain.   No facility-administered encounter medications on file as of 11/16/2019.    Surgical History: Past Surgical History:  Procedure Laterality Date  . CHOLECYSTECTOMY    . COLPOSCOPY  07/26/2015  . FINGER SURGERY      Medical History: Past Medical History:  Diagnosis Date  . Abnormal Pap smear of cervix 06/20/2015  . Anxiety   . 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 Guillain-Barre syndrome ([place])   . Nicotine dependence   . Stroke ([place])   . Torn rotator cuff     Family History: Family History  Problem Relation Age of Onset  . Cancer Mother   . Stroke Paternal Grandfather     Social History   Socioeconomic History  . Marital status: Married    Spouse name: Not on file  . Number of children: Not on file  . Years of education: Not on file  . Highest education level: Not on file  Occupational History  . Not on file  Tobacco Use  . Smoking status: Current Every Day Smoker    Packs/day: 2.00    Types: Cigarettes  . Smokeless tobacco: Never Used  Substance and Sexual Activity  . Alcohol use: No  . Drug use: No  . Sexual activity: Yes    Birth control/protection: Post-menopausal  Other Topics Concern  . Not on file  Social History Narrative  . Not on file   Social Determinants of Health   Financial Resource Strain:   . Difficulty of Paying Living Expenses: Not on file  Food Insecurity:   . Worried About Charity fundraiser in the Last Year: Not on file  . Ran Out of Food in the Last Year: Not on file  Transportation Needs:   . Lack of Transportation (Medical): Not on file  . Lack of Transportation (Non-Medical): Not on file  Physical Activity:   . Days of Exercise per Week: Not on file  . Minutes of Exercise per Session: Not on file  Stress:    . Feeling of Stress : Not on file  Social Connections:   . Frequency of Communication with Friends and Family: Not on file  . Frequency of Social Gatherings with Friends and Family: Not on file  . Attends Religious Services: Not on file  . Active Member of Clubs or Organizations: Not on file  . Attends Archivist Meetings: Not on file  . Marital Status: Not on file  Intimate Partner Violence:   . Fear of Current or Ex-Partner: Not on file  . Emotionally Abused: Not on file  . Physically Abused: Not on file  . Sexually Abused: Not on file      Review of Systems  Constitutional: Negative for  chills, fatigue and unexpected weight change.  HENT: Negative for congestion, rhinorrhea, sneezing and sore throat.   Eyes: Negative for photophobia, pain and redness.  Respiratory: Negative for cough, chest tightness and shortness of breath.   Cardiovascular: Negative for chest pain and palpitations.  Gastrointestinal: Negative for abdominal pain, constipation, diarrhea, nausea and vomiting.  Endocrine: Negative.   Genitourinary: Negative for dysuria and frequency.  Musculoskeletal: Negative for arthralgias, back pain, joint swelling and neck pain.  Skin: Negative for rash.  Allergic/Immunologic: Negative.   Neurological: Negative for tremors and numbness.  Hematological: Negative for adenopathy. Does not bruise/bleed easily.  Psychiatric/Behavioral: Negative for behavioral problems and sleep disturbance. The patient is not nervous/anxious.     Vital Signs: Temp 97.8 F (36.6 C)   Ht 5' 1.5" (1.562 m)   Wt 169 lb 12.8 oz (77 kg)   BMI 31.56 kg/m    Observation/Objective:  Well appearing, NAD noted.    Assessment/Plan: 1. Primary generalized (osteo)arthritis Controlled, refilled patients medications  Reviewed risks and possible side effects associated with taking opiates, benzodiazepines and other CNS depressants. Combination of these could cause dizziness and  drowsiness. Advised patient not to drive or operate machinery when taking these medications, as patient's and other's life can be at risk and will have consequences. Patient verbalized understanding in this matter. Dependence and abuse for these drugs will be monitored closely. A Controlled substance policy and procedure is on file which allows [employer] [facility] to order a urine drug screen test at any visit. Patient understands and agrees with the plan - traMADol (ULTRAM) 50 MG tablet; Take 2 tablets (100 mg total) by mouth every 12 (twelve) hours as needed for moderate pain.  Dispense: 120 tablet; Refill: 2 - meloxicam (MOBIC) 15 MG tablet; Take 1 tablet (15 mg total) by mouth daily.  Dispense: 30 tablet; Refill: 3 - gabapentin (NEURONTIN) 100 MG capsule; Take 1 capsule (100 mg total) by mouth 2 (two) times daily.  Dispense: 60 capsule; Refill: 11  2. Guillain Barr syndrome (HCC) Stable, continue Neurontin as this time. - gabapentin (NEURONTIN) 100 MG capsule; Take 1 capsule (100 mg total) by mouth 2 (two) times daily.  Dispense: 60 capsule; Refill: 11  3. Iron deficiency anemia, unspecified iron deficiency anemia type Refilled folvite - folic acid (FOLVITE) 1 MG tablet; Take 1 tablet (1 mg total) by mouth daily.  Dispense: 30 tablet; Refill: 3  4. Essential hypertension Stable, continue lisinopril. - lisinopril (ZESTRIL) 2.5 MG tablet; Take 2 tablets (5 mg total) by mouth daily.  Dispense: 30 tablet; Refill: 3  5. Depression, major, single episode, moderate (HCC) Continue lexapro at this time.  - escitalopram (LEXAPRO) 10 MG tablet; Take 1 tablet (10 mg total) by mouth daily.  Dispense: 30 tablet; Refill: 3  6. Mixed hyperlipidemia Refilled zetia, continue as before.  - ezetimibe (ZETIA) 10 MG tablet; Take 1 tablet (10 mg total) by mouth daily.  Dispense: 30 tablet; Refill: 3  7. Nausea Refilled zofran for pRn use - ondansetron (ZOFRAN-ODT) 4 MG disintegrating tablet; Take 1  tablet (4 mg total) by mouth every 8 (eight) hours as needed for nausea or vomiting.  Dispense: 30 tablet; Refill: 3  8. Vitamin D deficiency Refilled drisdol.  - ergocalciferol (DRISDOL) 1.25 MG (50000 UT) capsule; Take 1 capsule (50,000 Units total) by mouth once a week.  Dispense: 4 capsule; Refill: 5  General Counseling: Lacy verbalizes understanding of the findings of today's phone visit and agrees with plan of treatment. I have  discussed any further diagnostic evaluation that may be needed or ordered today. We also reviewed her medications today. she has been encouraged to call the office with any questions or concerns that should arise related to todays visit.    No orders of the defined types were placed in this encounter.   Meds ordered this encounter  Medications  . traMADol (ULTRAM) 50 MG tablet    Sig: Take 2 tablets (100 mg total) by mouth every 12 (twelve) hours as needed for moderate pain.    Dispense:  120 tablet    Refill:  2  . meloxicam (MOBIC) 15 MG tablet    Sig: Take 1 tablet (15 mg total) by mouth daily.    Dispense:  30 tablet    Refill:  3  . ondansetron (ZOFRAN-ODT) 4 MG disintegrating tablet    Sig: Take 1 tablet (4 mg total) by mouth every 8 (eight) hours as needed for nausea or vomiting.    Dispense:  30 tablet    Refill:  3  . gabapentin (NEURONTIN) 100 MG capsule    Sig: Take 1 capsule (100 mg total) by mouth 2 (two) times daily.    Dispense:  60 capsule    Refill:  11  . folic acid (FOLVITE) 1 MG tablet    Sig: Take 1 tablet (1 mg total) by mouth daily.    Dispense:  30 tablet    Refill:  3  . lisinopril (ZESTRIL) 2.5 MG tablet    Sig: Take 2 tablets (5 mg total) by mouth daily.    Dispense:  30 tablet    Refill:  3    Please note decreased dose  . escitalopram (LEXAPRO) 10 MG tablet    Sig: Take 1 tablet (10 mg total) by mouth daily.    Dispense:  30 tablet    Refill:  3  . ezetimibe (ZETIA) 10 MG tablet    Sig: Take 1 tablet (10 mg total)  by mouth daily.    Dispense:  30 tablet    Refill:  3    High cholesterol and history of stroke. Statins have caused excess joint and muscle pain.  . ergocalciferol (DRISDOL) 1.25 MG (50000 UT) capsule    Sig: Take 1 capsule (50,000 Units total) by mouth once a week.    Dispense:  4 capsule    Refill:  5    Time spent:20 Minutes    Blima Ledger AGNP-C Internal medicine

## 2019-11-16 NOTE — Telephone Encounter (Signed)
Confirmed virtual visit with patient. klh 

## 2020-03-10 ENCOUNTER — Other Ambulatory Visit: Payer: Self-pay | Admitting: Adult Health

## 2020-03-10 DIAGNOSIS — M150 Primary generalized (osteo)arthritis: Secondary | ICD-10-CM

## 2020-05-10 ENCOUNTER — Telehealth: Payer: Self-pay

## 2020-05-10 NOTE — Telephone Encounter (Signed)
Lmom to confirm and screen for 05-15-20 ov. 

## 2020-05-15 ENCOUNTER — Encounter: Payer: Self-pay | Admitting: Nurse Practitioner

## 2020-05-15 ENCOUNTER — Other Ambulatory Visit: Payer: Self-pay

## 2020-05-15 ENCOUNTER — Ambulatory Visit: Payer: Commercial Managed Care - PPO | Admitting: Nurse Practitioner

## 2020-05-15 VITALS — BP 105/79 | HR 100 | Temp 97.3°F | Resp 16 | Ht 61.5 in | Wt 175.8 lb

## 2020-05-15 DIAGNOSIS — D509 Iron deficiency anemia, unspecified: Secondary | ICD-10-CM | POA: Diagnosis not present

## 2020-05-15 DIAGNOSIS — M150 Primary generalized (osteo)arthritis: Secondary | ICD-10-CM | POA: Diagnosis not present

## 2020-05-15 DIAGNOSIS — E782 Mixed hyperlipidemia: Secondary | ICD-10-CM

## 2020-05-15 DIAGNOSIS — F321 Major depressive disorder, single episode, moderate: Secondary | ICD-10-CM

## 2020-05-15 DIAGNOSIS — I10 Essential (primary) hypertension: Secondary | ICD-10-CM

## 2020-05-15 DIAGNOSIS — R110 Nausea: Secondary | ICD-10-CM

## 2020-05-15 DIAGNOSIS — F5101 Primary insomnia: Secondary | ICD-10-CM

## 2020-05-15 DIAGNOSIS — G610 Guillain-Barre syndrome: Secondary | ICD-10-CM

## 2020-05-15 MED ORDER — ESCITALOPRAM OXALATE 10 MG PO TABS: 10.0000 mg | ORAL_TABLET | Freq: Every day | ORAL | 3 refills | Status: DC

## 2020-05-15 MED ORDER — TRAZODONE HCL 100 MG PO TABS: 100.0000 mg | ORAL_TABLET | Freq: Every evening | ORAL | 3 refills | Status: DC | PRN

## 2020-05-15 MED ORDER — LISINOPRIL 2.5 MG PO TABS: 5.0000 mg | ORAL_TABLET | Freq: Every day | ORAL | 3 refills | Status: DC

## 2020-05-15 MED ORDER — GABAPENTIN 100 MG PO CAPS: 100.0000 mg | ORAL_CAPSULE | Freq: Two times a day (BID) | ORAL | 3 refills | Status: DC

## 2020-05-15 MED ORDER — ETODOLAC 400 MG PO TABS: 400.0000 mg | ORAL_TABLET | Freq: Two times a day (BID) | ORAL | 3 refills | Status: DC

## 2020-05-15 MED ORDER — TRAMADOL HCL 50 MG PO TABS: 100.0000 mg | ORAL_TABLET | Freq: Two times a day (BID) | ORAL | 3 refills | Status: DC | PRN

## 2020-05-15 MED ORDER — FOLIC ACID 1 MG PO TABS: 1.0000 mg | ORAL_TABLET | Freq: Every day | ORAL | 3 refills | Status: DC

## 2020-05-15 MED ORDER — EZETIMIBE 10 MG PO TABS: 10.0000 mg | ORAL_TABLET | Freq: Every day | ORAL | 3 refills | Status: DC

## 2020-05-15 MED ORDER — ONDANSETRON 4 MG PO TBDP: 4.0000 mg | ORAL_TABLET | Freq: Three times a day (TID) | ORAL | 3 refills | Status: DC | PRN

## 2020-05-15 NOTE — Progress Notes (Signed)
[facility] [address] [place], [place] [address]  Internal MEDICINE  Office Visit Note  Patient Name: Alexandria Murray  [id number]  [id number]  Date of Service: 06/10/2020  Chief Complaint  Patient presents with  . Follow-up  . Quality Metric Gaps    TDAP  . Arthritis    Getting worse    The patient is here for routine follow up visit. Left arm continues to have pain and weakness since she suffered from [place] berret syndrome. She takes meloxicam to treat pain and inflammation and tramadol as needed for more severe pain.  She finds that, recently, she is only having to take the tramadol once before work. Overall, she is doing better with pain control. She needs to have refills for this. Blood pressure is doing very well on low dose lisinopril. The patient is due to have screening mammogram, however, she would like to hold off on this for now until spread of COVID 19 decreases. She was vaccinated against COVID 19 and had no negative side effects.       Current Medication: Outpatient Encounter Medications as of 05/15/2020  Medication Sig  . acetaminophen (TYLENOL) 500 MG tablet Take 1,000 mg by mouth every 6 (six) hours as needed for mild pain.  . Aspirin-Acetaminophen-Caffeine (PAMPRIN MAX PO) Take 2 tablets by mouth daily.  Marland Kitchen docusate sodium (COLACE) 100 MG capsule Take 1 capsule (100 mg total) by mouth 2 (two) times daily.  . ergocalciferol (DRISDOL) 1.25 MG (50000 UT) capsule Take 1 capsule (50,000 Units total) by mouth once a week.  . escitalopram (LEXAPRO) 10 MG tablet Take 1 tablet (10 mg total) by mouth daily.  Marland Kitchen ezetimibe (ZETIA) 10 MG tablet Take 1 tablet (10 mg total) by mouth daily.  . ferrous sulfate 325 (65 FE) MG tablet Take 1 tablet (325 mg total) by mouth daily.  . folic acid (FOLVITE) 1 MG tablet Take 1 tablet (1 mg total) by mouth daily.  Marland Kitchen gabapentin (NEURONTIN) 100 MG capsule Take 1 capsule (100 mg total) by mouth 2 (two) times daily.  Marland Kitchen lisinopril  (ZESTRIL) 2.5 MG tablet Take 2 tablets (5 mg total) by mouth daily.  . Multiple Vitamin (MULTIVITAMIN WITH MINERALS) TABS tablet Take 1 tablet by mouth daily.  . Multiple Vitamins-Minerals (MULTIVITAMIN PO) Take 1 tablet by mouth daily.  . ondansetron (ZOFRAN-ODT) 4 MG disintegrating tablet Take 1 tablet (4 mg total) by mouth every 8 (eight) hours as needed for nausea or vomiting.  . traMADol (ULTRAM) 50 MG tablet Take 2 tablets (100 mg total) by mouth every 12 (twelve) hours as needed for moderate pain.  . traZODone (DESYREL) 100 MG tablet Take 1 tablet (100 mg total) by mouth at bedtime as needed for sleep.  . [DISCONTINUED] escitalopram (LEXAPRO) 10 MG tablet Take 1 tablet (10 mg total) by mouth daily.  . [DISCONTINUED] ezetimibe (ZETIA) 10 MG tablet Take 1 tablet (10 mg total) by mouth daily.  . [DISCONTINUED] folic acid (FOLVITE) 1 MG tablet Take 1 tablet (1 mg total) by mouth daily.  . [DISCONTINUED] gabapentin (NEURONTIN) 100 MG capsule Take 1 capsule (100 mg total) by mouth 2 (two) times daily.  . [DISCONTINUED] lisinopril (ZESTRIL) 2.5 MG tablet Take 2 tablets (5 mg total) by mouth daily.  . [DISCONTINUED] meloxicam (MOBIC) 15 MG tablet Take 1 tablet by mouth once daily  . [DISCONTINUED] ondansetron (ZOFRAN-ODT) 4 MG disintegrating tablet Take 1 tablet (4 mg total) by mouth every 8 (eight) hours as needed for nausea  or vomiting.  . [DISCONTINUED] traMADol (ULTRAM) 50 MG tablet Take 2 tablets (100 mg total) by mouth every 12 (twelve) hours as needed for moderate pain.  . [DISCONTINUED] traZODone (DESYREL) 100 MG tablet Take 1 tablet (100 mg total) by mouth at bedtime as needed for sleep.  Marland Kitchen. etodolac (LODINE) 400 MG tablet Take 1 tablet (400 mg total) by mouth 2 (two) times daily.   No facility-administered encounter medications on file as of 05/15/2020.    Surgical History: Past Surgical History:  Procedure Laterality Date  . CHOLECYSTECTOMY    . COLPOSCOPY  07/26/2015  . FINGER  SURGERY      Medical History: Past Medical History:  Diagnosis Date  . Abnormal Pap smear of cervix 06/20/2015  . Anxiety   . 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 Guillain-Barre syndrome (HCC)   . Nicotine dependence   . Stroke (HCC)   . Torn rotator cuff     Family History: Family History  Problem Relation Age of Onset  . Cancer Mother   . Stroke Paternal Grandfather     Social History   Socioeconomic History  . Marital status: Married    Spouse name: Not on file  . Number of children: Not on file  . Years of education: Not on file  . Highest education level: Not on file  Occupational History  . Not on file  Tobacco Use  . Smoking status: Current Every Day Smoker    Packs/day: 2.00    Types: Cigarettes  . Smokeless tobacco: Never Used  Vaping Use  . Vaping Use: Never used  Substance and Sexual Activity  . Alcohol use: No  . Drug use: No  . Sexual activity: Yes    Birth control/protection: Post-menopausal  Other Topics Concern  . Not on file  Social History Narrative  . Not on file   Social Determinants of Health   Financial Resource Strain:   . Difficulty of Paying Living Expenses:   Food Insecurity:   . Worried About Programme researcher, broadcasting/film/videounning Out of Food in the Last Year:   . Baristaan Out of Food in the Last Year:   Transportation Needs:   . Freight forwarderLack of Transportation (Medical):   Marland Kitchen. Lack of Transportation (Non-Medical):   Physical Activity:   . Days of Exercise per Week:   . Minutes of Exercise per Session:   Stress:   . Feeling of Stress :   Social Connections:   . Frequency of Communication with Friends and Family:   . Frequency of Social Gatherings with Friends and Family:   . Attends Religious Services:   . Active Member of Clubs or Organizations:   . Attends BankerClub or Organization Meetings:   Marland Kitchen. Marital Status:   Intimate Partner Violence:   . Fear of Current or Ex-Partner:   .  Emotionally Abused:   Marland Kitchen. Physically Abused:   . Sexually Abused:       Review of Systems  Constitutional: Negative for activity change, chills, fatigue, fever and unexpected weight change.  HENT: Negative for congestion, postnasal drip, rhinorrhea, sneezing and sore throat.   Respiratory: Negative for cough, chest tightness, shortness of breath and wheezing.   Cardiovascular: Negative for chest pain and palpitations.  Gastrointestinal: Negative for abdominal distention, abdominal pain, constipation, diarrhea, nausea and vomiting.  Endocrine: Negative for cold intolerance, heat intolerance, polydipsia and polyuria.  Musculoskeletal: Positive for arthralgias, back pain and myalgias. Negative  for joint swelling and neck pain.  Skin: Negative for rash.  Allergic/Immunologic: Negative for environmental allergies.  Neurological: Positive for headaches. Negative for dizziness, tremors and numbness.  Hematological: Negative for adenopathy.  Psychiatric/Behavioral: Positive for dysphoric mood and sleep disturbance. Negative for behavioral problems (Depression) and suicidal ideas. The patient is nervous/anxious.        Well controlled on current medication.    Today's Vitals   05/15/20 1551  BP: 105/79  Pulse: 100  Resp: 16  Temp: (!) 97.3 F (36.3 C)  SpO2: 95%  Weight: 175 lb 12.8 oz (79.7 kg)  Height: 5' 1.5" (1.562 m)   Body mass index is 32.68 kg/m.  Physical Exam Vitals and nursing note reviewed.  Constitutional:      Appearance: Normal appearance. She is well-developed.  HENT:     Head: Normocephalic and atraumatic.  Eyes:     Pupils: Pupils are equal, round, and reactive to light.  Cardiovascular:     Rate and Rhythm: Normal rate and regular rhythm.     Heart sounds: Normal heart sounds.  Pulmonary:     Effort: Pulmonary effort is normal.     Breath sounds: Normal breath sounds. No wheezing.  Abdominal:     Palpations: Abdomen is soft.  Musculoskeletal:         General: Normal range of motion.     Cervical back: Normal range of motion and neck supple.     Comments: Chronic tendernes in left shoulder and arm as well as pain in both hands. ROM and strength are at their baseline in left arm. ROM and strength are intact in both hands.   Lymphadenopathy:     Cervical: No cervical adenopathy.  Skin:    General: Skin is warm and dry.     Capillary Refill: Capillary refill takes 2 to 3 seconds.  Neurological:     Mental Status: She is alert and oriented to person, place, and time.  Psychiatric:        Mood and Affect: Mood normal.        Behavior: Behavior normal.        Thought Content: Thought content normal.        Judgment: Judgment normal.   Assessment/Plan: 1. Essential hypertension bp stable. Continue lisinopril as prescribed  - lisinopril (ZESTRIL) 2.5 MG tablet; Take 2 tablets (5 mg total) by mouth daily.  Dispense: 30 tablet; Refill: 3  2. Mixed hyperlipidemia Continue Zetia as prescribed  - ezetimibe (ZETIA) 10 MG tablet; Take 1 tablet (10 mg total) by mouth daily.  Dispense: 30 tablet; Refill: 3  3. Iron deficiency anemia, unspecified iron deficiency anemia type Continue OTC iron supplement and folic acid daily.  - folic acid (FOLVITE) 1 MG tablet; Take 1 tablet (1 mg total) by mouth daily.  Dispense: 30 tablet; Refill: 3  4. Primary generalized (osteo)arthritis Take lodine 400mg  up to twice daily as needed for pain/inflammation. May take tramadol 100mg  up to twice daily as needed for more severe pain. She should continue gabapentin as prescribed. Refills for all provided today.  - etodolac (LODINE) 400 MG tablet; Take 1 tablet (400 mg total) by mouth 2 (two) times daily.  Dispense: 60 tablet; Refill: 3 - gabapentin (NEURONTIN) 100 MG capsule; Take 1 capsule (100 mg total) by mouth 2 (two) times daily.  Dispense: 60 capsule; Refill: 3 - traMADol (ULTRAM) 50 MG tablet; Take 2 tablets (100 mg total) by mouth every 12 (twelve) hours as  needed for moderate pain.  Dispense: 120 tablet; Refill: 3  5. Depression, major, single episode, moderate (HCC) Stable. Continue lexapro 20mg  tablets daliy.  - escitalopram (LEXAPRO) 10 MG tablet; Take 1 tablet (10 mg total) by mouth daily.  Dispense: 30 tablet; Refill: 3  6. Guillain Barr syndrome (HCC) Continue gabapentin as previously prescribed  - gabapentin (NEURONTIN) 100 MG capsule; Take 1 capsule (100 mg total) by mouth 2 (two) times daily.  Dispense: 60 capsule; Refill: 3  7. Nausea May take zofran as needed and as prescribed.  - ondansetron (ZOFRAN-ODT) 4 MG disintegrating tablet; Take 1 tablet (4 mg total) by mouth every 8 (eight) hours as needed for nausea or vomiting.  Dispense: 30 tablet; Refill: 3  8. Primary insomnia May take trazodone 100mg  at bedtime as needed for acute insomnia.  - traZODone (DESYREL) 100 MG tablet; Take 1 tablet (100 mg total) by mouth at bedtime as needed for sleep.  Dispense: 30 tablet; Refill: 3  General Counseling: Satya verbalizes understanding of the findings of todays visit and agrees with plan of treatment. I have discussed any further diagnostic evaluation that may be needed or ordered today. We also reviewed her medications today. she has been encouraged to call the office with any questions or concerns that should arise related to todays visit.   This patient was seen by FNP Collaboration with Dr as a part of collaborative care agreement  Meds ordered this encounter  Medications  . etodolac (LODINE) 400 MG tablet    Sig: Take 1 tablet (400 mg total) by mouth 2 (two) times daily.    Dispense:  60 tablet    Refill:  3    D/c meloxicam    Order Specific Question:   Supervising Provider    Answer:   Vincent Gros [[id number]]  . escitalopram (LEXAPRO) 10 MG tablet    Sig: Take 1 tablet (10 mg total) by mouth daily.    Dispense:  30 tablet    Refill:  3    Order Specific Question:   Supervising Provider    Answer:    Lyndon Code [[id number]]  . ezetimibe (ZETIA) 10 MG tablet    Sig: Take 1 tablet (10 mg total) by mouth daily.    Dispense:  30 tablet    Refill:  3    High cholesterol and history of stroke. Statins have caused excess joint and muscle pain.    Order Specific Question:   Supervising Provider    Answer:   Lyndon Code [[id number]]  . folic acid (FOLVITE) 1 MG tablet    Sig: Take 1 tablet (1 mg total) by mouth daily.    Dispense:  30 tablet    Refill:  3    Order Specific Question:   Supervising Provider    Answer:   Lyndon Code [[id number]]  . gabapentin (NEURONTIN) 100 MG capsule    Sig: Take 1 capsule (100 mg total) by mouth 2 (two) times daily.    Dispense:  60 capsule    Refill:  3    Order Specific Question:   Supervising Provider    Answer:   Lyndon Code [[id number]]  . lisinopril (ZESTRIL) 2.5 MG tablet    Sig: Take 2 tablets (5 mg total) by mouth daily.    Dispense:  30 tablet    Refill:  3    Please note decreased dose    Order Specific Question:   Supervising Provider    Answer:  KHAN, FOZIA M [[id number]]  . ondansetron (ZOFRAN-ODT) 4 MG disintegrating tablet    Sig: Take 1 tablet (4 mg total) by mouth every 8 (eight) hours as needed for nausea or vomiting.    Dispense:  30 tablet    Refill:  3    Order Specific Question:   Supervising Provider    Answer:   Lyndon Code [[id number]]  . traMADol (ULTRAM) 50 MG tablet    Sig: Take 2 tablets (100 mg total) by mouth every 12 (twelve) hours as needed for moderate pain.    Dispense:  120 tablet    Refill:  3    Order Specific Question:   Supervising Provider    Answer:   Lyndon Code [[id number]]  . traZODone (DESYREL) 100 MG tablet    Sig: Take 1 tablet (100 mg total) by mouth at bedtime as needed for sleep.    Dispense:  30 tablet    Refill:  3    Order Specific Question:   Supervising Provider    Answer:   Lyndon Code [[id number]]    Total time spent: 30 Minutes   Time spent includes review of chart, medications, test results, and follow up plan  with the patient.      Dr Lyndon Code Internal medicine

## 2020-05-18 ENCOUNTER — Other Ambulatory Visit: Payer: Self-pay | Admitting: Nurse Practitioner

## 2020-05-19 LAB — RHEUMATOID FACTOR: Rheumatoid fact SerPl-aCnc: <10 IU/mL (ref 0.0–13.9)

## 2020-05-19 LAB — LIPID PANEL WITH LDL/HDL RATIO
Cholesterol, Total: 162 mg/dL (ref 100–199)
HDL: 34 mg/dL — ABNORMAL LOW (ref >39)
LDL Chol Calc (NIH): 102 mg/dL — ABNORMAL HIGH (ref 0–99)
LDL/HDL Ratio: 3 ratio (ref 0.0–3.2)
Triglycerides: 144 mg/dL (ref 0–149)
VLDL Cholesterol Cal: 26 mg/dL (ref 5–40)

## 2020-05-19 LAB — COMPREHENSIVE METABOLIC PANEL
ALT: 12 IU/L (ref 0–32)
AST: 18 IU/L (ref 0–40)
Albumin/Globulin Ratio: 1.8 (ref 1.2–2.2)
Albumin: 4.3 g/dL (ref 3.8–4.8)
Alkaline Phosphatase: 92 IU/L (ref 48–121)
BUN/Creatinine Ratio: 20 (ref 9–23)
BUN: 18 mg/dL (ref 6–24)
Bilirubin Total: 0.4 mg/dL (ref 0.0–1.2)
CO2: 22 mmol/L (ref 20–29)
Calcium: 9.2 mg/dL (ref 8.7–10.2)
Chloride: 99 mmol/L (ref 96–106)
Creatinine, Ser: 0.9 mg/dL (ref 0.57–1.00)
GFR calc Af Amer: 89 mL/min/{1.73_m2} (ref >59)
GFR calc non Af Amer: 77 mL/min/{1.73_m2} (ref >59)
Globulin, Total: 2.4 g/dL (ref 1.5–4.5)
Glucose: 87 mg/dL (ref 65–99)
Potassium: 4.1 mmol/L (ref 3.5–5.2)
Sodium: 138 mmol/L (ref 134–144)
Total Protein: 6.7 g/dL (ref 6.0–8.5)

## 2020-05-19 LAB — CBC
Hematocrit: 42.2 % (ref 34.0–46.6)
Hemoglobin: 14.7 g/dL (ref 11.1–15.9)
MCH: 32.1 pg (ref 26.6–33.0)
MCHC: 34.8 g/dL (ref 31.5–35.7)
MCV: 92 fL (ref 79–97)
Platelets: 208 10*3/uL (ref 150–450)
RBC: 4.58 x10E6/uL (ref 3.77–5.28)
RDW: 12.6 % (ref 11.7–15.4)
WBC: 6.4 10*3/uL (ref 3.4–10.8)

## 2020-05-19 LAB — SEDIMENTATION RATE: Sed Rate: 30 mm/hr (ref 0–32)

## 2020-05-19 LAB — T4, FREE: Free T4: 1.39 ng/dL (ref 0.82–1.77)

## 2020-05-19 LAB — VITAMIN D 25 HYDROXY (VIT D DEFICIENCY, FRACTURES): Vit D, 25-Hydroxy: 37 ng/mL (ref 30.0–100.0)

## 2020-05-19 LAB — ANA W/REFLEX IF POSITIVE: Anti Nuclear Antibody (ANA): NEGATIVE

## 2020-05-19 LAB — TSH: TSH: 1.06 u[IU]/mL (ref 0.450–4.500)

## 2020-09-13 ENCOUNTER — Ambulatory Visit (INDEPENDENT_AMBULATORY_CARE_PROVIDER_SITE_OTHER): Payer: Commercial Managed Care - PPO | Admitting: Nurse Practitioner

## 2020-09-13 ENCOUNTER — Encounter: Payer: Self-pay | Admitting: Nurse Practitioner

## 2020-09-13 ENCOUNTER — Other Ambulatory Visit: Payer: Self-pay

## 2020-09-13 VITALS — BP 109/89 | HR 100 | Temp 97.6°F | Resp 16 | Ht 61.5 in | Wt 157.6 lb

## 2020-09-13 DIAGNOSIS — I10 Essential (primary) hypertension: Secondary | ICD-10-CM | POA: Diagnosis not present

## 2020-09-13 DIAGNOSIS — Z0001 Encounter for general adult medical examination with abnormal findings: Secondary | ICD-10-CM

## 2020-09-13 DIAGNOSIS — Z124 Encounter for screening for malignant neoplasm of cervix: Secondary | ICD-10-CM

## 2020-09-13 DIAGNOSIS — R300 Dysuria: Secondary | ICD-10-CM

## 2020-09-13 DIAGNOSIS — Z1231 Encounter for screening mammogram for malignant neoplasm of breast: Secondary | ICD-10-CM

## 2020-09-13 DIAGNOSIS — M150 Primary generalized (osteo)arthritis: Secondary | ICD-10-CM

## 2020-09-13 DIAGNOSIS — E782 Mixed hyperlipidemia: Secondary | ICD-10-CM | POA: Diagnosis not present

## 2020-09-13 MED ORDER — MELOXICAM 15 MG PO TABS: 15.0000 mg | ORAL_TABLET | Freq: Every day | ORAL | 3 refills | Status: DC

## 2020-09-13 NOTE — Progress Notes (Signed)
[facility] [address] [place], [place] [address]  Internal MEDICINE  Office Visit Note  Patient Name: Alexandria Murray  [id number]  [id number]  Date of Service: 10/10/2020   Pt is here for routine health maintenance examination   Chief Complaint  Patient presents with  . Annual Exam    needs to change medication  . controlled substance form    reviewed with PT     The patient is here for health maintenance exam. Left arm continues to have pain and weakness since she suffered from [place] berret syndrome. She takes meloxicam to treat pain and inflammation and tramadol as needed for more severe pain.  She finds that, recently, she is only having to take the tramadol once before work. Overall, she is doing better with pain control. She needs to have refills for this. Blood pressure is doing very well on low dose lisinopril.  She had routine, fasting labs done in 04/2020. She had mildly elevated LDL and mildly decreased HDL with LDL/HDL ratio of 3.0.  She does take Zetia 10mg  daily as she has been intolerant to statin medications in the past.  She is due to have screening mammogram.   Current Medication: Outpatient Encounter Medications as of 09/13/2020  Medication Sig Note  . acetaminophen (TYLENOL) 500 MG tablet Take 1,000 mg by mouth every 6 (six) hours as needed for mild pain.   . Aspirin-Acetaminophen-Caffeine (PAMPRIN MAX PO) Take 2 tablets by mouth daily.   09/15/2020 docusate sodium (COLACE) 100 MG capsule Take 1 capsule (100 mg total) by mouth 2 (two) times daily.   . ergocalciferol (DRISDOL) 1.25 MG (50000 UT) capsule Take 1 capsule (50,000 Units total) by mouth once a week.   . escitalopram (LEXAPRO) 10 MG tablet Take 1 tablet (10 mg total) by mouth daily.   Marland Kitchen ezetimibe (ZETIA) 10 MG tablet Take 1 tablet (10 mg total) by mouth daily.   . ferrous sulfate 325 (65 FE) MG tablet Take 1 tablet (325 mg total) by mouth daily.   . folic acid (FOLVITE) 1 MG tablet Take 1 tablet  (1 mg total) by mouth daily.   Marland Kitchen gabapentin (NEURONTIN) 100 MG capsule Take 1 capsule (100 mg total) by mouth 2 (two) times daily.   Marland Kitchen lisinopril (ZESTRIL) 2.5 MG tablet Take 2 tablets (5 mg total) by mouth daily.   . Multiple Vitamin (MULTIVITAMIN WITH MINERALS) TABS tablet Take 1 tablet by mouth daily.   . Multiple Vitamins-Minerals (MULTIVITAMIN PO) Take 1 tablet by mouth daily.   . traMADol (ULTRAM) 50 MG tablet Take 2 tablets (100 mg total) by mouth every 12 (twelve) hours as needed for moderate pain.   . traZODone (DESYREL) 100 MG tablet Take 1 tablet (100 mg total) by mouth at bedtime as needed for sleep.   . [DISCONTINUED] etodolac (LODINE) 400 MG tablet Take 1 tablet (400 mg total) by mouth 2 (two) times daily. 09/13/2020: go back to meloxicam  . [DISCONTINUED] ondansetron (ZOFRAN-ODT) 4 MG disintegrating tablet Take 1 tablet (4 mg total) by mouth every 8 (eight) hours as needed for nausea or vomiting.   . meloxicam (MOBIC) 15 MG tablet Take 1 tablet (15 mg total) by mouth daily.    No facility-administered encounter medications on file as of 09/13/2020.    Surgical History: Past Surgical History:  Procedure Laterality Date  . CHOLECYSTECTOMY    . COLPOSCOPY  07/26/2015  . FINGER SURGERY      Medical History: Past Medical History:  Diagnosis  Date  . Abnormal Pap smear of cervix 06/20/2015  . Anxiety   . 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 Guillain-Barre syndrome (HCC)   . Nicotine dependence   . Stroke (HCC)   . Torn rotator cuff     Family History: Family History  Problem Relation Age of Onset  . Cancer Mother   . Stroke Paternal Grandfather       Review of Systems  Constitutional: Negative for activity change, chills, fatigue and unexpected weight change.  HENT: Negative for congestion, postnasal drip, rhinorrhea, sneezing and sore throat.   Respiratory: Negative for cough,  chest tightness, shortness of breath and wheezing.   Cardiovascular: Negative for chest pain and palpitations.       Blood pressure well managed.   Gastrointestinal: Negative for abdominal pain, constipation, diarrhea, nausea and vomiting.  Endocrine: Negative for cold intolerance, heat intolerance, polydipsia and polyuria.  Genitourinary: Negative for dysuria and frequency.  Musculoskeletal: Positive for arthralgias, back pain and myalgias. Negative for joint swelling and neck pain.  Skin: Negative for rash.  Allergic/Immunologic: Negative for environmental allergies.  Neurological: Negative for dizziness, tremors, numbness and headaches.  Hematological: Negative for adenopathy. Does not bruise/bleed easily.  Psychiatric/Behavioral: Negative for behavioral problems (Depression), sleep disturbance and suicidal ideas. The patient is not nervous/anxious.     Today's Vitals   09/13/20 1532  BP: 109/89  Pulse: 100  Resp: 16  Temp: 97.6 F (36.4 C)  SpO2: 98%  Weight: 157 lb 9.6 oz (71.5 kg)  Height: 5' 1.5" (1.562 m)   Body mass index is 29.3 kg/m.  Physical Exam Vitals and nursing note reviewed.  Constitutional:      Appearance: Normal appearance. She is well-developed.  HENT:     Head: Normocephalic and atraumatic.  Eyes:     Pupils: Pupils are equal, round, and reactive to light.  Cardiovascular:     Rate and Rhythm: Normal rate and regular rhythm.     Pulses: Normal pulses.     Heart sounds: Normal heart sounds.  Pulmonary:     Effort: Pulmonary effort is normal.     Breath sounds: Normal breath sounds. No wheezing.  Abdominal:     General: Bowel sounds are normal.     Palpations: Abdomen is soft.     Tenderness: There is no abdominal tenderness.  Musculoskeletal:        General: Normal range of motion.     Cervical back: Normal range of motion and neck supple.     Comments: Chronic tendernes in left shoulder and arm as well as pain in both hands. ROM and strength  are at their baseline in left arm. ROM and strength are intact in both hands.   Lymphadenopathy:     Cervical: No cervical adenopathy.  Skin:    General: Skin is warm and dry.     Capillary Refill: Capillary refill takes 2 to 3 seconds.  Neurological:     General: No focal deficit present.     Mental Status: She is alert and oriented to person, place, and time.  Psychiatric:        Mood and Affect: Mood normal.        Behavior: Behavior normal.        Thought Content: Thought content normal.        Judgment: Judgment normal.    LABS: Recent Results (from the past 2160 hour(s))  UA/M w/rflx Culture, Routine     Status: None   Collection Time: 09/13/20  3:30 PM   Specimen: Urine   Urine  Result Value Ref Range   Specific Gravity, UA 1.013 1.005 - 1.030   pH, UA 5.5 5.0 - 7.5   Color, UA Yellow Yellow   Appearance Ur Clear Clear   Leukocytes,UA Negative Negative   Protein,UA Trace Negative/Trace   Glucose, UA Negative Negative   Ketones, UA Negative Negative   RBC, UA Negative Negative   Bilirubin, UA Negative Negative   Urobilinogen, Ur 0.2 0.2 - 1.0 mg/dL   Nitrite, UA Negative Negative   Microscopic Examination See below:     Comment: Microscopic was indicated and was performed.   Urinalysis Reflex Comment     Comment: This specimen will not reflex to a Urine Culture.  Microscopic Examination     Status: None   Collection Time: 09/13/20  3:30 PM   Urine  Result Value Ref Range   WBC, UA 0-5 0 - 5 /hpf   RBC None seen 0 - 2 /hpf   Epithelial Cells (non renal) 0-10 0 - 10 /hpf   Casts None seen None seen /lpf   Bacteria, UA None seen None seen/Few    Assessment/Plan:  1. Encounter for general adult medical examination with abnormal findings Annual health maintenance exam today.   2. Essential hypertension Stable. Continue lisinpril as prescribed  3. Mixed hyperlipidemia Lipid panel stable. Continue zetia as presviously prescribed   4. Primary generalized  (osteo)arthritis Continue meloxicam 15mg  daily as needed for pain and inflammation. May take tramadol as previously prescribed and as needed.  - meloxicam (MOBIC) 15 MG tablet; Take 1 tablet (15 mg total) by mouth daily.  Dispense: 30 tablet; Refill: 3  5. Dysuria - UA/M w/rflx Culture, Routine  6. Encounter for screening mammogram for malignant neoplasm of breast - MM DIGITAL SCREENING BILATERAL; Future   General Counseling: Maddy verbalizes understanding of the findings of todays visit and agrees with plan of treatment. I have discussed any further diagnostic evaluation that may be needed or ordered today. We also reviewed her medications today. she has been encouraged to call the office with any questions or concerns that should arise related to todays visit.    Counseling:  This patient was seen by FNP Collaboration with Dr Vincent Gros as a part of collaborative care agreement  Orders Placed This Encounter  Procedures  . Microscopic Examination  . MM DIGITAL SCREENING BILATERAL  . UA/M w/rflx Culture, Routine    Meds ordered this encounter  Medications  . meloxicam (MOBIC) 15 MG tablet    Sig: Take 1 tablet (15 mg total) by mouth daily.    Dispense:  30 tablet    Refill:  3    Please d/c etodolac due to negative side effects    Order Specific Question:   Supervising Provider    Answer:   Lyndon Code [[id number]]    Total time spent: 45 Minutes  Time spent includes review of chart, medications, test results, and follow up plan with the patient.     Lyndon Code, MD  Internal Medicine

## 2020-09-14 LAB — MICROSCOPIC EXAMINATION
Bacteria, UA: NONE SEEN
Casts: NONE SEEN /lpf
RBC, Urine: NONE SEEN /hpf (ref 0–2)

## 2020-09-14 LAB — UA/M W/RFLX CULTURE, ROUTINE
Bilirubin, UA: NEGATIVE
Glucose, UA: NEGATIVE
Ketones, UA: NEGATIVE
Leukocytes,UA: NEGATIVE
Nitrite, UA: NEGATIVE
RBC, UA: NEGATIVE
Specific Gravity, UA: 1.013 (ref 1.005–1.030)
Urobilinogen, Ur: 0.2 mg/dL (ref 0.2–1.0)
pH, UA: 5.5 (ref 5.0–7.5)

## 2020-09-25 ENCOUNTER — Other Ambulatory Visit: Payer: Self-pay

## 2020-09-25 DIAGNOSIS — R110 Nausea: Secondary | ICD-10-CM

## 2020-09-25 MED ORDER — ONDANSETRON 4 MG PO TBDP: 4.0000 mg | ORAL_TABLET | Freq: Three times a day (TID) | ORAL | 3 refills | Status: DC | PRN

## 2020-10-10 DIAGNOSIS — Z1231 Encounter for screening mammogram for malignant neoplasm of breast: Secondary | ICD-10-CM | POA: Insufficient documentation

## 2020-10-10 DIAGNOSIS — Z0001 Encounter for general adult medical examination with abnormal findings: Secondary | ICD-10-CM | POA: Insufficient documentation

## 2020-11-08 ENCOUNTER — Other Ambulatory Visit: Payer: Self-pay

## 2020-11-08 DIAGNOSIS — G610 Guillain-Barre syndrome: Secondary | ICD-10-CM

## 2020-11-08 DIAGNOSIS — M150 Primary generalized (osteo)arthritis: Secondary | ICD-10-CM

## 2020-11-08 DIAGNOSIS — D509 Iron deficiency anemia, unspecified: Secondary | ICD-10-CM

## 2020-11-08 MED ORDER — GABAPENTIN 100 MG PO CAPS: 100.0000 mg | ORAL_CAPSULE | Freq: Two times a day (BID) | ORAL | 3 refills | Status: DC

## 2020-11-08 MED ORDER — FOLIC ACID 1 MG PO TABS: 1.0000 mg | ORAL_TABLET | Freq: Every day | ORAL | 3 refills | Status: DC

## 2020-12-17 ENCOUNTER — Other Ambulatory Visit: Payer: Self-pay | Admitting: Nurse Practitioner

## 2020-12-17 DIAGNOSIS — I10 Essential (primary) hypertension: Secondary | ICD-10-CM

## 2020-12-17 DIAGNOSIS — M150 Primary generalized (osteo)arthritis: Secondary | ICD-10-CM

## 2020-12-18 ENCOUNTER — Other Ambulatory Visit: Payer: Self-pay

## 2020-12-18 DIAGNOSIS — I10 Essential (primary) hypertension: Secondary | ICD-10-CM

## 2020-12-18 MED ORDER — LISINOPRIL 2.5 MG PO TABS: 5.0000 mg | ORAL_TABLET | Freq: Every day | ORAL | 3 refills | Status: DC

## 2020-12-19 ENCOUNTER — Other Ambulatory Visit: Payer: Self-pay | Admitting: Adult Health

## 2020-12-19 DIAGNOSIS — M150 Primary generalized (osteo)arthritis: Secondary | ICD-10-CM

## 2020-12-22 ENCOUNTER — Other Ambulatory Visit: Payer: Self-pay | Admitting: Nurse Practitioner

## 2020-12-22 DIAGNOSIS — M150 Primary generalized (osteo)arthritis: Secondary | ICD-10-CM

## 2021-01-03 ENCOUNTER — Other Ambulatory Visit: Payer: Self-pay | Admitting: Nurse Practitioner

## 2021-01-03 DIAGNOSIS — M150 Primary generalized (osteo)arthritis: Secondary | ICD-10-CM

## 2021-01-06 ENCOUNTER — Other Ambulatory Visit: Payer: Self-pay | Admitting: Nurse Practitioner

## 2021-01-06 DIAGNOSIS — M150 Primary generalized (osteo)arthritis: Secondary | ICD-10-CM

## 2021-01-10 ENCOUNTER — Ambulatory Visit: Payer: Commercial Managed Care - PPO | Admitting: Hospice and Palliative Medicine

## 2021-01-15 ENCOUNTER — Encounter: Payer: Self-pay | Admitting: Hospice and Palliative Medicine

## 2021-01-15 ENCOUNTER — Other Ambulatory Visit: Payer: Self-pay

## 2021-01-15 ENCOUNTER — Ambulatory Visit: Payer: Commercial Managed Care - PPO | Admitting: Hospice and Palliative Medicine

## 2021-01-15 VITALS — BP 100/70 | HR 91 | Temp 97.6°F | Resp 16 | Ht 60.0 in | Wt 144.6 lb

## 2021-01-15 DIAGNOSIS — R634 Abnormal weight loss: Secondary | ICD-10-CM

## 2021-01-15 DIAGNOSIS — R5383 Other fatigue: Secondary | ICD-10-CM

## 2021-01-15 DIAGNOSIS — D509 Iron deficiency anemia, unspecified: Secondary | ICD-10-CM

## 2021-01-15 DIAGNOSIS — I10 Essential (primary) hypertension: Secondary | ICD-10-CM

## 2021-01-15 DIAGNOSIS — E782 Mixed hyperlipidemia: Secondary | ICD-10-CM

## 2021-01-15 DIAGNOSIS — N8110 Cystocele, unspecified: Secondary | ICD-10-CM | POA: Diagnosis not present

## 2021-01-15 DIAGNOSIS — K429 Umbilical hernia without obstruction or gangrene: Secondary | ICD-10-CM | POA: Diagnosis not present

## 2021-01-15 DIAGNOSIS — G610 Guillain-Barre syndrome: Secondary | ICD-10-CM

## 2021-01-15 DIAGNOSIS — M150 Primary generalized (osteo)arthritis: Secondary | ICD-10-CM

## 2021-01-15 DIAGNOSIS — R110 Nausea: Secondary | ICD-10-CM

## 2021-01-15 DIAGNOSIS — Z1231 Encounter for screening mammogram for malignant neoplasm of breast: Secondary | ICD-10-CM

## 2021-01-15 MED ORDER — LISINOPRIL 2.5 MG PO TABS: 5.0000 mg | ORAL_TABLET | Freq: Every day | ORAL | 3 refills | Status: DC

## 2021-01-15 MED ORDER — FOLIC ACID 1 MG PO TABS: 1.0000 mg | ORAL_TABLET | Freq: Every day | ORAL | 3 refills | Status: DC

## 2021-01-15 MED ORDER — TRAMADOL HCL 50 MG PO TABS
100.0000 mg | ORAL_TABLET | Freq: Two times a day (BID) | ORAL | 3 refills | Status: DC | PRN
Start: 2021-01-15 — End: 2021-06-03

## 2021-01-15 MED ORDER — MELOXICAM 15 MG PO TABS: 15.0000 mg | ORAL_TABLET | Freq: Every day | ORAL | 3 refills | Status: DC

## 2021-01-15 MED ORDER — ONDANSETRON 4 MG PO TBDP: 4.0000 mg | ORAL_TABLET | Freq: Three times a day (TID) | ORAL | 3 refills | Status: DC | PRN

## 2021-01-15 MED ORDER — GABAPENTIN 100 MG PO CAPS: 100.0000 mg | ORAL_CAPSULE | Freq: Two times a day (BID) | ORAL | 3 refills | Status: DC

## 2021-01-15 MED ORDER — EZETIMIBE 10 MG PO TABS
10.0000 mg | ORAL_TABLET | Freq: Every day | ORAL | 3 refills | Status: DC
Start: 2021-01-15 — End: 2021-07-11

## 2021-01-15 NOTE — Progress Notes (Signed)
[facility] [address] [place], [place] [address]  Internal MEDICINE  Office Visit Note  Patient Name: Alexandria Murray  [id number]  [id number]  Date of Service: 01/16/2021  Chief Complaint  Patient presents with  . Follow-up    Lost a lot of weight in a short amount of time, refill request, not fully emptying bladder, hernia might be getting bigger  . Anemia    Refill request  . 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 acetaminophen (TYLENOL) 500 MG tablet Take 1,000 mg by mouth every 6 (six) hours as needed for mild pain.  . Aspirin-Acetaminophen-Caffeine (PAMPRIN MAX PO) Take 2 tablets by mouth daily.  Marland Kitchen. docusate sodium (COLACE) 100 MG capsule Take 1 capsule (100 mg total) by mouth 2 (two) times daily.  Marland Kitchen. ezetimibe (ZETIA) 10 MG tablet Take 1 tablet (10 mg total) by mouth daily.  . ferrous sulfate 325 (65 FE) MG tablet Take 1 tablet (325 mg total) by mouth daily.  . folic acid (FOLVITE) 1 MG  tablet Take 1 tablet (1 mg total) by mouth daily.  Marland Kitchen. gabapentin (NEURONTIN) 100 MG capsule Take 1 capsule (100 mg total) by mouth 2 (two) times daily.  Marland Kitchen. lisinopril (ZESTRIL) 2.5 MG tablet Take 2 tablets (5 mg total) by mouth daily.  . meloxicam (MOBIC) 15 MG tablet Take 1 tablet (15 mg total) by mouth daily.  . Multiple Vitamin (MULTIVITAMIN WITH MINERALS) TABS tablet Take 1 tablet by mouth daily.  . Multiple Vitamins-Minerals (MULTIVITAMIN PO) Take 1 tablet by mouth daily.  . ondansetron (ZOFRAN-ODT) 4 MG disintegrating tablet Take 1 tablet (4 mg total) by mouth every 8 (eight) hours as needed for nausea or vomiting.  . traMADol (ULTRAM) 50 MG tablet Take 2 tablets (100 mg total) by mouth every 12 (twelve) hours as needed for moderate pain.  . traZODone (DESYREL) 100 MG tablet Take 1 tablet (100 mg total) by mouth at bedtime as needed for sleep.  . [DISCONTINUED] ergocalciferol (DRISDOL) 1.25 MG (50000 UT) capsule Take 1 capsule (50,000 Units total) by mouth once a week.  . [DISCONTINUED] escitalopram (LEXAPRO) 10 MG tablet Take 1 tablet (10 mg total) by mouth daily.  . [DISCONTINUED] ezetimibe (ZETIA) 10 MG tablet Take 1 tablet (10 mg total) by mouth daily.  . [DISCONTINUED] folic acid (FOLVITE) 1 MG tablet Take 1 tablet (1 mg total) by mouth daily.  . [DISCONTINUED] gabapentin (NEURONTIN) 100 MG capsule Take 1 capsule (100 mg total) by mouth 2 (two) times daily.  . [DISCONTINUED] lisinopril (  ZESTRIL) 2.5 MG tablet Take 2 tablets (5 mg total) by mouth daily.  . [DISCONTINUED] meloxicam (MOBIC) 15 MG tablet Take 1 tablet (15 mg total) by mouth daily.  . [DISCONTINUED] ondansetron (ZOFRAN-ODT) 4 MG disintegrating tablet Take 1 tablet (4 mg total) by mouth every 8 (eight) hours as needed for nausea or vomiting.  . [DISCONTINUED] traMADol (ULTRAM) 50 MG tablet Take 2 tablets (100 mg total) by mouth every 12 (twelve) hours as needed for moderate pain.   No facility-administered encounter medications  on file as of 01/15/2021.    Surgical History: Past Surgical History:  Procedure Laterality Date  . CHOLECYSTECTOMY    . COLPOSCOPY  07/26/2015  . FINGER SURGERY      Medical History: Past Medical History:  Diagnosis Date  . Abnormal Pap smear of cervix 06/20/2015  . Anxiety   . 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 Guillain-Barre syndrome (HCC)   . Nicotine dependence   . Stroke (HCC)   . Torn rotator cuff     Family History: Family History  Problem Relation Age of Onset  . Cancer Mother   . Stroke Paternal Grandfather     Social History   Socioeconomic History  . Marital status: Married    Spouse name: Not on file  . Number of children: Not on file  . Years of education: Not on file  . Highest education level: Not on file  Occupational History  . Not on file  Tobacco Use  . Smoking status: Current Every Day Smoker    Packs/day: 2.00    Types: Cigarettes  . Smokeless tobacco: Never Used  Vaping Use  . Vaping Use: Never used  Substance and Sexual Activity  . Alcohol use: No  . Drug use: No  . Sexual activity: Yes    Birth control/protection: Post-menopausal  Other Topics Concern  . Not on file  Social History Narrative  . Not on file   Social Determinants of Health   Financial Resource Strain: Not on file  Food Insecurity: Not on file  Transportation Needs: Not on file  Physical Activity: Not on file  Stress: Not on file  Social Connections: Not on file  Intimate Partner Violence: Not on file      Review of Systems  Constitutional: Positive for unexpected weight change. Negative for chills, diaphoresis and fatigue.       Dry skin, brittle nails and hair  HENT: Negative for ear pain, postnasal drip and sinus pressure.   Eyes: Negative for photophobia, discharge, redness, itching and visual disturbance.  Respiratory: Negative for cough, shortness of breath and  wheezing.   Cardiovascular: Negative for chest pain, palpitations and leg swelling.  Gastrointestinal: Negative for abdominal pain, constipation, diarrhea, nausea and vomiting.       Umbilical hernia  Genitourinary: Negative for dysuria and flank pain.       Prolapsed bladder  Musculoskeletal: Negative for arthralgias, back pain, gait problem and neck pain.  Skin: Negative for color change.  Allergic/Immunologic: Negative for environmental allergies and food allergies.  Neurological: Negative for dizziness and headaches.  Hematological: Does not bruise/bleed easily.  Psychiatric/Behavioral: Negative for agitation, behavioral problems (depression) and hallucinations.    Vital Signs: BP 100/70   Pulse 91   Temp 97.6 F (36.4 C)   Resp 16   Ht 5' (1.524 m)   Wt 144 lb 9.6 oz (65.6 kg)   SpO2  98%   BMI 28.24 kg/m    Physical Exam Vitals reviewed.  Constitutional:      Appearance: Normal appearance. She is normal weight.  Cardiovascular:     Rate and Rhythm: Normal rate and regular rhythm.     Pulses: Normal pulses.     Heart sounds: Normal heart sounds.  Pulmonary:     Effort: Pulmonary effort is normal.     Breath sounds: Normal breath sounds.  Abdominal:     Hernia: A hernia is present. Hernia is present in the umbilical area.  Musculoskeletal:        General: Normal range of motion.     Cervical back: Normal range of motion.  Skin:    General: Skin is warm and dry.  Neurological:     General: No focal deficit present.     Mental Status: She is alert and oriented to person, place, and time. Mental status is at baseline.  Psychiatric:        Mood and Affect: Mood normal.        Behavior: Behavior normal.        Thought Content: Thought content normal.        Judgment: Judgment normal.    Assessment/Plan: 1. Female bladder prolapse Symptoms worsening, referral placed to urology - Ambulatory referral to Urology  2. Umbilical hernia without obstruction and  without gangrene Referral to general surgery for further evaluation and possible surgical removal - Ambulatory referral to General Surgery  3. Encounter for screening mammogram for malignant neoplasm of breast - MM Digital Screening; Future  4. Unintentional weight loss Review labs/thyroid levels--if stable will need further work-up Colonoscopy//low dose chest CT for positive smoking history - CBC w/Diff/Platelet - Comprehensive Metabolic Panel (CMET) - TSH + free T4  5. Essential hypertension BP and HR well controlled, requesting refills - lisinopril (ZESTRIL) 2.5 MG tablet; Take 2 tablets (5 mg total) by mouth daily.  Dispense: 30 tablet; Refill: 3  6. Mixed hyperlipidemia Review updated labs and adjust as indicated Requesting refills, unable to tolerate statin int he past - Lipid Panel With LDL/HDL Ratio - ezetimibe (ZETIA) 10 MG tablet; Take 1 tablet (10 mg total) by mouth daily.  Dispense: 30 tablet; Refill: 3  7. Guillain Barr syndrome (HCC) Stable, pain remains well controlled with current dosing of gabapentin, requesting refills - gabapentin (NEURONTIN) 100 MG capsule; Take 1 capsule (100 mg total) by mouth 2 (two) times daily.  Dispense: 60 capsule; Refill: 3  8. Primary generalized (osteo)arthritis Pain remains well controlled with current therapy, requesting refills [place] Controlled Substance Database was reviewed by me for overdose risk score (ORS) Reviewed risks and possible side effects associated with taking opiates, benzodiazepines and other CNS depressants. Combination of these could cause dizziness and drowsiness. Advised patient not to drive or operate machinery when taking these medications, as patient's and other's life can be at risk and will have consequences. Patient verbalized understanding in this matter. Dependence and abuse for these drugs will be monitored closely. A Controlled substance policy and procedure is on file which allows [facility] to  order a urine drug screen test at any visit. Patient understands and agrees with the plan - traMADol (ULTRAM) 50 MG tablet; Take 2 tablets (100 mg total) by mouth every 12 (twelve) hours as needed for moderate pain.  Dispense: 120 tablet; Refill: 3 - meloxicam (MOBIC) 15 MG tablet; Take 1 tablet (15 mg total) by mouth daily.  Dispense: 30 tablet; Refill: 3 - gabapentin (  NEURONTIN) 100 MG capsule; Take 1 capsule (100 mg total) by mouth 2 (two) times daily.  Dispense: 60 capsule; Refill: 3  9. Iron deficiency anemia, unspecified iron deficiency anemia type Update labs and adjust plan as indicated, requesting refills - folic acid (FOLVITE) 1 MG tablet; Take 1 tablet (1 mg total) by mouth daily.  Dispense: 30 tablet; Refill: 3  10. Nausea Stable, requesting refills - ondansetron (ZOFRAN-ODT) 4 MG disintegrating tablet; Take 1 tablet (4 mg total) by mouth every 8 (eight) hours as needed for nausea or vomiting.  Dispense: 30 tablet; Refill: 3  11. Other fatigue - CBC w/Diff/Platelet - Comprehensive Metabolic Panel (CMET) - Lipid Panel With LDL/HDL Ratio - TSH + free T4 - Vitamin D (25 hydroxy)  General Counseling: Lovena verbalizes understanding of the findings of todays visit and agrees with plan of treatment. I have discussed any further diagnostic evaluation that may be needed or ordered today. We also reviewed her medications today. she has been encouraged to call the office with any questions or concerns that should arise related to todays visit.    Orders Placed This Encounter  Procedures  . MM Digital Screening  . CBC w/Diff/Platelet  . Comprehensive Metabolic Panel (CMET)  . Lipid Panel With LDL/HDL Ratio  . TSH + free T4  . Vitamin D (25 hydroxy)  . Ambulatory referral to Urology  . Ambulatory referral to General Surgery    Meds ordered this encounter  Medications  . traMADol (ULTRAM) 50 MG tablet    Sig: Take 2 tablets (100 mg total) by mouth every 12 (twelve) hours as needed  for moderate pain.    Dispense:  120 tablet    Refill:  3  . ondansetron (ZOFRAN-ODT) 4 MG disintegrating tablet    Sig: Take 1 tablet (4 mg total) by mouth every 8 (eight) hours as needed for nausea or vomiting.    Dispense:  30 tablet    Refill:  3  . meloxicam (MOBIC) 15 MG tablet    Sig: Take 1 tablet (15 mg total) by mouth daily.    Dispense:  30 tablet    Refill:  3    Please d/c etodolac due to negative side effects  . lisinopril (ZESTRIL) 2.5 MG tablet    Sig: Take 2 tablets (5 mg total) by mouth daily.    Dispense:  30 tablet    Refill:  3    Please note decreased dose  . gabapentin (NEURONTIN) 100 MG capsule    Sig: Take 1 capsule (100 mg total) by mouth 2 (two) times daily.    Dispense:  60 capsule    Refill:  3  . folic acid (FOLVITE) 1 MG tablet    Sig: Take 1 tablet (1 mg total) by mouth daily.    Dispense:  30 tablet    Refill:  3  . ezetimibe (ZETIA) 10 MG tablet    Sig: Take 1 tablet (10 mg total) by mouth daily.    Dispense:  30 tablet    Refill:  3    High cholesterol and history of stroke. Statins have caused excess joint and muscle pain.    Time spent: 30 Minutes Time spent includes review of chart, medications, test results and follow-up plan with the patient.  This patient was seen by Leeanne Deed AGNP-C in Collaboration with Dr Lyndon Code as a part of collaborative care agreement     Lubertha Basque. Paris Hohn AGNP-C Internal medicine

## 2021-01-16 ENCOUNTER — Encounter: Payer: Self-pay | Admitting: Hospice and Palliative Medicine

## 2021-01-30 ENCOUNTER — Ambulatory Visit: Payer: Self-pay | Admitting: Urology

## 2021-02-02 LAB — COMPREHENSIVE METABOLIC PANEL
ALT: 10 IU/L (ref 0–32)
AST: 14 IU/L (ref 0–40)
Albumin/Globulin Ratio: 1.8 (ref 1.2–2.2)
Albumin: 4.5 g/dL (ref 3.8–4.8)
Alkaline Phosphatase: 98 IU/L (ref 44–121)
BUN/Creatinine Ratio: 29 — ABNORMAL HIGH (ref 9–23)
BUN: 23 mg/dL (ref 6–24)
Bilirubin Total: 0.4 mg/dL (ref 0.0–1.2)
CO2: 21 mmol/L (ref 20–29)
Calcium: 10.1 mg/dL (ref 8.7–10.2)
Chloride: 104 mmol/L (ref 96–106)
Creatinine, Ser: 0.79 mg/dL (ref 0.57–1.00)
Globulin, Total: 2.5 g/dL (ref 1.5–4.5)
Glucose: 101 mg/dL — ABNORMAL HIGH (ref 65–99)
Potassium: 5.3 mmol/L — ABNORMAL HIGH (ref 3.5–5.2)
Sodium: 141 mmol/L (ref 134–144)
Total Protein: 7 g/dL (ref 6.0–8.5)
eGFR: 93 mL/min/{1.73_m2} (ref >59)

## 2021-02-02 LAB — CBC WITH DIFFERENTIAL/PLATELET
Basophils Absolute: 0.1 10*3/uL (ref 0.0–0.2)
Basos: 1 %
EOS (ABSOLUTE): 0.2 10*3/uL (ref 0.0–0.4)
Eos: 2 %
Hematocrit: 47.3 % — ABNORMAL HIGH (ref 34.0–46.6)
Hemoglobin: 16.2 g/dL — ABNORMAL HIGH (ref 11.1–15.9)
Immature Grans (Abs): 0.1 10*3/uL (ref 0.0–0.1)
Immature Granulocytes: 1 %
Lymphocytes Absolute: 3 10*3/uL (ref 0.7–3.1)
Lymphs: 29 %
MCH: 31.9 pg (ref 26.6–33.0)
MCHC: 34.2 g/dL (ref 31.5–35.7)
MCV: 93 fL (ref 79–97)
Monocytes Absolute: 0.5 10*3/uL (ref 0.1–0.9)
Monocytes: 5 %
Neutrophils Absolute: 6.7 10*3/uL (ref 1.4–7.0)
Neutrophils: 62 %
Platelets: 305 10*3/uL (ref 150–450)
RBC: 5.08 x10E6/uL (ref 3.77–5.28)
RDW: 12.7 % (ref 11.7–15.4)
WBC: 10.6 10*3/uL (ref 3.4–10.8)

## 2021-02-02 LAB — LIPID PANEL WITH LDL/HDL RATIO
Cholesterol, Total: 209 mg/dL — ABNORMAL HIGH (ref 100–199)
HDL: 44 mg/dL (ref >39)
LDL Chol Calc (NIH): 132 mg/dL — ABNORMAL HIGH (ref 0–99)
LDL/HDL Ratio: 3 ratio (ref 0.0–3.2)
Triglycerides: 182 mg/dL — ABNORMAL HIGH (ref 0–149)
VLDL Cholesterol Cal: 33 mg/dL (ref 5–40)

## 2021-02-02 LAB — TSH+FREE T4
Free T4: 1.29 ng/dL (ref 0.82–1.77)
TSH: 1 u[IU]/mL (ref 0.450–4.500)

## 2021-02-02 LAB — VITAMIN D 25 HYDROXY (VIT D DEFICIENCY, FRACTURES): Vit D, 25-Hydroxy: 14.6 ng/mL — ABNORMAL LOW (ref 30.0–100.0)

## 2021-02-04 NOTE — Progress Notes (Signed)
Labs reviewed, will need to get started on Vitamin D, look into elevated Hbg and Hct and potassium.

## 2021-02-06 ENCOUNTER — Telehealth: Payer: Self-pay

## 2021-02-06 NOTE — Telephone Encounter (Signed)
Faxed referral over to [facility] general surgery at [phone]. Toni Amend

## 2021-02-12 ENCOUNTER — Other Ambulatory Visit: Payer: Self-pay | Admitting: Surgery

## 2021-02-12 DIAGNOSIS — K420 Umbilical hernia with obstruction, without gangrene: Secondary | ICD-10-CM

## 2021-02-13 ENCOUNTER — Other Ambulatory Visit: Payer: Self-pay

## 2021-02-13 ENCOUNTER — Encounter: Payer: Self-pay | Admitting: Hospice and Palliative Medicine

## 2021-02-13 ENCOUNTER — Ambulatory Visit: Payer: Commercial Managed Care - PPO | Admitting: Hospice and Palliative Medicine

## 2021-02-13 VITALS — BP 111/72 | HR 79 | Temp 98.2°F | Resp 16 | Ht 60.0 in | Wt 142.0 lb

## 2021-02-13 DIAGNOSIS — G479 Sleep disorder, unspecified: Secondary | ICD-10-CM

## 2021-02-13 DIAGNOSIS — E559 Vitamin D deficiency, unspecified: Secondary | ICD-10-CM

## 2021-02-13 DIAGNOSIS — I10 Essential (primary) hypertension: Secondary | ICD-10-CM | POA: Diagnosis not present

## 2021-02-13 DIAGNOSIS — Z122 Encounter for screening for malignant neoplasm of respiratory organs: Secondary | ICD-10-CM

## 2021-02-13 DIAGNOSIS — D751 Secondary polycythemia: Secondary | ICD-10-CM | POA: Diagnosis not present

## 2021-02-13 DIAGNOSIS — E782 Mixed hyperlipidemia: Secondary | ICD-10-CM

## 2021-02-13 MED ORDER — VITAMIN D (ERGOCALCIFEROL) 1.25 MG (50000 UNIT) PO CAPS: 50000.0000 [IU] | ORAL_CAPSULE | ORAL | 0 refills | Status: DC

## 2021-02-13 NOTE — Progress Notes (Signed)
[facility] [address] [place], [place] [address]  Internal MEDICINE  Office Visit Note  Patient Name: Alexandria Murray  [id number]  [id number]  Date of Service: 02/20/2021  Chief Complaint  Patient presents with  . Anxiety  . Quality Metric Gaps    Colonoscopy     HPI Patient is here for routine follow-up Has been seen by general surgery for umbilical hernia--scheduled for CT scan next month for further evaluation before possible surgical decision is made Has an appointment with urologist on Monday for bladder prolapse  Reviewed her recent labs--abnormal lipid panel--does not take Zetia daily as prescribed, low vitamin D, elevated hemoglobin  Discussed her sleep patterns--on multiple occasions she was woken herself up gasping for air, reports loud snoring and feeling fatigued throughout the day  Current Medication: Outpatient Encounter Medications as of 02/13/2021  Medication Sig  . acetaminophen (TYLENOL) 500 MG tablet Take 1,000 mg by mouth every 6 (six) hours as needed for mild pain.  . Aspirin-Acetaminophen-Caffeine (PAMPRIN MAX PO) Take 2 tablets by mouth daily.  Marland Kitchen docusate sodium (COLACE) 100 MG capsule Take 1 capsule (100 mg total) by mouth 2 (two) times daily.  Marland Kitchen ezetimibe (ZETIA) 10 MG tablet Take 1 tablet (10 mg total) by mouth daily.  . ferrous sulfate 325 (65 FE) MG tablet Take 1 tablet (325 mg total) by mouth daily.  . folic acid (FOLVITE) 1 MG tablet Take 1 tablet (1 mg total) by mouth daily.  Marland Kitchen gabapentin (NEURONTIN) 100 MG capsule Take 1 capsule (100 mg total) by mouth 2 (two) times daily.  Marland Kitchen lisinopril (ZESTRIL) 2.5 MG tablet Take 2 tablets (5 mg total) by mouth daily.  . meloxicam (MOBIC) 15 MG tablet Take 1 tablet (15 mg total) by mouth daily.  . Multiple Vitamin (MULTIVITAMIN WITH MINERALS) TABS tablet Take 1 tablet by mouth daily.  . Multiple Vitamins-Minerals (MULTIVITAMIN PO) Take 1 tablet by mouth daily.  . ondansetron (ZOFRAN-ODT) 4 MG  disintegrating tablet Take 1 tablet (4 mg total) by mouth every 8 (eight) hours as needed for nausea or vomiting.  . traMADol (ULTRAM) 50 MG tablet Take 2 tablets (100 mg total) by mouth every 12 (twelve) hours as needed for moderate pain.  . traZODone (DESYREL) 100 MG tablet Take 1 tablet (100 mg total) by mouth at bedtime as needed for sleep.  . Vitamin D, Ergocalciferol, (DRISDOL) 1.25 MG (50000 UNIT) CAPS capsule Take 1 capsule (50,000 Units total) by mouth every 7 (seven) days.   No facility-administered encounter medications on file as of 02/13/2021.    Surgical History: Past Surgical History:  Procedure Laterality Date  . CHOLECYSTECTOMY    . COLPOSCOPY  07/26/2015  . FINGER SURGERY      Medical History: Past Medical History:  Diagnosis Date  . Abnormal Pap smear of cervix 06/20/2015  . Anxiety   . 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 Guillain-Barre syndrome (HCC)   . Nicotine dependence   . Stroke (HCC)   . Torn rotator cuff     Family History: Family History  Problem Relation Age of Onset  . Cancer Mother   . Stroke Paternal Grandfather     Social History   Socioeconomic History  . Marital status: Married    Spouse name: Not on file  . Number of children: Not on file  . Years of education: Not on file  . Highest education level:  Not on file  Occupational History  . Not on file  Tobacco Use  . Smoking status: Current Every Day Smoker    Packs/day: 2.00    Types: Cigarettes  . Smokeless tobacco: Never Used  Vaping Use  . Vaping Use: Never used  Substance and Sexual Activity  . Alcohol use: No  . Drug use: No  . Sexual activity: Yes    Birth control/protection: Post-menopausal  Other Topics Concern  . Not on file  Social History Narrative  . Not on file   Social Determinants of Health   Financial Resource Strain: Not on file  Food Insecurity: Not on file   Transportation Needs: Not on file  Physical Activity: Not on file  Stress: Not on file  Social Connections: Not on file  Intimate Partner Violence: Not on file    EPWORTH SLEEPINESS SCALE:  Scale:  (0)= no chance of dozing; (1)= slight chance of dozing; (2)= moderate chance of dozing; (3)= high chance of dozing  Chance  Situtation    Sitting and reading: 3    Watching TV: 0    Sitting Inactive in public: 3    As a passenger in car: 0      Lying down to rest: 1    Sitting and talking: 0    Sitting quielty after lunch: 0    In a car, stopped in traffic: 0   TOTAL SCORE:   7 out of 24   Review of Systems  Constitutional: Negative for chills, diaphoresis and fatigue.  HENT: Negative for ear pain, postnasal drip and sinus pressure.   Eyes: Negative for photophobia, discharge, redness, itching and visual disturbance.  Respiratory: Negative for cough, shortness of breath and wheezing.   Cardiovascular: Negative for chest pain, palpitations and leg swelling.  Gastrointestinal: Negative for abdominal pain, constipation, diarrhea, nausea and vomiting.  Genitourinary: Negative for dysuria and flank pain.  Musculoskeletal: Negative for arthralgias, back pain, gait problem and neck pain.  Skin: Negative for color change.  Allergic/Immunologic: Negative for environmental allergies and food allergies.  Neurological: Negative for dizziness and headaches.  Hematological: Does not bruise/bleed easily.  Psychiatric/Behavioral: Negative for agitation, behavioral problems (depression) and hallucinations.    Vital Signs: BP 111/72   Pulse 79   Temp 98.2 F (36.8 C)   Resp 16   Ht 5' (1.524 m)   Wt 142 lb (64.4 kg)   SpO2 98%   BMI 27.73 kg/m    Physical Exam Vitals reviewed.  Constitutional:      Appearance: Normal appearance. She is normal weight.  Cardiovascular:     Rate and Rhythm: Normal rate and regular rhythm.     Pulses: Normal pulses.     Heart sounds:  Normal heart sounds.  Pulmonary:     Effort: Pulmonary effort is normal.     Breath sounds: Normal breath sounds.  Abdominal:     General: Abdomen is flat.     Palpations: Abdomen is soft.  Musculoskeletal:        General: Normal range of motion.     Cervical back: Normal range of motion.  Skin:    General: Skin is warm.  Neurological:     General: No focal deficit present.     Mental Status: She is alert and oriented to person, place, and time. Mental status is at baseline.  Psychiatric:        Mood and Affect: Mood normal.        Behavior: Behavior normal.  Thought Content: Thought content normal.        Judgment: Judgment normal.   Assessment/Plan: 1. Essential hypertension BP and HR remain well controlled, continue to monitor  2. Mixed hyperlipidemia Encouraged to start Zetia daily--unable to tolerate statin therapy in the past  3. Sleep disturbance Loud snoring, waking up gasping, daytime fatigue, polycythemia--risk factors for OSA - PSG Sleep Study; Future  4. Polycythemia Will review PSG for possible underlying hypoxia contributing to elevated hgb  5. Vitamin D deficiency Start Vitamin D supplements--once finished with script encouraged to start OTC supplements - Vitamin D, Ergocalciferol, (DRISDOL) 1.25 MG (50000 UNIT) CAPS capsule; Take 1 capsule (50,000 Units total) by mouth every 7 (seven) days.  Dispense: 5 capsule; Refill: 0  General Counseling: Aundraya verbalizes understanding of the findings of todays visit and agrees with plan of treatment. I have discussed any further diagnostic evaluation that may be needed or ordered today. We also reviewed her medications today. she has been encouraged to call the office with any questions or concerns that should arise related to todays visit.    Orders Placed This Encounter  Procedures  . PSG Sleep Study    Meds ordered this encounter  Medications  . Vitamin D, Ergocalciferol, (DRISDOL) 1.25 MG (50000 UNIT)  CAPS capsule    Sig: Take 1 capsule (50,000 Units total) by mouth every 7 (seven) days.    Dispense:  5 capsule    Refill:  0    Time spent: 30 Minutes Time spent includes review of chart, medications, test results and follow-up plan with the patient.  This patient was seen by Leeanne Deed AGNP-C in Collaboration with Dr Lyndon Code as a part of collaborative care agreement     Lubertha Basque. Britini Garcilazo AGNP-C Internal medicine

## 2021-02-18 ENCOUNTER — Other Ambulatory Visit: Payer: Self-pay

## 2021-02-18 ENCOUNTER — Ambulatory Visit (INDEPENDENT_AMBULATORY_CARE_PROVIDER_SITE_OTHER): Payer: Commercial Managed Care - PPO | Admitting: Urology

## 2021-02-18 VITALS — BP 110/76 | HR 80 | Ht 60.0 in | Wt 136.0 lb

## 2021-02-18 DIAGNOSIS — R39198 Other difficulties with micturition: Secondary | ICD-10-CM | POA: Diagnosis not present

## 2021-02-18 DIAGNOSIS — R351 Nocturia: Secondary | ICD-10-CM

## 2021-02-18 DIAGNOSIS — R3129 Other microscopic hematuria: Secondary | ICD-10-CM | POA: Diagnosis not present

## 2021-02-18 DIAGNOSIS — N8110 Cystocele, unspecified: Secondary | ICD-10-CM | POA: Diagnosis not present

## 2021-02-18 LAB — URINALYSIS, COMPLETE
Bilirubin, UA: NEGATIVE
Glucose, UA: NEGATIVE
Ketones, UA: NEGATIVE
Leukocytes,UA: NEGATIVE
Nitrite, UA: NEGATIVE
Specific Gravity, UA: >1.03 — ABNORMAL HIGH (ref 1.005–1.030)
Urobilinogen, Ur: 0.2 mg/dL (ref 0.2–1.0)
pH, UA: 5 (ref 5.0–7.5)

## 2021-02-18 LAB — MICROSCOPIC EXAMINATION

## 2021-02-18 LAB — BLADDER SCAN AMB NON-IMAGING: Scan Result: 18

## 2021-02-18 MED ORDER — MIRABEGRON ER 50 MG PO TB24: 50.0000 mg | ORAL_TABLET | Freq: Every day | ORAL | 11 refills | Status: DC

## 2021-02-18 NOTE — Progress Notes (Addendum)
02/18/2021 3:31 PM   Alexandria Murray 06-Mar-1976 [id number]  Referring provider: Theotis Burrow, NP [address] [place],  [place] [address]  Chief Complaint  Patient presents with  . Bladder Prolapse    HPI: I was consulted to assess the patient for a feeling of incomplete bladder emptying.  She voids every 1-2 hours and gets up 10 times a night.  She is continent.  Her flow was good.  It slows down near the end that she does positional changes and usually feels empty but sometimes does not.  Referral notes noted prolapse.  She does feel some vaginal bulging that comes and goes  She has had a stroke many years ago and is on aspirin.  She has not had a hysterectomy.  Bowel movements normal.  No history of kidney stones infections or bladder surgery   PMH: Past Medical History:  Diagnosis Date  . Abnormal Pap smear of cervix 06/20/2015  . Anxiety   . 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 Guillain-Barre syndrome (HCC)   . Nicotine dependence   . Stroke (HCC)   . Torn rotator cuff     Surgical History: Past Surgical History:  Procedure Laterality Date  . CHOLECYSTECTOMY    . COLPOSCOPY  07/26/2015  . FINGER SURGERY      Home Medications:  Allergies as of 02/18/2021      Reactions   Percocet [oxycodone-acetaminophen] Nausea And Vomiting      Medication List       Accurate as of February 18, 2021  3:31 PM. If you have any questions, ask your nurse or doctor.        acetaminophen 500 MG tablet Commonly known as: TYLENOL Take 1,000 mg by mouth every 6 (six) hours as needed for mild pain.   docusate sodium 100 MG capsule Commonly known as: COLACE Take 1 capsule (100 mg total) by mouth 2 (two) times daily.   ezetimibe 10 MG tablet Commonly known as: ZETIA Take 1 tablet (10 mg total) by mouth daily.   ferrous sulfate 325 (65 FE) MG tablet Take 1 tablet (325 mg total) by mouth  daily.   folic acid 1 MG tablet Commonly known as: FOLVITE Take 1 tablet (1 mg total) by mouth daily.   gabapentin 100 MG capsule Commonly known as: NEURONTIN Take 1 capsule (100 mg total) by mouth 2 (two) times daily.   lisinopril 2.5 MG tablet Commonly known as: ZESTRIL Take 2 tablets (5 mg total) by mouth daily.   meloxicam 15 MG tablet Commonly known as: MOBIC Take 1 tablet (15 mg total) by mouth daily.   MULTIVITAMIN PO Take 1 tablet by mouth daily.   multivitamin with minerals Tabs tablet Take 1 tablet by mouth daily.   ondansetron 4 MG disintegrating tablet Commonly known as: ZOFRAN-ODT Take 1 tablet (4 mg total) by mouth every 8 (eight) hours as needed for nausea or vomiting.   PAMPRIN MAX PO Take 2 tablets by mouth daily.   traMADol 50 MG tablet Commonly known as: ULTRAM Take 2 tablets (100 mg total) by mouth every 12 (twelve) hours as needed for moderate pain.   traZODone 100 MG tablet Commonly known as: DESYREL Take 1 tablet (100 mg total) by mouth at bedtime as needed for sleep.   Vitamin D (Ergocalciferol) 1.25 MG (50000 UNIT) Caps capsule Commonly known as: DRISDOL Take 1 capsule (50,000 Units total) by  mouth every 7 (seven) days.       Allergies:  Allergies  Allergen Reactions  . Percocet [Oxycodone-Acetaminophen] Nausea And Vomiting    Family History: Family History  Problem Relation Age of Onset  . Cancer Mother   . Stroke Paternal Grandfather     Social History:  reports that she has been smoking cigarettes. She has been smoking about 2.00 packs per day. She has never used smokeless tobacco. She reports that she does not drink alcohol and does not use drugs.  ROS:                                        Physical Exam: BP 110/76   Pulse 80   Ht 5' (1.524 m)   Wt 61.7 kg   BMI 26.56 kg/m   Constitutional:  Alert and oriented, No acute distress. HEENT: [place] AT, moist mucus membranes.  Trachea midline, no  masses. Cardiovascular: No clubbing, cyanosis, or edema. Respiratory: Normal respiratory effort, no increased work of breathing. GI: Abdomen is soft, nontender, nondistended, no abdominal masses GU: No CVA tenderness.  On pelvic examination she had a moderate grade 2 cystocele or small grade 3 that did not have a lot of central defect.  On careful examination she appears to have a very small but just palpable cervix.  She had a very short anterior vaginal length.  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  She had a small rectocele.  No stress incontinence. Skin: No rashes, bruises or suspicious lesions. Lymph: No cervical or inguinal adenopathy. Neurologic: Grossly intact, no focal deficits, moving all 4 extremities. Psychiatric: Normal mood and affect.  Laboratory Data: Lab Results  Component Value Date   WBC 10.6 02/01/2021   HGB 16.2 (H) 02/01/2021   HCT 47.3 (H) 02/01/2021   MCV 93 02/01/2021   PLT 305 02/01/2021    Lab Results  Component Value Date   CREATININE 0.79 02/01/2021    No results found for: PSA  No results found for: TESTOSTERONE  No results found for: HGBA1C  Urinalysis    Component Value Date/Time   COLORURINE AMBER (A) 05/28/2015 0325   APPEARANCEUR Clear 09/13/2020 1530   LABSPEC 1.031 (H) 05/28/2015 0325   PHURINE 5.0 05/28/2015 0325   GLUCOSEU Negative 09/13/2020 1530   HGBUR 3+ (A) 05/28/2015 0325   BILIRUBINUR Negative 09/13/2020 1530   KETONESUR 1+ (A) 05/28/2015 0325   PROTEINUR Trace 09/13/2020 1530   PROTEINUR >500 (A) 05/28/2015 0325   NITRITE Negative 09/13/2020 1530   NITRITE NEGATIVE 05/28/2015 0325   LEUKOCYTESUR Negative 09/13/2020 1530    Pertinent Imaging: Urine positive for blood.  Urine sent for culture.  Chart reviewed.  Post void residual 11 mL  Assessment & Plan: Patient has frequency and impressive nighttime frequency.  I sent the urine for culture.  She had microscopic hematuria.   CT scan ordered and I will call if abnormal.  Come back in 6-week for cystoscopy.  Picture drawn.  The role of urodynamics briefly mentioned.  If she ever had surgery she likely best benefit from a robotic procedure because of the vaginal length issues noted especially anteriorly.  It would not in my opinion help her frequency and nocturia.   Perform cystoscopy in 6 weeks on Myrbetriq 50 mg samples and prescription.  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  No follow-ups on file.  Martina Sinner, MD  [facility] [address], [address] [place], [place] [address] [phone]

## 2021-02-20 ENCOUNTER — Encounter: Payer: Self-pay | Admitting: Hospice and Palliative Medicine

## 2021-02-21 LAB — CULTURE, URINE COMPREHENSIVE

## 2021-03-01 ENCOUNTER — Other Ambulatory Visit: Payer: Self-pay

## 2021-03-01 ENCOUNTER — Ambulatory Visit
Admission: RE | Admit: 2021-03-01 | Discharge: 2021-03-01 | Disposition: A | Discharge status: Home / Self Care | Payer: Commercial Managed Care - PPO | Source: Ambulatory Visit | Attending: Surgery | Admitting: Surgery

## 2021-03-01 DIAGNOSIS — R3129 Other microscopic hematuria: Secondary | ICD-10-CM | POA: Diagnosis not present

## 2021-03-01 MED ORDER — IOHEXOL 300 MG/ML  SOLN
125.0000 mL | Freq: Once | INTRAMUSCULAR | Status: AC | PRN
  Administered 2021-03-01: 125 mL via INTRAVENOUS

## 2021-03-06 ENCOUNTER — Other Ambulatory Visit: Payer: Self-pay | Admitting: Internal Medicine

## 2021-03-06 DIAGNOSIS — G610 Guillain-Barre syndrome: Secondary | ICD-10-CM

## 2021-03-06 DIAGNOSIS — M150 Primary generalized (osteo)arthritis: Secondary | ICD-10-CM

## 2021-04-01 ENCOUNTER — Ambulatory Visit: Payer: Commercial Managed Care - PPO | Admitting: Internal Medicine

## 2021-04-01 ENCOUNTER — Other Ambulatory Visit: Payer: Self-pay | Admitting: Urology

## 2021-04-05 ENCOUNTER — Encounter (INDEPENDENT_AMBULATORY_CARE_PROVIDER_SITE_OTHER): Payer: Commercial Managed Care - PPO | Admitting: Internal Medicine

## 2021-04-05 DIAGNOSIS — G4719 Other hypersomnia: Secondary | ICD-10-CM | POA: Diagnosis not present

## 2021-04-11 ENCOUNTER — Encounter: Payer: Self-pay | Admitting: Physician Assistant

## 2021-04-11 ENCOUNTER — Ambulatory Visit: Payer: Commercial Managed Care - PPO | Admitting: Physician Assistant

## 2021-04-11 ENCOUNTER — Other Ambulatory Visit: Payer: Self-pay

## 2021-04-11 DIAGNOSIS — G4719 Other hypersomnia: Secondary | ICD-10-CM | POA: Insufficient documentation

## 2021-04-11 DIAGNOSIS — F17218 Nicotine dependence, cigarettes, with other nicotine-induced disorders: Secondary | ICD-10-CM

## 2021-04-11 DIAGNOSIS — F411 Generalized anxiety disorder: Secondary | ICD-10-CM

## 2021-04-11 DIAGNOSIS — F5101 Primary insomnia: Secondary | ICD-10-CM | POA: Diagnosis not present

## 2021-04-11 MED ORDER — ESCITALOPRAM OXALATE 10 MG PO TABS: 10.0000 mg | ORAL_TABLET | Freq: Every day | ORAL | 2 refills | Status: DC

## 2021-04-11 NOTE — Progress Notes (Signed)
[facility] [address] [place], [place] [address]  Internal MEDICINE  Office Visit Note  Patient Name: Alexandria Murray  [id number]  [id number]  Date of Service: 04/14/2021  Chief Complaint  Patient presents with   Follow-up    Sleep study follow up   Anxiety    HPI Pt is here for routine follow up to discuss PSG. -Reviewed PSG results that do not indicate OSA. Goes to bed at 11:30 wakes up at 5. Does not sleep with TV on unless her husband is home--he is a truck driver and schedule is thrown off from time to time. Reports sleep in the lab during the study was the best sleep she has had in a long time. -Will take trazodone as needed, discussed active thinking and sleep hygiene -Goes back to see urology for follow up on CT results -general surgery is following her for possible hernia as well -smoking 1.5ppd, slowly decreasing, but doesn't think she will be able to quit completely until she is no longer working  Current Medication: Outpatient Encounter Medications as of 04/11/2021  Medication Sig   acetaminophen (TYLENOL) 500 MG tablet Take 1,000 mg by mouth every 6 (six) hours as needed for mild pain.   Aspirin-Acetaminophen-Caffeine (PAMPRIN MAX PO) Take 2 tablets by mouth daily.   docusate sodium (COLACE) 100 MG capsule Take 1 capsule (100 mg total) by mouth 2 (two) times daily.   escitalopram (LEXAPRO) 10 MG tablet Take 1 tablet (10 mg total) by mouth daily.   ezetimibe (ZETIA) 10 MG tablet Take 1 tablet (10 mg total) by mouth daily.   ferrous sulfate 325 (65 FE) MG tablet Take 1 tablet (325 mg total) by mouth daily.   folic acid (FOLVITE) 1 MG tablet Take 1 tablet (1 mg total) by mouth daily.   gabapentin (NEURONTIN) 100 MG capsule Take 1 capsule by mouth twice daily   lisinopril (ZESTRIL) 2.5 MG tablet Take 2 tablets (5 mg total) by mouth daily.   meloxicam (MOBIC) 15 MG tablet Take 1 tablet (15 mg total) by mouth daily.   mirabegron ER (MYRBETRIQ) 50 MG TB24  tablet Take 1 tablet (50 mg total) by mouth daily.   Multiple Vitamin (MULTIVITAMIN WITH MINERALS) TABS tablet Take 1 tablet by mouth daily.   Multiple Vitamins-Minerals (MULTIVITAMIN PO) Take 1 tablet by mouth daily.   ondansetron (ZOFRAN-ODT) 4 MG disintegrating tablet Take 1 tablet (4 mg total) by mouth every 8 (eight) hours as needed for nausea or vomiting.   traMADol (ULTRAM) 50 MG tablet Take 2 tablets (100 mg total) by mouth every 12 (twelve) hours as needed for moderate pain.   traZODone (DESYREL) 100 MG tablet Take 1 tablet (100 mg total) by mouth at bedtime as needed for sleep.   Vitamin D, Ergocalciferol, (DRISDOL) 1.25 MG (50000 UNIT) CAPS capsule Take 1 capsule (50,000 Units total) by mouth every 7 (seven) days.   No facility-administered encounter medications on file as of 04/11/2021.    Surgical History: Past Surgical History:  Procedure Laterality Date   CHOLECYSTECTOMY     COLPOSCOPY  07/26/2015   FINGER SURGERY      Medical History: Past Medical History:  Diagnosis Date   Abnormal Pap smear of cervix 06/20/2015   Anxiety    Cervical high risk human papillomavirus (HPV) DNA test positive    Dysplasia of cervix, low grade (CIN 1) 07/26/2015   Guillain-Barre syndrome (HCC)    Nicotine dependence    Stroke (HCC)    Torn  rotator cuff     Family History: Family History  Problem Relation Age of Onset   Cancer Mother    Stroke Paternal Grandfather     Social History   Socioeconomic History   Marital status: Married    Spouse name: Not on file   Number of children: Not on file   Years of education: Not on file   Highest education level: Not on file  Occupational History   Not on file  Tobacco Use   Smoking status: Every Day    Packs/day: 1.00    Pack years: 0.00    Types: Cigarettes   Smokeless tobacco: Never  Vaping Use   Vaping Use: Never used  Substance and Sexual Activity   Alcohol use: No   Drug use: No   Sexual activity: Yes    Birth  control/protection: Post-menopausal  Other Topics Concern   Not on file  Social History Narrative   Not on file   Social Determinants of Health   Financial Resource Strain: Not on file  Food Insecurity: Not on file  Transportation Needs: Not on file  Physical Activity: Not on file  Stress: Not on file  Social Connections: Not on file  Intimate Partner Violence: Not on file      Review of Systems  Constitutional:  Positive for fatigue. Negative for chills and unexpected weight change.  HENT:  Negative for congestion, postnasal drip, rhinorrhea, sneezing and sore throat.   Eyes:  Negative for redness.  Respiratory:  Negative for cough, chest tightness and shortness of breath.   Cardiovascular:  Negative for chest pain and palpitations.  Gastrointestinal:  Negative for abdominal pain, constipation, diarrhea, nausea and vomiting.  Genitourinary:  Negative for dysuria and frequency.  Musculoskeletal:  Negative for arthralgias, back pain, joint swelling and neck pain.  Skin:  Negative for rash.  Neurological: Negative.  Negative for tremors and numbness.  Hematological:  Negative for adenopathy. Does not bruise/bleed easily.  Psychiatric/Behavioral:  Positive for sleep disturbance. Negative for behavioral problems (Depression) and suicidal ideas. The patient is nervous/anxious.    Vital Signs: BP 120/80   Pulse 88   Temp 97.8 F (36.6 C)   Resp 16   Ht 5' (1.524 m)   Wt 148 lb 3.2 oz (67.2 kg)   LMP 02/02/2018 (Approximate)   SpO2 98%   BMI 28.94 kg/m    Physical Exam Vitals and nursing note reviewed.  Constitutional:      General: She is not in acute distress.    Appearance: She is well-developed. She is not diaphoretic.  HENT:     Head: Normocephalic and atraumatic.     Mouth/Throat:     Pharynx: No oropharyngeal exudate.  Eyes:     Pupils: Pupils are equal, round, and reactive to light.  Neck:     Thyroid: No thyromegaly.     Vascular: No JVD.     Trachea: No  tracheal deviation.  Cardiovascular:     Rate and Rhythm: Normal rate and regular rhythm.     Heart sounds: Normal heart sounds. No murmur heard.   No friction rub. No gallop.  Pulmonary:     Effort: Pulmonary effort is normal. No respiratory distress.     Breath sounds: No wheezing or rales.  Chest:     Chest wall: No tenderness.  Abdominal:     General: Bowel sounds are normal.     Palpations: Abdomen is soft.  Musculoskeletal:        General:  Normal range of motion.     Cervical back: Normal range of motion and neck supple.  Lymphadenopathy:     Cervical: No cervical adenopathy.  Skin:    General: Skin is warm and dry.  Neurological:     Mental Status: She is alert and oriented to person, place, and time.     Cranial Nerves: No cranial nerve deficit.  Psychiatric:        Behavior: Behavior normal.        Thought Content: Thought content normal.        Judgment: Judgment normal.       Assessment/Plan: 1. Generalized anxiety disorder Continue Lexapro daily - escitalopram (LEXAPRO) 10 MG tablet; Take 1 tablet (10 mg total) by mouth daily.  Dispense: 30 tablet; Refill: 2  2. Primary insomnia Continue trazodone before bed, encouraged to increase total sleep time greater than 7 hours.  Also discussed sleep hygiene as well as active thinking exercises  3. Cigarette nicotine dependence with other nicotine-induced disorder Smoking cessation counseling: Pt acknowledges the risks of long term smoking, she will try to quite smoking. Options for different medications including nicotine products, chewing gum, patch etc, Wellbutrin and Chantix is discussed Goal and date of compete cessation is discussed Total time spent in smoking cessation is 10 min.    General Counseling: Roshanna verbalizes understanding of the findings of todays visit and agrees with plan of treatment. I have discussed any further diagnostic evaluation that may be needed or ordered today. We also reviewed her  medications today. she has been encouraged to call the office with any questions or concerns that should arise related to todays visit.    No orders of the defined types were placed in this encounter.   Meds ordered this encounter  Medications   escitalopram (LEXAPRO) 10 MG tablet    Sig: Take 1 tablet (10 mg total) by mouth daily.    Dispense:  30 tablet    Refill:  2    This patient was seen by Lynn Ito, PA-C in collaboration with Dr. Beverely Risen as a part of collaborative care agreement.   Total time spent:30 Minutes Time spent includes review of chart, medications, test results, and follow up plan with the patient.      Dr Lyndon Code Internal medicine

## 2021-04-11 NOTE — Procedures (Signed)
[facility]  Polysomnogram Report Part I                                                                 Phone: [phone] Fax: [phone]  Patient Name: Alexandria, Murray Acquisition Number: [id number]  Date of Birth: 1976/01/16 Acquisition Date: 04/05/2021  Referring Physician: Leeanne Deed, NP     History: The patient is a 45 year old female who was referred for evaluation of possible sleep apnea. Medical History: anxiety, stroke.  Medications: acetaminophen, aspirin, docusate sodium, ezetimibe, ferroues sulfate, floic acid, gabapentin, lisinopril, meloxicam, multiple vitamins-minerals, ondansetron, tramadol, Trazodone, vitamin D.  Procedure: This routine overnight polysomnogram was performed on the [facility] 5 using the standard diagnostic protocol. This included 6 channels of EEG, 2 channels of EOG, chin EMG, bilateral anterior tibialis EMG, nasal/oral thermistor, PTAF (nasal pressure transducer), chest and abdominal wall movements, EKG, and pulse oximetry.  Description: The total recording time was 410.3 minutes. The total sleep time was 362.0 minutes. There were a total of 22.5 minutes of wakefulness after sleep onset for a slightly reducedsleep efficiency of 88.2%. The latency to sleep onset was within normal limitsat 25.8 minutes. The R sleep onset latency was slightly prolonged at 121.0 minutes. Sleep parameters, as a percentage of the total sleep time, demonstrated 2.9% of sleep was in N1 sleep, 75.4% N2, 9.0% N3 and 12.7% R sleep. There were a total of 60 arousals for an arousal index of 9.9 arousals per hour of sleep that was normal.  Respiratory monitoring demonstrated occasional mild degree of snoring. Supine sleep was not observed. There were 9 apneas and hypopneas for an Apnea Hypopnea Index of 1.5 apneas and hypopneas per hour of sleep. The REM related apnea hypopnea index was 1.3/hr of REM sleep compared to a NREM AHI of 1.5/hr. The Respiratory Disturbance Index,  which includes 5 respiratory effort related arousals (RERAs), was 2.3 respiratory events per hour of sleep.  The average duration of the respiratory events was 23.2 seconds with a maximum duration of 41.5 seconds. The respiratory events were associated with peripheral oxygen desaturations on the average to 92%. The lowest oxygen desaturation associated with a respiratory event was 84%. Additionally, the baseline oxygen saturation during wakefulness was 96%, during NREM sleep averaged 95%, and during REM sleep averaged  95%. The total duration of oxygen < 90% was 0.1 minutes.  Cardiac monitoring- There were no significant cardiac rhythm irregularities.   Periodic limb movement monitoring- demonstrated that there were 57 periodic limb movements for a periodic limb movement index of 9.4 periodic limb movements per hour of sleep.    Impression: This routine overnight polysomnogram did not demonstrate significant obstructive sleep apnea due to a low Apnea Hypopnea Index of 1.5 apneas and hypopneas per hour.   There were few periodic limb movements, however they occurred frequently in the early morning. The patient reports symptoms suggestive of restless leg syndrome. Clinical correlation would be suggested.   There was a slightly reduced sleep efficiency with reduced percentages of REM and slow wave sleep. The patient reports daytime sleepiness but reported time in bed is limited. The patient also reports difficulty initiating sleep and sleeping better in the laboratory. This suggests that conditioning factors contribute to the insomnia. Sleep hygiene  recommendations should be reviewed. The patient may benefit from cognitive behavioral therapy for insomnia. This can be done with a mental health professional or via web-based programs.   Yevonne Pax, MD, [facility] Diplomate ABMS-Pulmonary, Critical Care and Sleep Medicine  Electronically reviewed and digitally signed     [facility] Polysomnogram Report Part II  Phone: [phone] Fax: [phone]  Patient last name Murray Neck Size 13.0 in. Acquisition [id number]  Patient first name Alexandria Weight 134.0 lbs. Started 04/05/2021 at 10:48:04 PM  Birth date 04-04-1976 Height 60.0 in. Stopped 04/06/2021 at 5:43:22 AM  Age 14 BMI 26.2 lb/in2 Duration 410.3  Study Type Adult      Report generated by: Hampton Abbot, RPSGT Sleep Data: Lights Out: 10:52:46 PM Sleep Onset: 11:18:34 PM  Lights On: 5:43:04 AM Sleep Efficiency: 88.2 %  Total Recording Time: 410.3 min Sleep Latency (from Lights Off) 25.8 min  Total Sleep Time (TST): 362.0 min R Latency (from Sleep Onset): 121.0 min  Sleep Period Time: 384.5 min Total number of awakenings: 13  Wake during sleep: 22.5 min Wake After Sleep Onset (WASO): 22.5 min   Sleep Data:         Arousal Summary: Stage  Latency from lights out (min) Latency from sleep onset (min) Duration (min) % Total Sleep Time  Normal values  N 1 25.8 0.0 10.5 2.9 (5%)  N 2 26.8 1.0 273.0 75.4 (50%)  N 3 46.8 21.0 32.5 9.0 (20%)  R 146.8 121.0 46.0 12.7 (25%)    Number Index  Spontaneous 43 7.1  Apneas & Hypopneas 4 0.7  RERAs 5 0.8       (Apneas & Hypopneas & RERAs)  (9) (1.5)  Limb Movement 29 4.8  Snore 0 0.0  TOTAL 81 13.4     Respiratory Data:  CA OA MA Apnea Hypopnea* A+ H RERA Total  Number 0 1 0 1 8 9 5 14   Mean Dur (sec) 0.0 16.0 0.0 16.0 26.4 25.3 19.4 23.2  Max Dur (sec) 0.0 16.0 0.0 16.0 41.5 41.5 31.0 41.5  Total Dur (min) 0.0 0.3 0.0 0.3 3.5 3.8 1.6 5.4  % of TST 0.0 0.1 0.0 0.1 1.0 1.0 0.4 1.5  Index (#/h TST) 0.0 0.2 0.0 0.2 1.3 1.5 0.8 2.3  *Hypopneas scored based on 4% or greater desaturation.  Sleep Stage:        REM NREM TST  AHI 1.3 1.5 1.5  RDI 1.3 2.5 2.3            Body Position Data:  Sleep (min) TST (%) REM (min) NREM (min) CA (#) OA (#) MA (#) HYP (#) AHI (#/h) RERA (#) RDI (#/h) Desat (#)  Supine    0.00                      0  0.00     Non-Supine 362.00 100.00 46.00 316.00 0.00 1.00 0.00 8.00 1.49 5.00 2.32 36.00  Left: 244.3 67.49 13.0 231.3 0 1 0 6 1.7 5 2.9 23  Prone: 18.9 5.22 18.4 0.5 0 0 0 0 0.0 0 0.00 0  Right: 98.8 27.29 14.6 84.2 0 0 0 2 1.2 0 1.2 13     Snoring: Total number of snoring episodes  0  Total time with snoring    min (   % of sleep)   Oximetry Distribution:             WK REM NREM TOTAL  Average (%)   96 95 95 95  < 90% 0.0 0.0 0.1 0.1  < 80% 0.0 0.0 0.0 0.0  < 70% 0.0 0.0 0.0 0.0  # of Desaturations* 2 2 32 36  Desat Index (#/hour) 2.5 2.6 6.1 6.0  Desat Max (%) 4 3 17 17   Desat Max Dur (sec) 16.0 61.0 101.0 101.0  Approx Min O2 during sleep 78  Approx min O2 during a respiratory event 84  Was Oxygen added (Y/N) and final rate No:   0 LPM  *Desaturations based on 3% or greater drop from baseline.   Cheyne Stokes Breathing: None Present    Heart Rate Summary:  Average Heart Rate During Sleep 71.0 bpm      Highest Heart Rate During Sleep (95th %) 80.0 bpm      Highest Heart Rate During Sleep 201 bpm (artifact)  Highest Heart Rate During Recording (TIB) 201 bpm (artifact)   Heart Rate Observations: Event Type # Events   Bradycardia 0 Lowest HR Scored: N/A  Sinus Tachycardia During Sleep 0 Highest HR Scored: N/A  Narrow Complex Tachycardia 0 Highest HR Scored: N/A  Wide Complex Tachycardia 0 Highest HR Scored: N/A  Asystole 0 Longest Pause: N/A  Atrial Fibrillation 0 Duration Longest Event: N/A  Other Arrythmias  No Type:    Periodic Limb Movement Data: (Primary legs unless otherwise noted) Total # Limb Movement 60 Limb Movement Index 9.9  Total # PLMS 57 PLMS Index 9.4  Total # PLMS Arousals 27 PLMS Arousal Index 4.5  Percentage Sleep Time with PLMS 28.92min (8.0 % sleep)  Mean Duration limb movements (secs) 1729.0

## 2021-04-22 ENCOUNTER — Ambulatory Visit: Payer: Commercial Managed Care - PPO | Admitting: Urology

## 2021-04-22 ENCOUNTER — Other Ambulatory Visit: Payer: Self-pay

## 2021-04-22 VITALS — BP 117/77 | HR 88 | Wt 148.0 lb

## 2021-04-22 DIAGNOSIS — R3129 Other microscopic hematuria: Secondary | ICD-10-CM | POA: Diagnosis not present

## 2021-04-22 DIAGNOSIS — R351 Nocturia: Secondary | ICD-10-CM

## 2021-04-22 NOTE — Progress Notes (Signed)
04/22/2021 2:46 PM   Lamanda Hinton Dyer Sep 19, 1976 [id number]  Referring provider: Lyndon Code, MD [address] [place],  [place] [address]  Chief Complaint  Patient presents with   Cysto    HPI: I was consulted to assess the patient for a feeling of incomplete bladder emptying.  She voids every 1-2 hours and gets up 10 times a night.  She is continent.  Her flow was good.  It slows down near the end that she does positional changes and usually feels empty but sometimes does not.  Referral notes noted prolapse.  She does feel some vaginal bulging that comes and goes   She has had a stroke many years ago and is on aspirin.  She has not had a hysterectomy.    On pelvic examination she had a moderate grade 2 cystocele or small grade 3 that did not have a lot of central defect.  On careful examination she appears to have a very small but just palpable cervix.  She had a very short anterior vaginal length.  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  She had a small rectocele.  No stress incontinence.  Patient has frequency and impressive nighttime frequency.  I sent the urine for culture.  She had microscopic hematuria.  CT scan ordered and I will call if abnormal.  Come back in 6-week for cystoscopy.  Picture drawn.  The role of urodynamics briefly mentioned.  If she ever had surgery she likely best benefit from a robotic procedure because of the vaginal length issues noted especially anteriorly.  It would not in my opinion help her frequency and nocturia.   Perform cystoscopy in 6 weeks on Myrbetriq 50 mg samples and prescription.  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  Hematuria CT scan demonstrated nonobstructive punctate right renal stone Urine culture was negative Patient has had a dramatic reduction in frequency and especially nocturia on  the Myrbetriq.  Now gets up 3-4 times a very happy Patient underwent flexible cystoscopy utilizing sterile technique.  Bladder mucosa and trigone were normal.  No stitch foreign body or carcinoma.  No cystitis.     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 If you have any questions, ask your nurse or doctor.          acetaminophen 500 MG tablet Commonly known as: TYLENOL Take 1,000 mg by mouth every 6 (six) hours as needed for mild pain.   docusate sodium 100 MG capsule Commonly known as: COLACE Take 1 capsule (100 mg total) by mouth 2 (two)  times daily.   escitalopram 10 MG tablet Commonly known as: Lexapro Take 1 tablet (10 mg total) by mouth daily.   ezetimibe 10 MG tablet Commonly known as: ZETIA Take 1 tablet (10 mg total) by mouth daily.   ferrous sulfate 325 (65 FE) MG tablet Take 1 tablet (325 mg total) by mouth daily.   folic acid 1 MG tablet Commonly known as: FOLVITE Take 1 tablet (1 mg total) by mouth daily.   gabapentin 100 MG capsule Commonly known as: NEURONTIN Take 1 capsule by mouth twice daily   lisinopril 2.5 MG tablet Commonly known as: ZESTRIL Take 2 tablets (5 mg total) by mouth daily.   meloxicam 15 MG tablet Commonly known as: MOBIC Take 1 tablet (15 mg total) by mouth daily.   mirabegron ER 50 MG Tb24 tablet Commonly known as:  MYRBETRIQ Take 1 tablet (50 mg total) by mouth daily.   MULTIVITAMIN PO Take 1 tablet by mouth daily.   multivitamin with minerals Tabs tablet Take 1 tablet by mouth daily.   ondansetron 4 MG disintegrating tablet Commonly known as: ZOFRAN-ODT Take 1 tablet (4 mg total) by mouth every 8 (eight) hours as needed for nausea or vomiting.   PAMPRIN MAX PO Take 2 tablets by mouth daily.   traMADol 50 MG tablet Commonly known as: ULTRAM Take 2 tablets (100 mg total) by mouth every 12 (twelve) hours as needed for moderate pain.   traZODone 100 MG tablet Commonly known as: DESYREL Take 1 tablet (100 mg total) by mouth at bedtime as needed for sleep.   Vitamin D (Ergocalciferol) 1.25 MG (50000 UNIT) Caps capsule Commonly known as: DRISDOL Take 1 capsule (50,000 Units total) by mouth every 7 (seven) days.        Allergies:  Allergies  Allergen Reactions   Percocet [Oxycodone-Acetaminophen] Nausea And Vomiting    Family History: Family History  Problem Relation Age of Onset   Cancer Mother    Stroke Paternal Grandfather     Social History:  reports that she has been smoking cigarettes. She has been smoking an average of 1.00 packs per day. She has never used smokeless tobacco. She reports that she does not drink alcohol and does not use drugs.  ROS:                                        Physical Exam: LMP 02/02/2018 (Approximate)   Constitutional:  Alert and oriented, No acute distress. HEENT: [place] AT, moist mucus membranes.  Trachea midline, no masses.  Laboratory Data: Lab Results  Component Value Date   WBC 10.6 02/01/2021   HGB 16.2 (H) 02/01/2021   HCT 47.3 (H) 02/01/2021   MCV 93 02/01/2021   PLT 305 02/01/2021    Lab Results  Component Value Date   CREATININE 0.79 02/01/2021    No results found for: PSA  No results found for: TESTOSTERONE  No results found for: HGBA1C  Urinalysis    Component Value Date/Time   COLORURINE  AMBER (A) 05/28/2015 0325   APPEARANCEUR Cloudy (A) 02/18/2021 1538   LABSPEC 1.031 (H) 05/28/2015 0325   PHURINE 5.0 05/28/2015 0325   GLUCOSEU Negative 02/18/2021 1538   HGBUR 3+ (A) 05/28/2015 0325   BILIRUBINUR Negative 02/18/2021 1538   KETONESUR 1+ (A) 05/28/2015 0325   PROTEINUR 2+ (A) 02/18/2021 1538   PROTEINUR >500 (A) 05/28/2015 0325   NITRITE Negative  02/18/2021 1538   NITRITE NEGATIVE 05/28/2015 0325   LEUKOCYTESUR Negative 02/18/2021 1538    Pertinent Imaging:   Assessment & Plan: Reassess durability of Myrbetriq in 4 months Patient cleared for microscopic hematuria  1. Microhematuria  - Urinalysis, Complete   No follow-ups on file.  Martina Sinner, MD  [facility] [address], [address] [place], [place] [address] [phone]

## 2021-04-23 LAB — MICROSCOPIC EXAMINATION: Bacteria, UA: NONE SEEN

## 2021-04-23 LAB — URINALYSIS, COMPLETE
Bilirubin, UA: NEGATIVE
Glucose, UA: NEGATIVE
Ketones, UA: NEGATIVE
Leukocytes,UA: NEGATIVE
Nitrite, UA: NEGATIVE
Specific Gravity, UA: >1.03 — ABNORMAL HIGH (ref 1.005–1.030)
Urobilinogen, Ur: 0.2 mg/dL (ref 0.2–1.0)
pH, UA: 5.5 (ref 5.0–7.5)

## 2021-06-02 ENCOUNTER — Other Ambulatory Visit: Payer: Self-pay | Admitting: Internal Medicine

## 2021-06-02 DIAGNOSIS — M150 Primary generalized (osteo)arthritis: Secondary | ICD-10-CM

## 2021-06-02 DIAGNOSIS — G610 Guillain-Barre syndrome: Secondary | ICD-10-CM

## 2021-06-03 ENCOUNTER — Other Ambulatory Visit: Payer: Self-pay

## 2021-06-03 ENCOUNTER — Other Ambulatory Visit: Payer: Self-pay | Admitting: Physician Assistant

## 2021-06-03 DIAGNOSIS — R110 Nausea: Secondary | ICD-10-CM

## 2021-06-03 DIAGNOSIS — M150 Primary generalized (osteo)arthritis: Secondary | ICD-10-CM

## 2021-06-03 MED ORDER — TRAMADOL HCL 50 MG PO TABS: 100.0000 mg | ORAL_TABLET | Freq: Two times a day (BID) | ORAL | 1 refills | Status: DC | PRN

## 2021-06-03 MED ORDER — ONDANSETRON 4 MG PO TBDP: 4.0000 mg | ORAL_TABLET | Freq: Three times a day (TID) | ORAL | 1 refills | Status: DC | PRN

## 2021-07-11 ENCOUNTER — Other Ambulatory Visit: Payer: Self-pay

## 2021-07-11 ENCOUNTER — Ambulatory Visit: Payer: Commercial Managed Care - PPO | Admitting: Physician Assistant

## 2021-07-11 ENCOUNTER — Encounter: Payer: Self-pay | Admitting: Physician Assistant

## 2021-07-11 DIAGNOSIS — F411 Generalized anxiety disorder: Secondary | ICD-10-CM

## 2021-07-11 DIAGNOSIS — M150 Primary generalized (osteo)arthritis: Secondary | ICD-10-CM | POA: Diagnosis not present

## 2021-07-11 DIAGNOSIS — I10 Essential (primary) hypertension: Secondary | ICD-10-CM

## 2021-07-11 DIAGNOSIS — R5383 Other fatigue: Secondary | ICD-10-CM

## 2021-07-11 DIAGNOSIS — D509 Iron deficiency anemia, unspecified: Secondary | ICD-10-CM

## 2021-07-11 DIAGNOSIS — E559 Vitamin D deficiency, unspecified: Secondary | ICD-10-CM

## 2021-07-11 DIAGNOSIS — E782 Mixed hyperlipidemia: Secondary | ICD-10-CM | POA: Diagnosis not present

## 2021-07-11 DIAGNOSIS — F5101 Primary insomnia: Secondary | ICD-10-CM

## 2021-07-11 MED ORDER — TRAZODONE HCL 100 MG PO TABS: 100.0000 mg | ORAL_TABLET | Freq: Every evening | ORAL | 3 refills | Status: DC | PRN

## 2021-07-11 MED ORDER — ESCITALOPRAM OXALATE 20 MG PO TABS
20.0000 mg | ORAL_TABLET | Freq: Every day | ORAL | 2 refills | Status: DC
Start: 2021-07-11 — End: 2021-11-20

## 2021-07-11 MED ORDER — EZETIMIBE 10 MG PO TABS
10.0000 mg | ORAL_TABLET | Freq: Every day | ORAL | 3 refills | Status: DC
Start: 2021-07-11 — End: 2021-11-20

## 2021-07-11 MED ORDER — MELOXICAM 15 MG PO TABS: 15.0000 mg | ORAL_TABLET | Freq: Every day | ORAL | 3 refills | Status: DC

## 2021-07-11 MED ORDER — TRAMADOL HCL 50 MG PO TABS: 100.0000 mg | ORAL_TABLET | Freq: Two times a day (BID) | ORAL | 2 refills | Status: DC | PRN

## 2021-07-11 NOTE — Progress Notes (Signed)
[facility] [address] [place], [place] [address]  Internal MEDICINE  Office Visit Note  Patient Name: Alexandria Murray  [id number]  [id number]  Date of Service: 07/14/2021  Chief Complaint  Patient presents with   Follow-up    Refills, small lump under skin on right shoulder, has been there for at least a year but more noticeable now, discuss loss for words, noticed difficulty finding the correct words this week   Anxiety   Quality Metric Gaps    Colonoscopy     HPI Pt is here for routine follow up -Has a small bump on right shoulder that looks consistent with epidermal inclusion cyst. Non painful and not changing in size. Will continue to monitor -Sleep is ok. When back pain is bad it affects sleep. States she has gotten a pillow for her knees and new shoes and that seems to help. Takes trazodone as needed but not nightly -Takes lexapro every day and has been helping a lot, but does still have some frustration and may want to try to go up on dose. Will try doubling -Has not been taking lisinopril and bp at home has been 130/80. -Takes omeprazole OTC. Does get nauseous at times. -plans to do colonoscopy in March -Due for routine labs  Current Medication: Outpatient Encounter Medications as of 07/11/2021  Medication Sig   Aspirin-Acetaminophen-Caffeine (PAMPRIN MAX PO) Take 2 tablets by mouth daily.   docusate sodium (COLACE) 100 MG capsule Take 1 capsule (100 mg total) by mouth 2 (two) times daily.   escitalopram (LEXAPRO) 20 MG tablet Take 1 tablet (20 mg total) by mouth daily.   ferrous sulfate 325 (65 FE) MG tablet Take 1 tablet (325 mg total) by mouth daily.   folic acid (FOLVITE) 1 MG tablet Take 1 tablet (1 mg total) by mouth daily.   gabapentin (NEURONTIN) 100 MG capsule Take 1 capsule by mouth twice daily   mirabegron ER (MYRBETRIQ) 50 MG TB24 tablet Take 1 tablet (50 mg total) by mouth daily.   Multiple Vitamin (MULTIVITAMIN WITH MINERALS) TABS tablet Take  1 tablet by mouth daily.   Multiple Vitamins-Minerals (MULTIVITAMIN PO) Take 1 tablet by mouth daily.   ondansetron (ZOFRAN-ODT) 4 MG disintegrating tablet Take 1 tablet (4 mg total) by mouth every 8 (eight) hours as needed for nausea or vomiting.   Vitamin D, Ergocalciferol, (DRISDOL) 1.25 MG (50000 UNIT) CAPS capsule Take 1 capsule (50,000 Units total) by mouth every 7 (seven) days.   [DISCONTINUED] acetaminophen (TYLENOL) 500 MG tablet Take 1,000 mg by mouth every 6 (six) hours as needed for mild pain.   [DISCONTINUED] escitalopram (LEXAPRO) 10 MG tablet Take 1 tablet (10 mg total) by mouth daily.   [DISCONTINUED] ezetimibe (ZETIA) 10 MG tablet Take 1 tablet (10 mg total) by mouth daily.   [DISCONTINUED] lisinopril (ZESTRIL) 2.5 MG tablet Take 2 tablets (5 mg total) by mouth daily.   [DISCONTINUED] meloxicam (MOBIC) 15 MG tablet Take 1 tablet (15 mg total) by mouth daily.   [DISCONTINUED] traMADol (ULTRAM) 50 MG tablet Take 2 tablets (100 mg total) by mouth every 12 (twelve) hours as needed for moderate pain.   [DISCONTINUED] traZODone (DESYREL) 100 MG tablet Take 1 tablet (100 mg total) by mouth at bedtime as needed for sleep.   ezetimibe (ZETIA) 10 MG tablet Take 1 tablet (10 mg total) by mouth daily.   meloxicam (MOBIC) 15 MG tablet Take 1 tablet (15 mg total) by mouth daily.   traMADol (ULTRAM) 50 MG  tablet Take 2 tablets (100 mg total) by mouth every 12 (twelve) hours as needed for moderate pain.   traZODone (DESYREL) 100 MG tablet Take 1 tablet (100 mg total) by mouth at bedtime as needed for sleep.   No facility-administered encounter medications on file as of 07/11/2021.    Surgical History: Past Surgical History:  Procedure Laterality Date   CHOLECYSTECTOMY     COLPOSCOPY  07/26/2015   FINGER SURGERY      Medical History: Past Medical History:  Diagnosis Date   Abnormal Pap smear of cervix 06/20/2015   Anxiety    Cervical high risk human papillomavirus (HPV) DNA test  positive    Dysplasia of cervix, low grade (CIN 1) 07/26/2015   Guillain-Barre syndrome (HCC)    Nicotine dependence    Stroke (HCC)    Torn rotator cuff     Family History: Family History  Problem Relation Age of Onset   Cancer Mother    Stroke Paternal Grandfather     Social History   Socioeconomic History   Marital status: Married    Spouse name: Not on file   Number of children: Not on file   Years of education: Not on file   Highest education level: Not on file  Occupational History   Not on file  Tobacco Use   Smoking status: Every Day    Packs/day: 1.00    Types: Cigarettes   Smokeless tobacco: Never  Vaping Use   Vaping Use: Never used  Substance and Sexual Activity   Alcohol use: No   Drug use: No   Sexual activity: Yes    Birth control/protection: Post-menopausal  Other Topics Concern   Not on file  Social History Narrative   Not on file   Social Determinants of Health   Financial Resource Strain: Not on file  Food Insecurity: Not on file  Transportation Needs: Not on file  Physical Activity: Not on file  Stress: Not on file  Social Connections: Not on file  Intimate Partner Violence: Not on file      Review of Systems  Constitutional:  Positive for fatigue. Negative for chills and unexpected weight change.  HENT:  Negative for congestion, postnasal drip, rhinorrhea, sneezing and sore throat.   Eyes:  Negative for redness.  Respiratory:  Negative for cough, chest tightness and shortness of breath.   Cardiovascular:  Negative for chest pain and palpitations.  Gastrointestinal:  Negative for abdominal pain, constipation, diarrhea, nausea and vomiting.  Genitourinary:  Negative for dysuria and frequency.  Musculoskeletal:  Positive for back pain. Negative for arthralgias, joint swelling and neck pain.  Skin:  Negative for rash.  Neurological: Negative.  Negative for tremors and numbness.  Hematological:  Negative for adenopathy. Does not  bruise/bleed easily.  Psychiatric/Behavioral:  Positive for sleep disturbance. Negative for behavioral problems (Depression) and suicidal ideas. The patient is nervous/anxious.    Vital Signs: BP 124/80   Pulse 80   Temp 98.6 F (37 C)   Resp 16   Ht 5' (1.524 m)   Wt 161 lb 6.4 oz (73.2 kg)   LMP 02/02/2018 (Approximate)   SpO2 97%   BMI 31.52 kg/m    Physical Exam Vitals and nursing note reviewed.  Constitutional:      General: She is not in acute distress.    Appearance: She is well-developed. She is not diaphoretic.  HENT:     Head: Normocephalic and atraumatic.     Mouth/Throat:     Pharynx:  No oropharyngeal exudate.  Eyes:     Pupils: Pupils are equal, round, and reactive to light.  Neck:     Thyroid: No thyromegaly.     Vascular: No JVD.     Trachea: No tracheal deviation.  Cardiovascular:     Rate and Rhythm: Normal rate and regular rhythm.     Heart sounds: Normal heart sounds. No murmur heard.   No friction rub. No gallop.  Pulmonary:     Effort: Pulmonary effort is normal. No respiratory distress.     Breath sounds: No wheezing or rales.  Chest:     Chest wall: No tenderness.  Abdominal:     General: Bowel sounds are normal.     Palpations: Abdomen is soft.  Musculoskeletal:        General: Normal range of motion.     Cervical back: Normal range of motion and neck supple.  Lymphadenopathy:     Cervical: No cervical adenopathy.  Skin:    General: Skin is warm and dry.     Findings: Lesion present.     Comments: Small bump on right shoulder consistent with small cyst--nonTTP and no signs of infection  Neurological:     Mental Status: She is alert and oriented to person, place, and time.     Cranial Nerves: No cranial nerve deficit.  Psychiatric:        Behavior: Behavior normal.        Thought Content: Thought content normal.        Judgment: Judgment normal.       Assessment/Plan: 1. Generalized anxiety disorder Will increase lexapro to  20mg  - escitalopram (LEXAPRO) 20 MG tablet; Take 1 tablet (20 mg total) by mouth daily.  Dispense: 30 tablet; Refill: 2  2. Mixed hyperlipidemia Continue zetia and will update labs - ezetimibe (ZETIA) 10 MG tablet; Take 1 tablet (10 mg total) by mouth daily.  Dispense: 30 tablet; Refill: 3 - Lipid Panel With LDL/HDL Ratio  3. Essential hypertension Stable, continue to monitor  4. Primary generalized (osteo)arthritis May continue mobic and tramadol as needed - meloxicam (MOBIC) 15 MG tablet; Take 1 tablet (15 mg total) by mouth daily.  Dispense: 30 tablet; Refill: 3 - traMADol (ULTRAM) 50 MG tablet; Take 2 tablets (100 mg total) by mouth every 12 (twelve) hours as needed for moderate pain.  Dispense: 120 tablet; Refill: 2  5. Primary insomnia Continue trazodone before bed - traZODone (DESYREL) 100 MG tablet; Take 1 tablet (100 mg total) by mouth at bedtime as needed for sleep.  Dispense: 30 tablet; Refill: 3  6. Vitamin D deficiency - VITAMIN D 25 Hydroxy (Vit-D Deficiency, Fractures)  7. Iron deficiency anemia, unspecified iron deficiency anemia type - CBC w/Diff/Platelet - Fe+TIBC+Fer  8. Other fatigue - Comprehensive metabolic panel - CBC w/Diff/Platelet - TSH + free T4   General Counseling: Corry verbalizes understanding of the findings of todays visit and agrees with plan of treatment. I have discussed any further diagnostic evaluation that may be needed or ordered today. We also reviewed her medications today. she has been encouraged to call the office with any questions or concerns that should arise related to todays visit.    Orders Placed This Encounter  Procedures   Comprehensive metabolic panel   CBC w/Diff/Platelet   VITAMIN D 25 Hydroxy (Vit-D Deficiency, Fractures)   TSH + free T4   Lipid Panel With LDL/HDL Ratio   Fe+TIBC+Fer    Meds ordered this encounter  Medications  escitalopram (LEXAPRO) 20 MG tablet    Sig: Take 1 tablet (20 mg total) by mouth  daily.    Dispense:  30 tablet    Refill:  2   ezetimibe (ZETIA) 10 MG tablet    Sig: Take 1 tablet (10 mg total) by mouth daily.    Dispense:  30 tablet    Refill:  3    High cholesterol and history of stroke. Statins have caused excess joint and muscle pain.   meloxicam (MOBIC) 15 MG tablet    Sig: Take 1 tablet (15 mg total) by mouth daily.    Dispense:  30 tablet    Refill:  3    Please d/c etodolac due to negative side effects   traMADol (ULTRAM) 50 MG tablet    Sig: Take 2 tablets (100 mg total) by mouth every 12 (twelve) hours as needed for moderate pain.    Dispense:  120 tablet    Refill:  2   traZODone (DESYREL) 100 MG tablet    Sig: Take 1 tablet (100 mg total) by mouth at bedtime as needed for sleep.    Dispense:  30 tablet    Refill:  3    This patient was seen by Lynn Ito, PA-C in collaboration with Dr. Beverely Risen as a part of collaborative care agreement.   Total time spent:35 Minutes Time spent includes review of chart, medications, test results, and follow up plan with the patient.      Dr Lyndon Code Internal medicine

## 2021-09-01 ENCOUNTER — Other Ambulatory Visit: Payer: Self-pay | Admitting: Physician Assistant

## 2021-09-01 DIAGNOSIS — R110 Nausea: Secondary | ICD-10-CM

## 2021-09-02 ENCOUNTER — Ambulatory Visit: Payer: Self-pay | Admitting: Urology

## 2021-09-03 ENCOUNTER — Encounter: Payer: Self-pay | Admitting: Urology

## 2021-09-24 ENCOUNTER — Telehealth: Payer: Self-pay

## 2021-09-24 NOTE — Telephone Encounter (Signed)
Left vm to confirm 09/26/21 appointment-Toni  

## 2021-09-26 ENCOUNTER — Encounter: Payer: Commercial Managed Care - PPO | Admitting: Nurse Practitioner

## 2021-09-29 ENCOUNTER — Other Ambulatory Visit: Payer: Self-pay | Admitting: Internal Medicine

## 2021-09-29 DIAGNOSIS — G610 Guillain-Barre syndrome: Secondary | ICD-10-CM

## 2021-09-29 DIAGNOSIS — M150 Primary generalized (osteo)arthritis: Secondary | ICD-10-CM

## 2021-10-02 IMAGING — CT CT ABD-PEL WO/W CM
3 of 12 series · 11 of 46 positions shown, 17 images · IV contrast (omnipaque)
Comparison: None.

CLINICAL DATA: Hematuria with difficulty voiding and nocturia x6
months

EXAM:
CT ABDOMEN AND PELVIS WITHOUT AND WITH CONTRAST
TECHNIQUE: Multidetector CT imaging of the abdomen and pelvis was performed
following the standard protocol before and following the bolus
administration of intravenous contrast.
CONTRAST:  125mL OMNIPAQUE IOHEXOL 300 MG/ML  SOLN

[Series 5: cor without without pre 2.00 cor · coronal · non-contrast · 0.65mm/px · 2 of 145 slices shown, 3 images]
[im 49/145  soft-tissue]
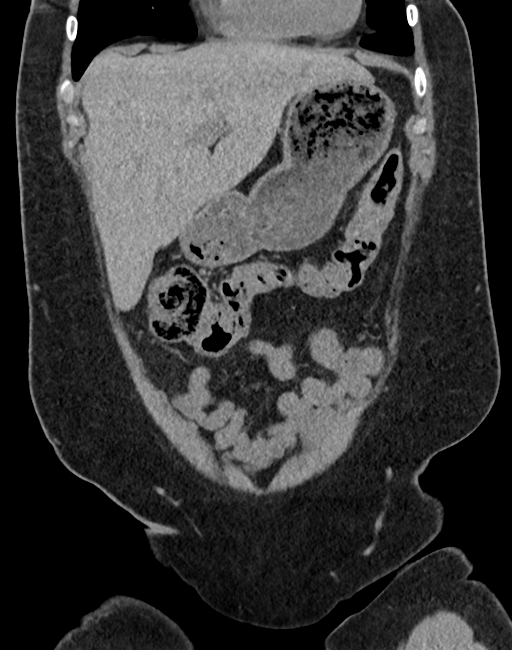
[im 49/145  bone]
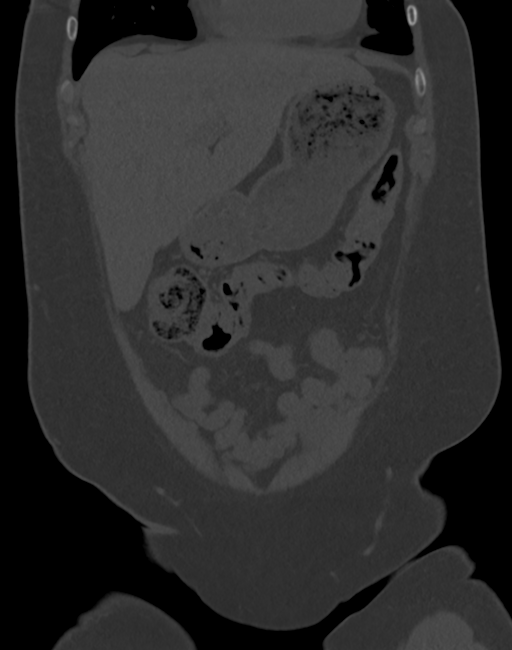
[im 97/145  soft-tissue]
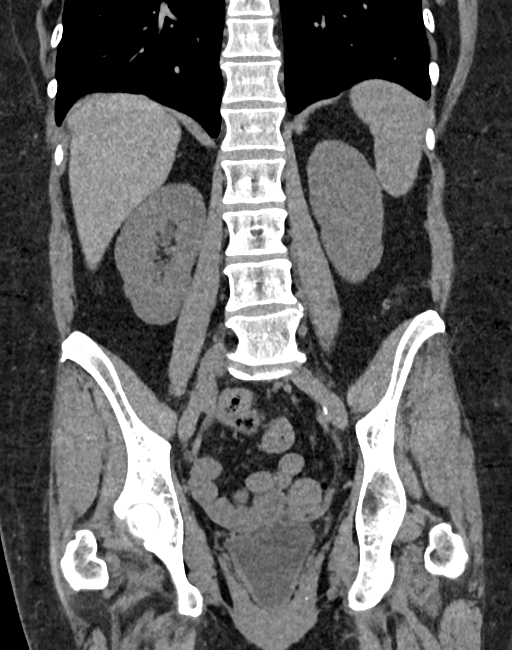

[Series 9: axial with hematuria with 5.00 · axial · 0.64mm/px · z∈[-1476,-1416]mm · 2 of 87 slices shown]
[im 13/87  soft-tissue]
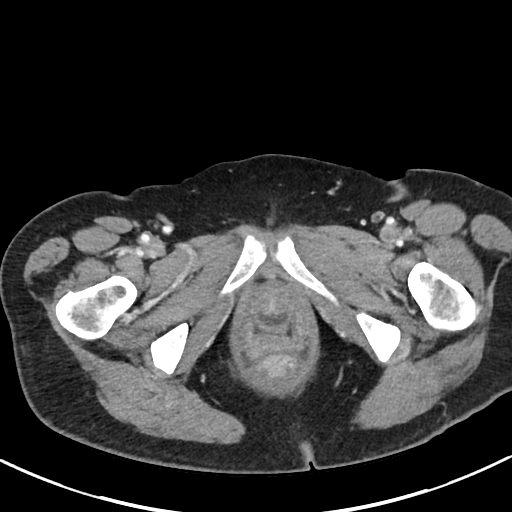
[im 25/87  soft-tissue]
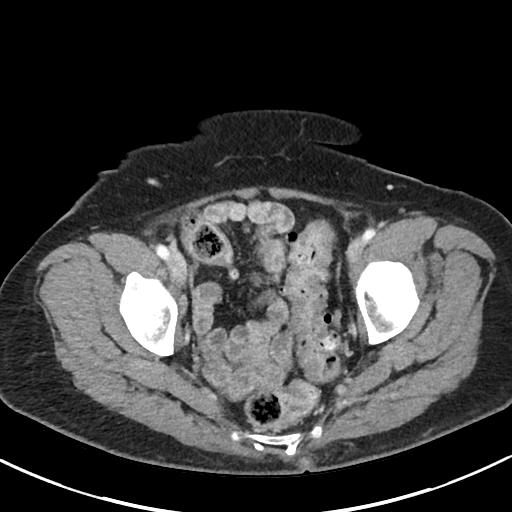

[Series 17: axial delay delay prone 5.00 · axial · delayed · 0.64mm/px · z∈[-1428,-1098]mm · 7 of 89 slices shown, 12 images]
[im 12/89  soft-tissue]
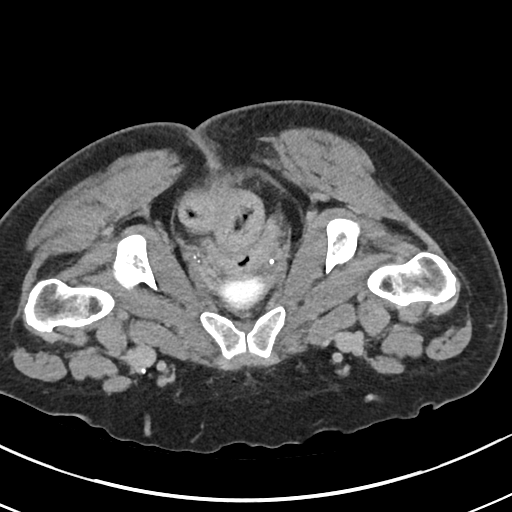
[im 12/89  bone]
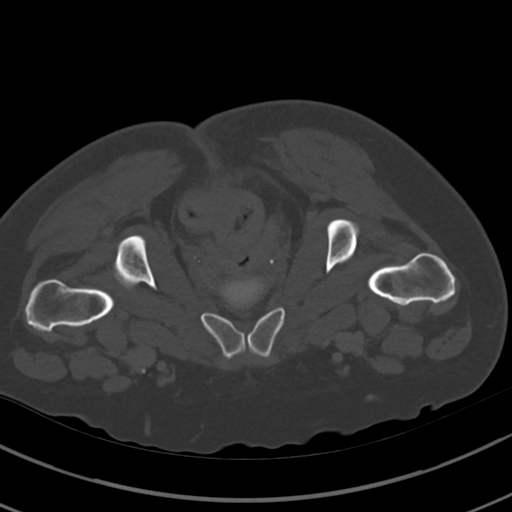
[im 23/89  soft-tissue]
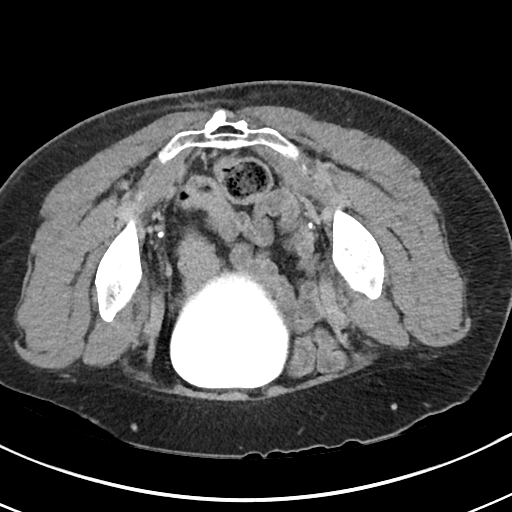
[im 34/89  soft-tissue]
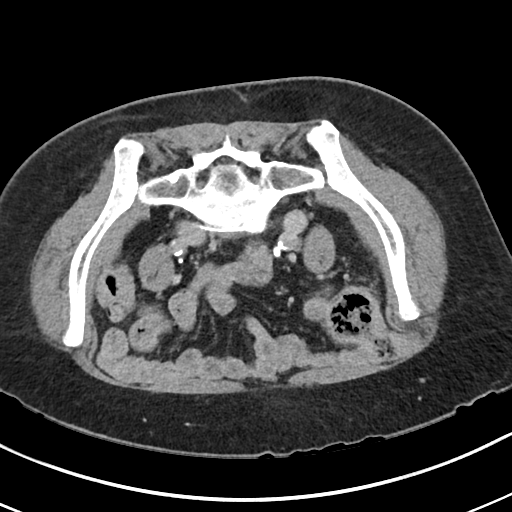
[im 45/89  soft-tissue]
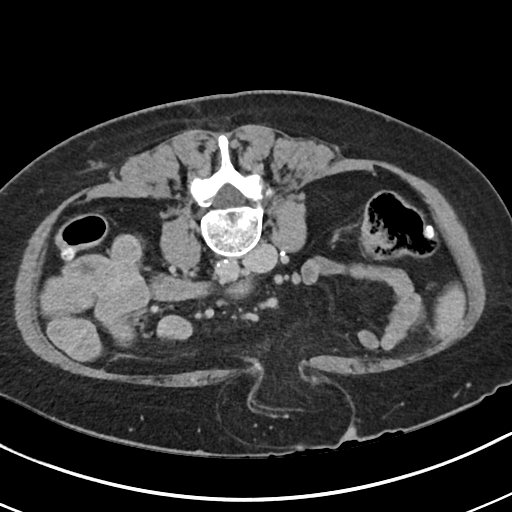
[im 45/89  lung]
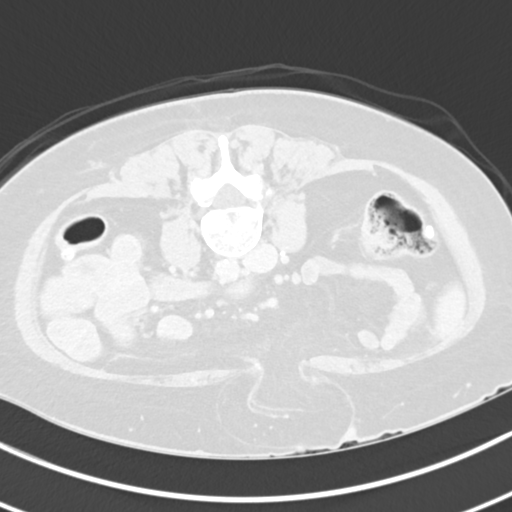
[im 56/89  soft-tissue]
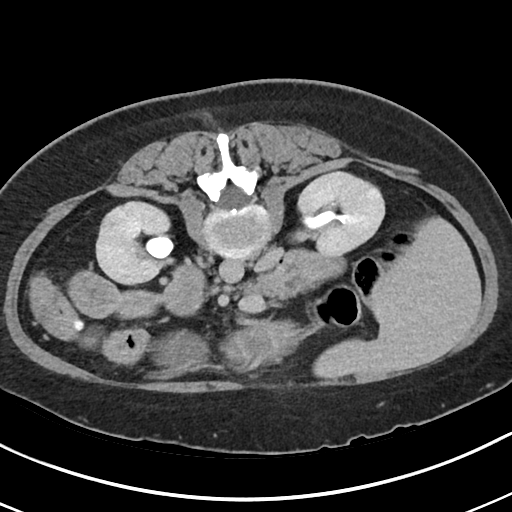
[im 56/89  lung]
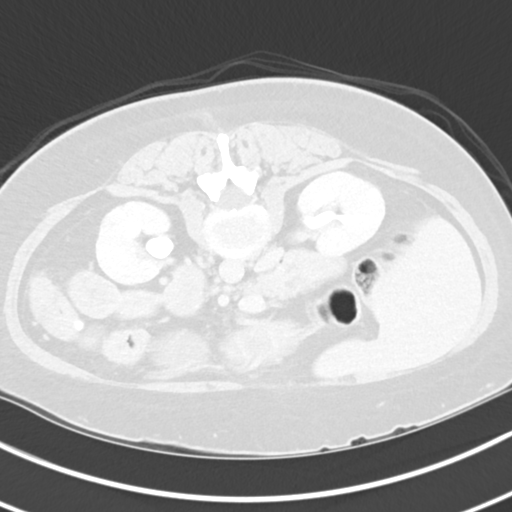
[im 67/89  soft-tissue]
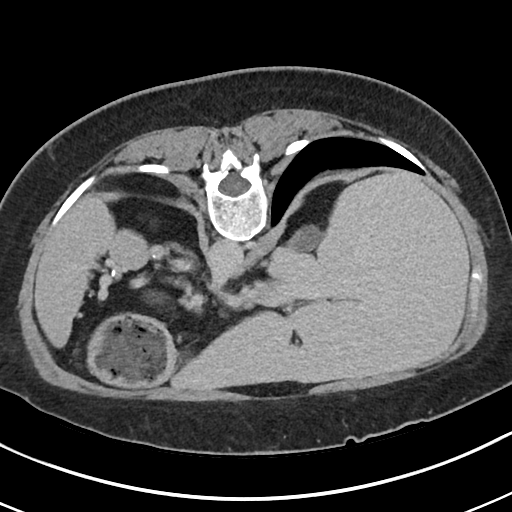
[im 67/89  lung]
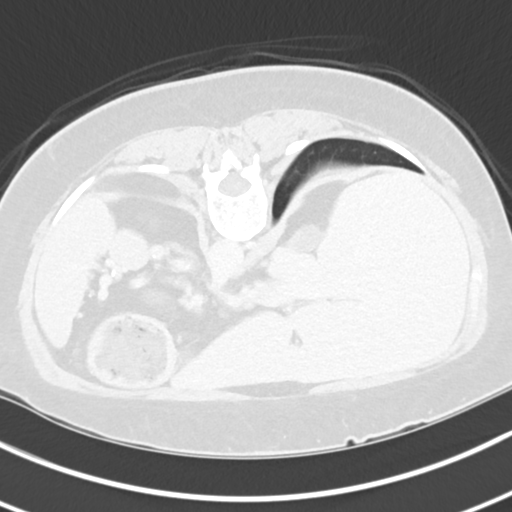
[im 78/89  soft-tissue]
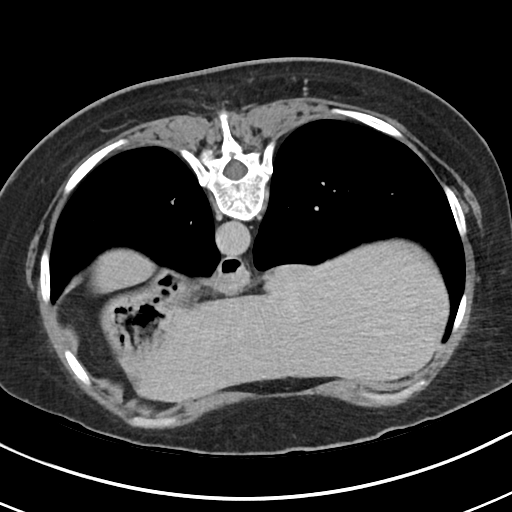
[im 78/89  lung]
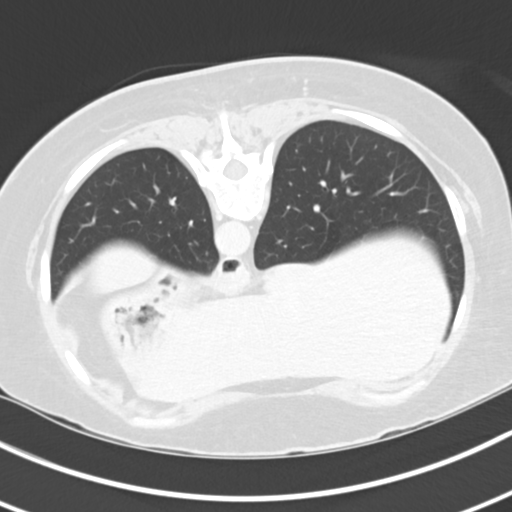

[11 of 46 positions shown; findings below may reference images not displayed]

FINDINGS: Lower chest: No acute abnormality. Normal size heart. No significant
pericardial effusion/thickening. Small hiatal hernia.

Hepatobiliary: No suspicious hepatic lesion. Calcific density along
the tip of the liver on image 45/2, possibly representing a dropped
gallstone. Gallbladder surgically absent. No biliary ductal
dilation.

Pancreas: Unremarkable. No pancreatic ductal dilatation or
surrounding inflammatory changes.

Spleen: Normal in size without focal abnormality.

Adrenals/Urinary Tract: 2.9 cm left adrenal nodule with imaging
characteristics consistent with an adrenal adenoma. 1.7 cm right
adrenal nodule with imaging characteristics consistent with an
adrenal adenoma.

No hydronephrosis. Symmetric enhancement excretion of contrast of
bilateral kidneys. No filling defect visualized within the opacified
portions of the collecting systems and ureter on delayed imaging.
However, the mid left ureter is not opacified limiting evaluation.
Punctate nonobstructive right renal stone. No ureteral or bladder
calculi. No solid enhancing renal lesions.

Urinary bladder is grossly unremarkable.

Stomach/Bowel: Small hiatal hernia otherwise the stomach is grossly
unremarkable. Duodenal diverticula. Normal positioning of the
duodenum/ligament of [place]. No suspicious small bowel wall
thickening or dilation. The appendix is gas filled with a few high
density foci which may represent small appendicoliths versus
ingested contents. Colonic diverticulosis without findings of acute
diverticulitis.

Vascular/Lymphatic: Aortic atherosclerosis without aneurysmal
dilation. No pathologically enlarged abdominal or pelvic lymph
nodes.

Reproductive: Uterus and bilateral adnexa are unremarkable.

Other: Large fat containing ventral hernia with a 2.1 cm aperture
width.

Musculoskeletal: L5-S1 discogenic disease. No acute osseous
abnormality.
IMPRESSION: 1. Punctate nonobstructive right renal stone. No hydronephrosis. No
ureteral or bladder calculi. No solid enhancing renal lesions.
2. Bilateral adrenal nodules consistent with adrenal adenomas.
Recommend correlation with laboratory values to assess for
functionality.
3. Large fat containing ventral hernia which likely accounts for
patient's worsening abdominal bulge.
4. Colonic diverticulosis without findings of acute diverticulitis.
5. Aortic atherosclerosis.

Aortic Atherosclerosis ([id number].E).

## 2021-11-19 ENCOUNTER — Telehealth: Payer: Self-pay

## 2021-11-19 NOTE — Telephone Encounter (Signed)
Left vm to confirm 11/20/21 appointment-Toni

## 2021-11-20 ENCOUNTER — Telehealth: Payer: Self-pay

## 2021-11-20 ENCOUNTER — Ambulatory Visit (INDEPENDENT_AMBULATORY_CARE_PROVIDER_SITE_OTHER): Payer: Commercial Managed Care - PPO | Admitting: Nurse Practitioner

## 2021-11-20 ENCOUNTER — Other Ambulatory Visit: Payer: Self-pay

## 2021-11-20 ENCOUNTER — Encounter: Payer: Self-pay | Admitting: Nurse Practitioner

## 2021-11-20 VITALS — BP 140/78 | HR 97 | Temp 98.0°F | Resp 16 | Ht 60.0 in | Wt 164.4 lb

## 2021-11-20 DIAGNOSIS — E559 Vitamin D deficiency, unspecified: Secondary | ICD-10-CM | POA: Diagnosis not present

## 2021-11-20 DIAGNOSIS — I10 Essential (primary) hypertension: Secondary | ICD-10-CM | POA: Diagnosis not present

## 2021-11-20 DIAGNOSIS — Z0001 Encounter for general adult medical examination with abnormal findings: Secondary | ICD-10-CM

## 2021-11-20 DIAGNOSIS — Z1212 Encounter for screening for malignant neoplasm of rectum: Secondary | ICD-10-CM

## 2021-11-20 DIAGNOSIS — E782 Mixed hyperlipidemia: Secondary | ICD-10-CM

## 2021-11-20 DIAGNOSIS — F411 Generalized anxiety disorder: Secondary | ICD-10-CM

## 2021-11-20 DIAGNOSIS — R300 Dysuria: Secondary | ICD-10-CM

## 2021-11-20 DIAGNOSIS — D509 Iron deficiency anemia, unspecified: Secondary | ICD-10-CM

## 2021-11-20 DIAGNOSIS — Z1231 Encounter for screening mammogram for malignant neoplasm of breast: Secondary | ICD-10-CM

## 2021-11-20 DIAGNOSIS — R110 Nausea: Secondary | ICD-10-CM

## 2021-11-20 DIAGNOSIS — Z1211 Encounter for screening for malignant neoplasm of colon: Secondary | ICD-10-CM

## 2021-11-20 DIAGNOSIS — M150 Primary generalized (osteo)arthritis: Secondary | ICD-10-CM | POA: Diagnosis not present

## 2021-11-20 DIAGNOSIS — F5101 Primary insomnia: Secondary | ICD-10-CM

## 2021-11-20 MED ORDER — EZETIMIBE 10 MG PO TABS: 10.0000 mg | ORAL_TABLET | Freq: Every day | ORAL | 3 refills | Status: DC

## 2021-11-20 MED ORDER — GABAPENTIN 100 MG PO CAPS: 100.0000 mg | ORAL_CAPSULE | Freq: Two times a day (BID) | ORAL | 3 refills | Status: DC

## 2021-11-20 MED ORDER — ESCITALOPRAM OXALATE 20 MG PO TABS: 20.0000 mg | ORAL_TABLET | Freq: Every day | ORAL | 2 refills | Status: DC

## 2021-11-20 MED ORDER — TRAZODONE HCL 100 MG PO TABS: 100.0000 mg | ORAL_TABLET | Freq: Every evening | ORAL | 3 refills | Status: DC | PRN

## 2021-11-20 MED ORDER — FOLIC ACID 1 MG PO TABS: 1.0000 mg | ORAL_TABLET | Freq: Every day | ORAL | 3 refills | Status: DC

## 2021-11-20 MED ORDER — TRAMADOL HCL 50 MG PO TABS: 100.0000 mg | ORAL_TABLET | Freq: Two times a day (BID) | ORAL | 2 refills | Status: DC | PRN

## 2021-11-20 MED ORDER — VITAMIN D (ERGOCALCIFEROL) 1.25 MG (50000 UNIT) PO CAPS: 50000.0000 [IU] | ORAL_CAPSULE | ORAL | 0 refills | Status: DC

## 2021-11-20 MED ORDER — MELOXICAM 15 MG PO TABS: 15.0000 mg | ORAL_TABLET | Freq: Every day | ORAL | 3 refills | Status: DC

## 2021-11-20 MED ORDER — ONDANSETRON 4 MG PO TBDP: 4.0000 mg | ORAL_TABLET | Freq: Three times a day (TID) | ORAL | 1 refills | Status: DC | PRN

## 2021-11-20 NOTE — Telephone Encounter (Signed)
CALLED PATIENT NO ANSWER LEFT VOICEMAIL FOR A CALL BACK ? ?

## 2021-11-20 NOTE — Progress Notes (Signed)
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 She is a well appearing 46 yo female with hypertension, generalized anxiety disorder and osteoarthritis. She wears dentures but only has the uppers. She is requesting a doctor's note to be able to wear ear buds at work because it decreases her anxiety and helps her concentrate on her work. Her last pap smear was unsatisfactory. Patient reports that paps usually result as unsat due to her genital warts but she has had biopsies done which have been normal. Blood pressure is stable. She is due for screening mammogram and screening colonoscopy.     Breast exam done.    Current Medication: Outpatient Encounter Medications as of 11/20/2021  Medication Sig   Aspirin-Acetaminophen-Caffeine (PAMPRIN MAX PO) Take 2 tablets by mouth daily.   docusate sodium (COLACE) 100 MG capsule Take 1 capsule (100 mg total) by mouth 2 (two) times daily.   ferrous sulfate 325 (65 FE) MG tablet Take 1 tablet (325 mg total) by mouth daily.   mirabegron ER (MYRBETRIQ) 50 MG TB24 tablet Take 1 tablet (50 mg total) by mouth daily.   Multiple Vitamin (MULTIVITAMIN WITH MINERALS) TABS tablet Take 1 tablet by mouth daily.   Multiple Vitamins-Minerals (MULTIVITAMIN PO) Take 1 tablet by mouth daily.   [DISCONTINUED] escitalopram (LEXAPRO) 20 MG tablet Take 1 tablet (20 mg total) by mouth daily.   [DISCONTINUED] ezetimibe (ZETIA) 10 MG tablet Take 1 tablet (10 mg total) by mouth daily.   [DISCONTINUED] folic acid (FOLVITE) 1 MG tablet Take 1 tablet (1 mg total) by mouth daily.   [DISCONTINUED] gabapentin (NEURONTIN) 100 MG capsule  Take 1 capsule by mouth twice daily   [DISCONTINUED] meloxicam (MOBIC) 15 MG tablet Take 1 tablet (15 mg total) by mouth daily.   [DISCONTINUED] ondansetron (ZOFRAN-ODT) 4 MG disintegrating tablet DISSOLVE 1 TABLET IN MOUTH EVERY 8 HOURS AS NEEDED FOR NAUSEA FOR VOMITING   [DISCONTINUED] traMADol (ULTRAM) 50 MG tablet Take 2 tablets (100 mg total) by mouth every 12 (twelve) hours as needed for moderate pain.   [DISCONTINUED] traZODone (DESYREL) 100 MG tablet Take 1 tablet (100 mg total) by mouth at bedtime as needed for sleep.   [DISCONTINUED] Vitamin D, Ergocalciferol, (DRISDOL) 1.25 MG (50000 UNIT) CAPS capsule Take 1 capsule (50,000 Units total) by mouth every 7 (seven) days.   escitalopram (LEXAPRO) 20 MG tablet Take 1 tablet (20 mg total) by mouth daily.   ezetimibe (ZETIA) 10 MG tablet Take 1 tablet (10 mg total) by mouth daily.   folic acid (FOLVITE) 1 MG tablet Take 1 tablet (1 mg total) by mouth daily.   gabapentin (NEURONTIN) 100 MG capsule Take 1 capsule (100 mg total) by mouth 2 (two) times daily.   meloxicam (MOBIC) 15 MG tablet Take 1 tablet (15 mg total) by mouth daily.   ondansetron (ZOFRAN-ODT) 4 MG disintegrating tablet Take 1 tablet (4 mg total) by mouth every 8 (eight) hours as needed for nausea or vomiting.   traMADol (ULTRAM) 50 MG tablet Take 2 tablets (100 mg total) by mouth every 12 (twelve) hours as needed for moderate  pain.   traZODone (DESYREL) 100 MG tablet Take 1 tablet (100 mg total) by mouth at bedtime as needed for sleep.   Vitamin D, Ergocalciferol, (DRISDOL) 1.25 MG (50000 UNIT) CAPS capsule Take 1 capsule (50,000 Units total) by mouth every 7 (seven) days.   No facility-administered encounter medications on file as of 11/20/2021.    Surgical History: Past Surgical History:  Procedure Laterality Date   CHOLECYSTECTOMY     COLPOSCOPY  07/26/2015   FINGER SURGERY      Medical History: Past Medical History:  Diagnosis Date   Abnormal Pap smear of cervix  06/20/2015   Anxiety    Cervical high risk human papillomavirus (HPV) DNA test positive    Dysplasia of cervix, low grade (CIN 1) 07/26/2015   Guillain-Barre syndrome (HCC)    Nicotine dependence    Stroke ([place])    Torn rotator cuff     Family History: Family History  Problem Relation Age of Onset   Cancer Mother    Stroke Paternal Grandfather     Social History   Socioeconomic History   Marital status: Married    Spouse name: Not on file   Number of children: Not on file   Years of education: Not on file   Highest education level: Not on file  Occupational History   Not on file  Tobacco Use   Smoking status: Every Day    Packs/day: 1.00    Types: Cigarettes   Smokeless tobacco: Never  Vaping Use   Vaping Use: Never used  Substance and Sexual Activity   Alcohol use: No   Drug use: No   Sexual activity: Yes    Birth control/protection: Post-menopausal  Other Topics Concern   Not on file  Social History Narrative   Not on file   Social Determinants of Health   Financial Resource Strain: Not on file  Food Insecurity: Not on file  Transportation Needs: Not on file  Physical Activity: Not on file  Stress: Not on file  Social Connections: Not on file  Intimate Partner Violence: Not on file      Review of Systems  Constitutional:  Negative for activity change, appetite change, chills, fatigue, fever and unexpected weight change.  HENT: Negative.  Negative for congestion, ear pain, rhinorrhea, sore throat and trouble swallowing.   Eyes: Negative.   Respiratory: Negative.  Negative for cough, chest tightness, shortness of breath and wheezing.   Cardiovascular: Negative.  Negative for chest pain and palpitations.  Gastrointestinal: Negative.  Negative for abdominal pain, blood in stool, constipation, diarrhea, nausea and vomiting.  Endocrine: Negative.   Genitourinary: Negative.  Negative for difficulty urinating, dysuria, frequency, hematuria and urgency.   Musculoskeletal: Negative.  Negative for arthralgias, back pain, joint swelling, myalgias and neck pain.  Skin: Negative.  Negative for rash and wound.  Allergic/Immunologic: Negative.  Negative for immunocompromised state.  Neurological: Negative.  Negative for dizziness, seizures, numbness and headaches.  Hematological: Negative.   Psychiatric/Behavioral:  Negative for behavioral problems, self-injury, sleep disturbance and suicidal ideas. The patient is nervous/anxious.    Vital Signs: BP 140/78 Comment: 146/88   Pulse 97    Temp 98 F (36.7 C)    Resp 16    Ht 5' (1.524 m)    Wt 164 lb 6.4 oz (74.6 kg)    LMP 02/02/2018 (Approximate)    SpO2 97%    BMI 32.11 kg/m    Physical Exam Vitals reviewed.  Constitutional:      General:  She is awake. She is not in acute distress.    Appearance: Normal appearance. She is well-developed and well-groomed. She is obese. She is not ill-appearing or diaphoretic.  HENT:     Head: Normocephalic and atraumatic.     Right Ear: Tympanic membrane, ear canal and external ear normal.     Left Ear: Tympanic membrane, ear canal and external ear normal.     Nose: Nose normal. No congestion or rhinorrhea.     Mouth/Throat:     Lips: Pink.     Mouth: Mucous membranes are moist.     Dentition: Abnormal dentition. Has dentures.     Pharynx: Oropharynx is clear. Uvula midline. No oropharyngeal exudate or posterior oropharyngeal erythema.  Eyes:     General: Lids are normal. Vision grossly intact. Gaze aligned appropriately. No scleral icterus.       Right eye: No discharge.        Left eye: No discharge.     Extraocular Movements: Extraocular movements intact.     Conjunctiva/sclera: Conjunctivae normal.     Pupils: Pupils are equal, round, and reactive to light.     Funduscopic exam:    Right eye: Red reflex present.        Left eye: Red reflex present. Neck:     Thyroid: No thyromegaly.     Vascular: No carotid bruit or JVD.     Trachea: No tracheal  deviation.  Cardiovascular:     Rate and Rhythm: Normal rate and regular rhythm.     Pulses: Normal pulses.     Heart sounds: Normal heart sounds, S1 normal and S2 normal. No murmur heard.   No friction rub. No gallop.  Pulmonary:     Effort: Pulmonary effort is normal. No accessory muscle usage or respiratory distress.     Breath sounds: Normal breath sounds and air entry. No stridor. No wheezing or rales.  Chest:     Chest wall: No tenderness.  Breasts:    Right: Normal. No swelling, bleeding, inverted nipple, mass, nipple discharge, skin change or tenderness.     Left: Normal. No swelling, bleeding, inverted nipple, mass, nipple discharge, skin change or tenderness.  Abdominal:     General: Bowel sounds are normal. There is no distension.     Palpations: Abdomen is soft. There is no shifting dullness, fluid wave, mass or pulsatile mass.     Tenderness: There is no abdominal tenderness. There is no guarding or rebound.  Musculoskeletal:        General: No tenderness or deformity. Normal range of motion.     Cervical back: Normal range of motion and neck supple.     Right lower leg: No edema.     Left lower leg: No edema.  Lymphadenopathy:     Cervical: No cervical adenopathy.     Upper Body:     Right upper body: No supraclavicular, axillary or pectoral adenopathy.     Left upper body: No supraclavicular, axillary or pectoral adenopathy.  Skin:    General: Skin is warm and dry.     Capillary Refill: Capillary refill takes less than 2 seconds.     Coloration: Skin is not pale.     Findings: No erythema or rash.  Neurological:     Mental Status: She is alert and oriented to person, place, and time.     Cranial Nerves: No cranial nerve deficit.     Motor: No abnormal muscle tone.     Coordination: Coordination  normal.     Gait: Gait normal.     Deep Tendon Reflexes: Reflexes are normal and symmetric.  Psychiatric:        Mood and Affect: Mood and affect normal.         Behavior: Behavior normal. Behavior is cooperative.        Thought Content: Thought content normal.        Judgment: Judgment normal.       Assessment/Plan: 1. Encounter for general adult medical examination with abnormal findings Age-appropriate preventive screenings and vaccinations discussed, annual physical exam completed. Routine labs for health maintenance ordered in September, patient reminded to have her labs drawn. PHM updated.   2. Essential hypertension Blood pressure is stable, not currently on any medications for blood pressure.   3. Primary generalized (osteo)arthritis Refills ordered.  - meloxicam (MOBIC) 15 MG tablet; Take 1 tablet (15 mg total) by mouth daily.  Dispense: 30 tablet; Refill: 3 - traMADol (ULTRAM) 50 MG tablet; Take 2 tablets (100 mg total) by mouth every 12 (twelve) hours as needed for moderate pain.  Dispense: 120 tablet; Refill: 2 - gabapentin (NEURONTIN) 100 MG capsule; Take 1 capsule (100 mg total) by mouth 2 (two) times daily.  Dispense: 60 capsule; Refill: 3  4. Iron deficiency anemia, unspecified iron deficiency anemia type Refills ordered - folic acid (FOLVITE) 1 MG tablet; Take 1 tablet (1 mg total) by mouth daily.  Dispense: 30 tablet; Refill: 3  5. Vitamin D deficiency Refills ordered, patient reminded to have labs drawn - Vitamin D, Ergocalciferol, (DRISDOL) 1.25 MG (50000 UNIT) CAPS capsule; Take 1 capsule (50,000 Units total) by mouth every 7 (seven) days.  Dispense: 5 capsule; Refill: 0  6. Mixed hyperlipidemia Refills ordered, patient reminded to have labs drawn - ezetimibe (ZETIA) 10 MG tablet; Take 1 tablet (10 mg total) by mouth daily.  Dispense: 30 tablet; Refill: 3  7. Nausea Refills ordered - ondansetron (ZOFRAN-ODT) 4 MG disintegrating tablet; Take 1 tablet (4 mg total) by mouth every 8 (eight) hours as needed for nausea or vomiting.  Dispense: 30 tablet; Refill: 1  8. Dysuria Routine urinalysis done - UA/M w/rflx Culture,  Routine - Microscopic Examination - Urine Culture, Reflex  9. Encounter for screening mammogram for malignant neoplasm of breast Routine mammogram ordered. - MM 3D SCREEN BREAST BILATERAL; Future  10. Screening for colorectal cancer Refer to GI for routine colonoscopy - Ambulatory referral to Gastroenterology  11. Primary insomnia Takes trazodone for sleep, refills ordered.  - traZODone (DESYREL) 100 MG tablet; Take 1 tablet (100 mg total) by mouth at bedtime as needed for sleep.  Dispense: 30 tablet; Refill: 3  12. Generalized anxiety disorder Stable, citalopram refills ordered.  - escitalopram (LEXAPRO) 20 MG tablet; Take 1 tablet (20 mg total) by mouth daily.  Dispense: 30 tablet; Refill: 2      General Counseling: Alexandria Murray verbalizes understanding of the findings of todays visit and agrees with plan of treatment. I have discussed any further diagnostic evaluation that may be needed or ordered today. We also reviewed her medications today. she has been encouraged to call the office with any questions or concerns that should arise related to todays visit.    Orders Placed This Encounter  Procedures   Microscopic Examination   Urine Culture, Reflex   MM 3D SCREEN BREAST BILATERAL   UA/M w/rflx Culture, Routine   Ambulatory referral to Gastroenterology    Meds ordered this encounter  Medications   Vitamin D, Ergocalciferol, (DRISDOL)  1.25 MG (50000 UNIT) CAPS capsule    Sig: Take 1 capsule (50,000 Units total) by mouth every 7 (seven) days.    Dispense:  5 capsule    Refill:  0   escitalopram (LEXAPRO) 20 MG tablet    Sig: Take 1 tablet (20 mg total) by mouth daily.    Dispense:  30 tablet    Refill:  2   meloxicam (MOBIC) 15 MG tablet    Sig: Take 1 tablet (15 mg total) by mouth daily.    Dispense:  30 tablet    Refill:  3    Please d/c etodolac due to negative side effects   traMADol (ULTRAM) 50 MG tablet    Sig: Take 2 tablets (100 mg total) by mouth every 12  (twelve) hours as needed for moderate pain.    Dispense:  120 tablet    Refill:  2   traZODone (DESYREL) 100 MG tablet    Sig: Take 1 tablet (100 mg total) by mouth at bedtime as needed for sleep.    Dispense:  30 tablet    Refill:  3   ezetimibe (ZETIA) 10 MG tablet    Sig: Take 1 tablet (10 mg total) by mouth daily.    Dispense:  30 tablet    Refill:  3    High cholesterol and history of stroke. Statins have caused excess joint and muscle pain.   folic acid (FOLVITE) 1 MG tablet    Sig: Take 1 tablet (1 mg total) by mouth daily.    Dispense:  30 tablet    Refill:  3   ondansetron (ZOFRAN-ODT) 4 MG disintegrating tablet    Sig: Take 1 tablet (4 mg total) by mouth every 8 (eight) hours as needed for nausea or vomiting.    Dispense:  30 tablet    Refill:  1   gabapentin (NEURONTIN) 100 MG capsule    Sig: Take 1 capsule (100 mg total) by mouth 2 (two) times daily.    Dispense:  60 capsule    Refill:  3    Return in about 3 months (around 02/18/2022) for F/U, anxiety med refill with Ahyan Kreeger or lauren.   Total time spent:30 Minutes Time spent includes review of chart, medications, test results, and follow up plan with the patient.   [place] Controlled Substance Database was reviewed by me.  This patient was seen by Jonetta Osgood, FNP-C in collaboration with Dr. Clayborn Bigness as a part of collaborative care agreement.  Amilah Greenspan R. Valetta Fuller, MSN, FNP-C Internal medicine

## 2021-11-21 ENCOUNTER — Telehealth: Payer: Self-pay

## 2021-11-21 NOTE — Telephone Encounter (Signed)
CALLED PATIENT NO ANSWER LEFT VOICEMAIL FOR A CALL BACK °Letter sent °

## 2021-11-23 LAB — MICROSCOPIC EXAMINATION
Bacteria, UA: NONE SEEN
Casts: NONE SEEN /lpf

## 2021-11-23 LAB — UA/M W/RFLX CULTURE, ROUTINE
Bilirubin, UA: NEGATIVE
Glucose, UA: NEGATIVE
Ketones, UA: NEGATIVE
Nitrite, UA: NEGATIVE
Specific Gravity, UA: 1.017 (ref 1.005–1.030)
Urobilinogen, Ur: 0.2 mg/dL (ref 0.2–1.0)
pH, UA: 5.5 (ref 5.0–7.5)

## 2021-11-23 LAB — URINE CULTURE, REFLEX

## 2021-11-27 ENCOUNTER — Telehealth: Payer: Self-pay

## 2021-11-27 NOTE — Telephone Encounter (Signed)
CALLED PATIENT NO ANSWER LEFT VOICEMAIL FOR A CALL BACK °Letter sent °

## 2021-11-27 NOTE — Telephone Encounter (Signed)
error 

## 2021-11-29 ENCOUNTER — Telehealth: Payer: Self-pay

## 2021-11-29 NOTE — Telephone Encounter (Signed)
Work note at the front desk ready for patient to pickup. Patient has been advised.

## 2021-12-07 ENCOUNTER — Encounter: Payer: Self-pay | Admitting: Nurse Practitioner

## 2021-12-17 ENCOUNTER — Telehealth: Payer: Self-pay

## 2021-12-17 NOTE — Telephone Encounter (Signed)
LMOM reminding pt to complete blood work. Pts refill for drisdol is pending the results

## 2022-01-11 ENCOUNTER — Encounter: Payer: Self-pay | Admitting: Intensive Care

## 2022-01-11 ENCOUNTER — Emergency Department: Payer: Self-pay

## 2022-01-11 ENCOUNTER — Other Ambulatory Visit: Payer: Self-pay

## 2022-01-11 ENCOUNTER — Emergency Department
Admission: EM | Admit: 2022-01-11 | Discharge: 2022-01-11 | Disposition: A | Discharge status: Home / Self Care | Payer: Self-pay | Attending: Emergency Medicine | Admitting: Emergency Medicine

## 2022-01-11 DIAGNOSIS — S42401A Unspecified fracture of lower end of right humerus, initial encounter for closed fracture: Secondary | ICD-10-CM | POA: Insufficient documentation

## 2022-01-11 DIAGNOSIS — Y9351 Activity, roller skating (inline) and skateboarding: Secondary | ICD-10-CM | POA: Insufficient documentation

## 2022-01-11 MED ORDER — PROPOFOL 10 MG/ML IV BOLUS
0.5000 mg/kg | Freq: Once | INTRAVENOUS | Status: AC
Start: 2022-01-11 — End: 2022-01-11
  Administered 2022-01-11: 37.4 mg via INTRAVENOUS
  Filled 2022-01-11: qty 20

## 2022-01-11 MED ORDER — OXYCODONE-ACETAMINOPHEN 5-325 MG PO TABS
1.0000 | ORAL_TABLET | Freq: Once | ORAL | Status: AC
  Administered 2022-01-11: 1 via ORAL
  Filled 2022-01-11: qty 1

## 2022-01-11 MED ORDER — PROPOFOL 10 MG/ML IV BOLUS
1.0000 mg/kg | Freq: Once | INTRAVENOUS | Status: AC
Start: 2022-01-11 — End: 2022-01-11
  Administered 2022-01-11: 37.4 mg via INTRAVENOUS
  Filled 2022-01-11: qty 20

## 2022-01-11 MED ORDER — PROPOFOL 10 MG/ML IV BOLUS
INTRAVENOUS | Status: AC | PRN
  Administered 2022-01-11: 37.4 mg via INTRAVENOUS

## 2022-01-11 MED ORDER — SODIUM CHLORIDE 0.9 % IV BOLUS
1000.0000 mL | Freq: Once | INTRAVENOUS | Status: AC
  Administered 2022-01-11: 1000 mL via INTRAVENOUS

## 2022-01-11 NOTE — ED Notes (Signed)
Provider at bedside for D/C instructions. Pt verbalizes she is not driving home, that son present at bedside will be driving. Pt educated on risk associated with driving while impaired from sedation medications and verbalizes understanding. Verbalizes provided PO pain medication will be taken at home and not while driving.  ?

## 2022-01-11 NOTE — Sedation Documentation (Addendum)
Propofol 37.4mg  given at this time ?

## 2022-01-11 NOTE — Discharge Instructions (Addendum)
Take the prescription pain medicine as needed. Apply ice over the splint to reduce swelling. Keep the splint clean and dry. Call Dr. Roland Rack to schedule a follow-up appointment with his office next week.  ?

## 2022-01-11 NOTE — ED Notes (Signed)
Propofol handed off to amber RN, 200 mg/20 ml's total.  ?

## 2022-01-11 NOTE — ED Provider Notes (Addendum)
? ? ?[facility] ?Emergency Department Provider Note ? ? ? ? Event Date/Time  ? First MD Initiated Contact with Patient 01/11/22 1531   ?  (approximate) ? ? ?History  ? ?Arm Pain ? ? ?HPI ? ?Alexandria Hinton DyerJ Ginyard is a 46 y.o. female with a history of stroke, anxiety, and Guillain-Barr? syndrome presents to the ED following mechanical fall.  Patient was apparently rollerskating, when she injured her right elbow after falling on it.  She denies any head injury or LOC. ?  ? ? ?Physical Exam  ? ?Triage Vital Signs: ?ED Triage Vitals  ?Enc Vitals Group  ?   BP 01/11/22 1530 (!) 125/97  ?   Pulse Rate 01/11/22 1530 (!) 111  ?   Resp 01/11/22 1530 19  ?   Temp 01/11/22 1530 99.5 ?F (37.5 ?C)  ?   Temp Source 01/11/22 1530 Oral  ?   SpO2 01/11/22 1530 95 %  ?   Weight --   ?   Height 01/11/22 1529 5' (1.524 m)  ?   Head Circumference --   ?   Peak Flow --   ?   Pain Score 01/11/22 1529 10  ?   Pain Loc --   ?   Pain Edu? --   ?   Excl. in GC? --   ? ? ?Most recent vital signs: ?Vitals:  ? 01/11/22 1841 01/11/22 2001  ?BP: 136/83 133/65  ?Pulse: 93 86  ?Resp: 18 18  ?Temp:  98.2 ?F (36.8 ?C)  ?SpO2: 98% 99%  ? ? ?General Awake, no distress.  ?HEENT NCAT. PERRL. EOMI. No rhinorrhea. Mucous membranes are moist.  ?CV:  Good peripheral perfusion. RRR ?RESP:  Normal effort. CTA ?ABD:  No distention.  ?MSK:  Right elbow with deformity and effusion. Normal composite fist distally. Normal gross sensation ? ? ?ED Results / Procedures / Treatments  ? ?Labs ?(all labs ordered are listed, but only abnormal results are displayed) ?Labs Reviewed - No data to display ? ? ?EKG ? ? ?RADIOLOGY ? ?I personally viewed and evaluated these images as part of my medical decision making, as well as reviewing the written report by the radiologist. ? ?ED Provider Interpretation: posterior elbow dislocation with fracture fragments  ? ?DG Elbow 2 Views Right ? ?Result Date: 01/11/2022 ?CLINICAL DATA:  Right elbow dislocation, post reduction  imaging EXAM: RIGHT ELBOW - 2 VIEW COMPARISON:  None. FINDINGS: Two view radiograph of the right elbow performed within an external immobilizer demonstrates interval reduction of the right elbow with the trochlea now positioned within the trochlear notch. There is, however, acute displaced fracture of the coronoid process noted. There is dorsal subluxation of the radiocapitellar articulation. IMPRESSION: Interval right elbow reduction. Persistent dorsal subluxation of the radiocapitellar articulation. Displaced fracture of the coronoid process. This would be better assessed with CT imaging. Electronically Signed   By: Helyn NumbersAshesh  Parikh M.D.   On: 01/11/2022 19:02  ? ?DG Elbow Complete Right ? ?Result Date: 01/11/2022 ?CLINICAL DATA:  Injury, fall on roller skates this afternoon. Right arm pain. EXAM: RIGHT ELBOW - COMPLETE 3+ VIEW COMPARISON:  None. FINDINGS: Posterior dislocation of the radius and ulna with respect to the distal humerus. There are 2 small fracture fragments adjacent to the distal humerus, donor site uncertain on the current exam, possibly arising from the olecranon or coronoid. IMPRESSION: Posterior dislocation of the radius and ulna with respect to the distal humerus. Two small fracture fragments adjacent to the distal humerus,  donor site uncertain, possibly arising from the olecranon or coronoid. Electronically Signed   By: Narda Rutherford M.D.   On: 01/11/2022 15:52  ? ?CT Elbow Right Wo Contrast ? ?Result Date: 01/11/2022 ?CLINICAL DATA:  Fall, right elbow pain EXAM: CT OF THE UPPER RIGHT EXTREMITY WITHOUT CONTRAST TECHNIQUE: Multidetector CT imaging of the upper right extremity was performed according to the standard protocol. RADIATION DOSE REDUCTION: This exam was performed according to the departmental dose-optimization program which includes automated exposure control, adjustment of the mA and/or kV according to patient size and/or use of iterative reconstruction technique. COMPARISON:  None.  FINDINGS: Bones/Joint/Cartilage Normal alignment. There are multiple acute, mildly displaced fracture fragments arising from the coronoid process of the proximal ulna measuring up to 9 mm displaced anteriorly. There is, additionally, a sliver like 4 mm intra-articular loose body within the trochlear notch. Ulnohumeral and radiocapitellar alignment is normal. Distal humerus and proximal radius appear intact. Joint spaces are preserved. Ligaments Suboptimally assessed by CT. Muscles and Tendons Normal muscle bulk.  Biceps and triceps tendons are intact. Soft tissues Subcutaneous soft tissue infiltration is seen superficial to the medial epicondyle. No discrete drainable subcutaneous fluid collection identified. Elbow effusion noted. IMPRESSION: Comminuted fracture of the coronoid process with multiple small fracture fragments displaced anteriorly as well as a 4 mm sliver like intra-articular loose body. Normal overall alignment. Electronically Signed   By: Helyn Numbers M.D.   On: 01/11/2022 20:17   ? ? ?PROCEDURES: ? ?Critical Care performed: No ? ?Procedures ? ?See separate procedure note ? ?MEDICATIONS ORDERED IN ED: ?Medications  ?propofol (DIPRIVAN) 10 mg/mL bolus/IV push 37.4 mg (37.4 mg Intravenous Given 01/11/22 1819)  ?propofol (DIPRIVAN) 10 mg/mL bolus/IV push 74.8 mg (37.4 mg Intravenous Given 01/11/22 1820)  ?sodium chloride 0.9 % bolus 1,000 mL (0 mLs Intravenous Stopped 01/11/22 2001)  ?propofol (DIPRIVAN) 10 mg/mL bolus/IV push (37.4 mg Intravenous Given 01/11/22 1824)  ?oxyCODONE-acetaminophen (PERCOCET/ROXICET) 5-325 MG per tablet 1 tablet (1 tablet Oral Given 01/11/22 2006)  ? ? ? ?IMPRESSION / MDM / ASSESSMENT AND PLAN / ED COURSE  ?I reviewed the triage vital signs and the nursing notes. ?             ?               ? ?Differential diagnosis includes, but is not limited to, elbow fracture, elbow dislocation, joint effusion ? ?The patient is on the cardiac monitor to evaluate for evidence of arrhythmia  and/or significant heart rate changes. ? ?----------------------------------------- ?5:16 PM on 01/11/2022 ?----------------------------------------- ?S/W Dr. Joice Lofts: he wants post-redux CT and posterior OCL. He will see the patient in the office next week.  ? ?Patient to the ED for evaluation of acute injury to the right elbow.  Patient presents after mechanical injury while rollerskating, with pain and disability noted.  Patient with no other reported injuries concurrently, is noted to have significant joint effusion and some deformity about the elbow.  Further evaluation with plain film does reveal a posterior elbow fracture with some fracture fragments, based on my review of images.  Patient has been informed of the diagnosis, and the need for close reduction.  Patient consents verbally to conscious procedural sedation.  Patient's diagnosis is consistent with posterior elbow dislocation with fracture fragments. Patient will be discharged home with prescriptions for hydrocodone. Patient is to follow up with orthopedics as needed or otherwise directed. Patient is given ED precautions to return to the ED for any worsening or new symptoms. ? ?  I reiterated to the patient at the time of discharge, that she was not safe to drive given that she had just had a conscious sedation procedure.  Patient's 68 year old son is at bedside, and does have a permit and is able to drive.  Patient reports she will allow the patient to drive slowly on the back roads to get home safely. ? ? ?FINAL CLINICAL IMPRESSION(S) / ED DIAGNOSES  ? ?Final diagnoses:  ?Closed fracture dislocation of right elbow, initial encounter  ? ? ? ?Rx / DC Orders  ? ?ED Discharge Orders   ? ? None  ? ?  ? ? ? ?Note:  This document was prepared using Dragon voice recognition software and may include unintentional dictation errors. ? ?  ?Lissa Hoard, PA-C ?01/11/22 2346 ? ?  ?Lissa Hoard, PA-C ?01/11/22 2347 ? ?  ?Georga Hacking,  MD ?01/12/22 1400 ? ?

## 2022-01-11 NOTE — ED Triage Notes (Signed)
Patient to ER via POV after falling on rollerskates this afternoon, complaints of right arm pain. No other injury. Pain at right elbow. ? ?No LOC/ hit head. No blood thinners.   ?

## 2022-01-12 ENCOUNTER — Telehealth: Payer: Self-pay | Admitting: Emergency Medicine

## 2022-01-12 MED ORDER — OXYCODONE-ACETAMINOPHEN 5-325 MG PO TABS: 1.0000 | ORAL_TABLET | ORAL | 0 refills | Status: AC | PRN

## 2022-01-12 NOTE — Telephone Encounter (Signed)
Patient seen by myself and PA yesterday with elbow fracture dislocation.  Calling today as did not receive prescription for opiate pain medicine.  We will call in Percocet to her pharmacy. ?

## 2022-02-17 ENCOUNTER — Encounter: Payer: Self-pay | Admitting: Nurse Practitioner

## 2022-02-17 ENCOUNTER — Ambulatory Visit: Payer: 59 | Admitting: Nurse Practitioner

## 2022-02-17 VITALS — BP 116/86 | HR 97 | Temp 98.5°F | Resp 16 | Ht 60.0 in | Wt 178.8 lb

## 2022-02-17 DIAGNOSIS — E559 Vitamin D deficiency, unspecified: Secondary | ICD-10-CM

## 2022-02-17 DIAGNOSIS — E782 Mixed hyperlipidemia: Secondary | ICD-10-CM

## 2022-02-17 DIAGNOSIS — M150 Primary generalized (osteo)arthritis: Secondary | ICD-10-CM | POA: Diagnosis not present

## 2022-02-17 DIAGNOSIS — D509 Iron deficiency anemia, unspecified: Secondary | ICD-10-CM

## 2022-02-17 DIAGNOSIS — R110 Nausea: Secondary | ICD-10-CM

## 2022-02-17 DIAGNOSIS — F5101 Primary insomnia: Secondary | ICD-10-CM

## 2022-02-17 DIAGNOSIS — F411 Generalized anxiety disorder: Secondary | ICD-10-CM

## 2022-02-17 MED ORDER — EZETIMIBE 10 MG PO TABS: 10.0000 mg | ORAL_TABLET | Freq: Every day | ORAL | 3 refills | Status: DC

## 2022-02-17 MED ORDER — VITAMIN D (ERGOCALCIFEROL) 1.25 MG (50000 UNIT) PO CAPS: 50000.0000 [IU] | ORAL_CAPSULE | ORAL | 1 refills | Status: DC

## 2022-02-17 MED ORDER — TRAMADOL HCL 50 MG PO TABS: 100.0000 mg | ORAL_TABLET | Freq: Two times a day (BID) | ORAL | 2 refills | Status: DC | PRN

## 2022-02-17 MED ORDER — GABAPENTIN 100 MG PO CAPS: 100.0000 mg | ORAL_CAPSULE | Freq: Two times a day (BID) | ORAL | 3 refills | Status: DC

## 2022-02-17 MED ORDER — FOLIC ACID 1 MG PO TABS: 1.0000 mg | ORAL_TABLET | Freq: Every day | ORAL | 1 refills | Status: DC

## 2022-02-17 MED ORDER — TRAZODONE HCL 100 MG PO TABS: 100.0000 mg | ORAL_TABLET | Freq: Every evening | ORAL | 3 refills | Status: DC | PRN

## 2022-02-17 MED ORDER — ONDANSETRON 4 MG PO TBDP: 4.0000 mg | ORAL_TABLET | Freq: Three times a day (TID) | ORAL | 1 refills | Status: DC | PRN

## 2022-02-17 MED ORDER — MELOXICAM 15 MG PO TABS: 15.0000 mg | ORAL_TABLET | Freq: Every day | ORAL | 3 refills | Status: DC

## 2022-02-17 NOTE — Progress Notes (Signed)
[facility] ?[address] ?[place], [place] [address] ? ?Internal MEDICINE  ?Office Visit Note ? ?Patient Name: Alexandria Murray ? [id number]  ?[id number] ? ?Date of Service: 02/17/2022 ? ?Chief Complaint  ?Patient presents with  ? Follow-up  ? Anxiety  ? Medication Refill  ? ? ?HPI ?Azalyn presents for a follow-up visit for anxiety, chronic arthritis pain, intermittent nausea, vitamin D deficiency and folate deficiency.  She is in need of several medication refills.  She quit her job at [employer] and is happy with her decision.  She also stopped taking Lexapro and reports that her anxiety level is low and she does not want to take that medication.  She recently tried lower skates again and states that it was a mistake and she fell and injured her right arm with 2 fractures and a dislocated elbow which is being treated by orthopedic in [place].  She is currently in an adjustable brace and states that there is a 1 floating piece of cartilage in her arm that her orthopedic surgeon is concerned about. ?Blood pressure is stable and within normal limits as well as her other vital signs.  She now has stable insurance and states that she will get her lab work done soon. ? ? ? ?Current Medication: ?Outpatient Encounter Medications as of 02/17/2022  ?Medication Sig  ? Aspirin-Acetaminophen-Caffeine (PAMPRIN MAX PO) Take 2 tablets by mouth daily.  ? docusate sodium (COLACE) 100 MG capsule Take 1 capsule (100 mg total) by mouth 2 (two) times daily.  ? ferrous sulfate 325 (65 FE) MG tablet Take 1 tablet (325 mg total) by mouth daily.  ? mirabegron ER (MYRBETRIQ) 50 MG TB24 tablet Take 1 tablet (50 mg total) by mouth daily.  ? Multiple Vitamin (MULTIVITAMIN WITH MINERALS) TABS tablet Take 1 tablet by mouth daily.  ? Multiple Vitamins-Minerals (MULTIVITAMIN PO) Take 1 tablet by mouth daily.  ? [DISCONTINUED] escitalopram (LEXAPRO) 20 MG tablet Take 1 tablet (20 mg total) by mouth daily.  ? [DISCONTINUED] ezetimibe  (ZETIA) 10 MG tablet Take 1 tablet (10 mg total) by mouth daily.  ? [DISCONTINUED] folic acid (FOLVITE) 1 MG tablet Take 1 tablet (1 mg total) by mouth daily.  ? [DISCONTINUED] gabapentin (NEURONTIN) 100 MG capsule Take 1 capsule (100 mg total) by mouth 2 (two) times daily.  ? [DISCONTINUED] meloxicam (MOBIC) 15 MG tablet Take 1 tablet (15 mg total) by mouth daily.  ? [DISCONTINUED] ondansetron (ZOFRAN-ODT) 4 MG disintegrating tablet Take 1 tablet (4 mg total) by mouth every 8 (eight) hours as needed for nausea or vomiting.  ? [DISCONTINUED] traMADol (ULTRAM) 50 MG tablet Take 2 tablets (100 mg total) by mouth every 12 (twelve) hours as needed for moderate pain.  ? [DISCONTINUED] traZODone (DESYREL) 100 MG tablet Take 1 tablet (100 mg total) by mouth at bedtime as needed for sleep.  ? [DISCONTINUED] Vitamin D, Ergocalciferol, (DRISDOL) 1.25 MG (50000 UNIT) CAPS capsule Take 1 capsule (50,000 Units total) by mouth every 7 (seven) days.  ? ezetimibe (ZETIA) 10 MG tablet Take 1 tablet (10 mg total) by mouth daily.  ? folic acid (FOLVITE) 1 MG tablet Take 1 tablet (1 mg total) by mouth daily.  ? gabapentin (NEURONTIN) 100 MG capsule Take 1 capsule (100 mg total) by mouth 2 (two) times daily.  ? meloxicam (MOBIC) 15 MG tablet Take 1 tablet (15 mg total) by mouth daily.  ? ondansetron (ZOFRAN-ODT) 4 MG disintegrating tablet Take 1 tablet (4 mg total) by mouth every 8 (  eight) hours as needed for nausea or vomiting.  ? traMADol (ULTRAM) 50 MG tablet Take 2 tablets (100 mg total) by mouth every 12 (twelve) hours as needed for moderate pain.  ? traZODone (DESYREL) 100 MG tablet Take 1 tablet (100 mg total) by mouth at bedtime as needed for sleep.  ? Vitamin D, Ergocalciferol, (DRISDOL) 1.25 MG (50000 UNIT) CAPS capsule Take 1 capsule (50,000 Units total) by mouth every 7 (seven) days.  ? ?No facility-administered encounter medications on file as of 02/17/2022.  ? ? ?Surgical History: ?Past Surgical History:  ?Procedure  Laterality Date  ? CHOLECYSTECTOMY    ? COLPOSCOPY  07/26/2015  ? FINGER SURGERY    ? ? ?Medical History: ?Past Medical History:  ?Diagnosis Date  ? Abnormal Pap smear of cervix 06/20/2015  ? Anxiety   ? Cervical high risk human papillomavirus (HPV) DNA test positive   ? Dysplasia of cervix, low grade (CIN 1) 07/26/2015  ? Guillain-Barre syndrome (HCC)   ? Nicotine dependence   ? Stroke [facility])   ? Torn rotator cuff   ? ? ?Family History: ?Family History  ?Problem Relation Age of Onset  ? Cancer Mother   ? Stroke Paternal Grandfather   ? ? ?Social History  ? ?Socioeconomic History  ? Marital status: Married  ?  Spouse name: Not on file  ? Number of children: Not on file  ? Years of education: Not on file  ? Highest education level: Not on file  ?Occupational History  ? Not on file  ?Tobacco Use  ? Smoking status: Every Day  ?  Packs/day: 1.00  ?  Types: Cigarettes  ? Smokeless tobacco: Never  ?Vaping Use  ? Vaping Use: Never used  ?Substance and Sexual Activity  ? Alcohol use: No  ? Drug use: No  ? Sexual activity: Yes  ?  Birth control/protection: Post-menopausal  ?Other Topics Concern  ? Not on file  ?Social History Narrative  ? Not on file  ? ?Social Determinants of Health  ? ?Financial Resource Strain: Not on file  ?Food Insecurity: Not on file  ?Transportation Needs: Not on file  ?Physical Activity: Not on file  ?Stress: Not on file  ?Social Connections: Not on file  ?Intimate Partner Violence: Not on file  ? ? ? ? ?Review of Systems  ?Constitutional:  Negative for chills, fatigue and unexpected weight change.  ?HENT:  Negative for congestion, rhinorrhea, sneezing and sore throat.   ?Eyes:  Negative for redness.  ?Respiratory: Negative.  Negative for cough, chest tightness, shortness of breath and wheezing.   ?Cardiovascular: Negative.  Negative for chest pain and palpitations.  ?Gastrointestinal:  Negative for abdominal pain, constipation, diarrhea, nausea and vomiting.  ?Genitourinary:  Negative for dysuria and  frequency.  ?Musculoskeletal:  Positive for arthralgias (takes meloxicam daily and tramadol BID prn). Negative for back pain, joint swelling and neck pain.  ?Neurological: Negative.   ?Psychiatric/Behavioral:  Positive for sleep disturbance (takes trazodone). Negative for behavioral problems (Depression) and suicidal ideas. The patient is nervous/anxious (improved, stopped lexapro).   ? ?Vital Signs: ?BP 116/86   Pulse 97   Temp 98.5 ?F (36.9 ?C)   Resp 16   Ht 5' (1.524 m)   Wt 178 lb 12.8 oz (81.1 kg)   LMP 02/02/2018 (Approximate)   SpO2 98%   BMI 34.92 kg/m?  ? ? ?Physical Exam ?Vitals reviewed.  ?Constitutional:   ?   General: She is not in acute distress. ?   Appearance: Normal appearance.  She is obese. She is not ill-appearing.  ?HENT:  ?   Head: Normocephalic and atraumatic.  ?Eyes:  ?   Pupils: Pupils are equal, round, and reactive to light.  ?Cardiovascular:  ?   Rate and Rhythm: Normal rate and regular rhythm.  ?Pulmonary:  ?   Effort: Pulmonary effort is normal. No respiratory distress.  ?Musculoskeletal:  ?   Comments: Wearing adjustable brace on right arm  ?Neurological:  ?   Mental Status: She is alert and oriented to person, place, and time.  ?Psychiatric:     ?   Mood and Affect: Mood normal.     ?   Behavior: Behavior normal.  ? ? ? ? ? ?Assessment/Plan: ?1. Primary generalized (osteo)arthritis ?Stable, refills ordered for medications to help alleviate pain related to osteoarthritis. ?- gabapentin (NEURONTIN) 100 MG capsule; Take 1 capsule (100 mg total) by mouth 2 (two) times daily.  Dispense: 180 capsule; Refill: 3 ?- traMADol (ULTRAM) 50 MG tablet; Take 2 tablets (100 mg total) by mouth every 12 (twelve) hours as needed for moderate pain.  Dispense: 120 tablet; Refill: 2 ?- meloxicam (MOBIC) 15 MG tablet; Take 1 tablet (15 mg total) by mouth daily.  Dispense: 90 tablet; Refill: 3 ? ?2. Mixed hyperlipidemia ?Patient is currently on ezetimibe.  Patient has no documentation on chart  regarding statin intolerance and no prior prescription for statin medications were found.  Will discuss at next office visit about starting a statin medication such as rosuvastatin. ?- ezetimibe (ZETIA) 10 MG tablet; Take 1

## 2022-05-19 ENCOUNTER — Ambulatory Visit (INDEPENDENT_AMBULATORY_CARE_PROVIDER_SITE_OTHER): Payer: 59 | Admitting: Nurse Practitioner

## 2022-05-19 ENCOUNTER — Encounter: Payer: Self-pay | Admitting: Nurse Practitioner

## 2022-05-19 VITALS — BP 119/88 | HR 110 | Temp 97.8°F | Resp 16 | Ht 60.0 in | Wt 179.0 lb

## 2022-05-19 DIAGNOSIS — L03012 Cellulitis of left finger: Secondary | ICD-10-CM | POA: Diagnosis not present

## 2022-05-19 DIAGNOSIS — D509 Iron deficiency anemia, unspecified: Secondary | ICD-10-CM

## 2022-05-19 DIAGNOSIS — M150 Primary generalized (osteo)arthritis: Secondary | ICD-10-CM | POA: Diagnosis not present

## 2022-05-19 DIAGNOSIS — L03019 Cellulitis of unspecified finger: Secondary | ICD-10-CM

## 2022-05-19 DIAGNOSIS — E559 Vitamin D deficiency, unspecified: Secondary | ICD-10-CM

## 2022-05-19 MED ORDER — TRAMADOL HCL 50 MG PO TABS: 100.0000 mg | ORAL_TABLET | Freq: Two times a day (BID) | ORAL | 2 refills | Status: DC | PRN

## 2022-05-19 MED ORDER — FOLIC ACID 1 MG PO TABS: 1.0000 mg | ORAL_TABLET | Freq: Every day | ORAL | 1 refills | Status: DC

## 2022-05-19 MED ORDER — VITAMIN D (ERGOCALCIFEROL) 1.25 MG (50000 UNIT) PO CAPS: 50000.0000 [IU] | ORAL_CAPSULE | ORAL | 1 refills | Status: DC

## 2022-05-19 MED ORDER — DOXYCYCLINE HYCLATE 100 MG PO TABS: 100.0000 mg | ORAL_TABLET | Freq: Two times a day (BID) | ORAL | 0 refills | Status: AC

## 2022-05-19 MED ORDER — MUPIROCIN 2 % EX OINT: 1.0000 | TOPICAL_OINTMENT | Freq: Two times a day (BID) | CUTANEOUS | 0 refills | Status: DC

## 2022-05-19 NOTE — Progress Notes (Signed)
[facility] [address] [place], [place] [address]  Internal MEDICINE  Office Visit Note  Patient Name: Alexandria Murray  [id number]  [id number]  Date of Service: 05/19/2022  Chief Complaint  Patient presents with   Follow-up   Anxiety   Other    Left middle finger infected   Quality Metric Gaps    colonoscopy     HPI Alexandria Murray presents for a follow-up visit for infection of left middle finger, chronic arthritis pain, intermittent nausea, vitamin D deficiency and folate deficiency.  She is in need of medication refills.  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 Her anxiety level is stable.  Her right arm is injured still from skating 3 months ago and she reports that she will ned surgery but cannot get surgery until her daughter is able to get her license in November. She is also due for colorectal cancer screening but reports that her insurance, [employer] is going out of business by august 31 this year.  She remains in an adjustable brace and states that there is a 1 floating piece of cartilage in her arm that her orthopedic surgeon is concerned about. Blood pressure is stable and within normal limits as well as her other vital signs.   She also has an infection in the cuticle of her left middle finger that she has drained multiple times but it will not resolve.    Current Medication: Outpatient Encounter Medications as of 05/19/2022  Medication Sig   Aspirin-Acetaminophen-Caffeine (PAMPRIN MAX PO) Take 2 tablets by mouth daily.   docusate sodium (COLACE) 100 MG capsule Take 1 capsule (100 mg total) by mouth 2 (two) times daily.   doxycycline (VIBRA-TABS) 100 MG tablet Take 1 tablet (100 mg total) by mouth 2 (two) times daily for 14 days. Take with food.   ezetimibe (ZETIA) 10 MG tablet Take 1 tablet (10 mg total) by mouth daily.   ferrous sulfate 325 (65 FE) MG tablet Take 1 tablet (325 mg total) by mouth daily.   gabapentin (NEURONTIN) 100 MG capsule Take 1 capsule (100 mg  total) by mouth 2 (two) times daily.   meloxicam (MOBIC) 15 MG tablet Take 1 tablet (15 mg total) by mouth daily.   mirabegron ER (MYRBETRIQ) 50 MG TB24 tablet Take 1 tablet (50 mg total) by mouth daily.   Multiple Vitamin (MULTIVITAMIN WITH MINERALS) TABS tablet Take 1 tablet by mouth daily.   Multiple Vitamins-Minerals (MULTIVITAMIN PO) Take 1 tablet by mouth daily.   mupirocin ointment (BACTROBAN) 2 % Apply 1 Application topically 2 (two) times daily. To affected area until healed   ondansetron (ZOFRAN-ODT) 4 MG disintegrating tablet Take 1 tablet (4 mg total) by mouth every 8 (eight) hours as needed for nausea or vomiting.   traZODone (DESYREL) 100 MG tablet Take 1 tablet (100 mg total) by mouth at bedtime as needed for sleep.   [DISCONTINUED] folic acid (FOLVITE) 1 MG tablet Take 1 tablet (1 mg total) by mouth daily.   [DISCONTINUED] traMADol (ULTRAM) 50 MG tablet Take 2 tablets (100 mg total) by mouth every 12 (twelve) hours as needed for moderate pain.   [DISCONTINUED] Vitamin D, Ergocalciferol, (DRISDOL) 1.25 MG (50000 UNIT) CAPS capsule Take 1 capsule (50,000 Units total) by mouth every 7 (seven) days.   folic acid (FOLVITE) 1 MG tablet Take 1 tablet (1 mg total) by mouth daily.   traMADol (ULTRAM) 50 MG tablet Take 2 tablets (100 mg total) by mouth every 12 (twelve) hours as needed  for moderate pain.   Vitamin D, Ergocalciferol, (DRISDOL) 1.25 MG (50000 UNIT) CAPS capsule Take 1 capsule (50,000 Units total) by mouth every 7 (seven) days.   No facility-administered encounter medications on file as of 05/19/2022.    Surgical History: Past Surgical History:  Procedure Laterality Date   CHOLECYSTECTOMY     COLPOSCOPY  07/26/2015   FINGER SURGERY      Medical History: Past Medical History:  Diagnosis Date   Abnormal Pap smear of cervix 06/20/2015   Anxiety    Cervical high risk human papillomavirus (HPV) DNA test positive    Dysplasia of cervix, low grade (CIN 1) 07/26/2015    Guillain-Barre syndrome (HCC)    Nicotine dependence    Stroke (HCC)    Torn rotator cuff     Family History: Family History  Problem Relation Age of Onset   Cancer Mother    Stroke Paternal Grandfather     Social History   Socioeconomic History   Marital status: Married    Spouse name: Not on file   Number of children: Not on file   Years of education: Not on file   Highest education level: Not on file  Occupational History   Not on file  Tobacco Use   Smoking status: Every Day    Packs/day: 1.00    Types: Cigarettes   Smokeless tobacco: Never  Vaping Use   Vaping Use: Never used  Substance and Sexual Activity   Alcohol use: No   Drug use: No   Sexual activity: Yes    Birth control/protection: Post-menopausal  Other Topics Concern   Not on file  Social History Narrative   Not on file   Social Determinants of Health   Financial Resource Strain: Not on file  Food Insecurity: Not on file  Transportation Needs: Not on file  Physical Activity: Not on file  Stress: Not on file  Social Connections: Not on file  Intimate Partner Violence: Not on file      Review of Systems  Constitutional:  Negative for chills, fatigue and unexpected weight change.  HENT:  Negative for congestion, rhinorrhea, sneezing and sore throat.   Eyes:  Negative for redness.  Respiratory: Negative.  Negative for cough, chest tightness, shortness of breath and wheezing.   Cardiovascular: Negative.  Negative for chest pain and palpitations.  Gastrointestinal:  Negative for abdominal pain, constipation, diarrhea, nausea and vomiting.  Genitourinary:  Negative for dysuria and frequency.  Musculoskeletal:  Positive for arthralgias (takes meloxicam daily and tramadol BID prn). Negative for back pain, joint swelling and neck pain.  Skin:  Positive for wound (left middle finger).  Neurological: Negative.   Psychiatric/Behavioral:  Positive for sleep disturbance (takes trazodone). Negative for  behavioral problems (Depression), self-injury and suicidal ideas. The patient is not nervous/anxious.     Vital Signs: BP 119/88   Pulse (!) 110   Temp 97.8 F (36.6 C)   Resp 16   Ht 5' (1.524 m)   Wt 179 lb (81.2 kg)   LMP 02/02/2018 (Approximate)   SpO2 99%   BMI 34.96 kg/m    Physical Exam Vitals reviewed.  Constitutional:      General: She is not in acute distress.    Appearance: Normal appearance. She is obese. She is not ill-appearing.  HENT:     Head: Normocephalic and atraumatic.  Eyes:     Pupils: Pupils are equal, round, and reactive to light.  Cardiovascular:     Rate and Rhythm: Normal  rate and regular rhythm.  Pulmonary:     Effort: Pulmonary effort is normal. No respiratory distress.  Musculoskeletal:     Comments: Wearing adjustable brace on right arm  Skin:    Comments: Left middle finger is swollen and red, with small amount of purulent drainage on the side of the nail at the cuticle.   Neurological:     Mental Status: She is alert and oriented to person, place, and time.  Psychiatric:        Mood and Affect: Mood normal.        Behavior: Behavior normal.        Assessment/Plan: 1. Cellulitis of middle finger, left Empiric antibiotic prescribed as well as topical mupirocin, patient instructed to complete the 2 weeks of doxy and call the clinic if no improvement or worsening of infection.  - doxycycline (VIBRA-TABS) 100 MG tablet; Take 1 tablet (100 mg total) by mouth 2 (two) times daily for 14 days. Take with food.  Dispense: 28 tablet; Refill: 0 - mupirocin ointment (BACTROBAN) 2 %; Apply 1 Application topically 2 (two) times daily. To affected area until healed  Dispense: 22 g; Refill: 0  2. Paronychia of middle finger See problem #1 - doxycycline (VIBRA-TABS) 100 MG tablet; Take 1 tablet (100 mg total) by mouth 2 (two) times daily for 14 days. Take with food.  Dispense: 28 tablet; Refill: 0 - mupirocin ointment (BACTROBAN) 2 %; Apply 1  Application topically 2 (two) times daily. To affected area until healed  Dispense: 22 g; Refill: 0  3. Primary generalized (osteo)arthritis Stable, refills ordered - traMADol (ULTRAM) 50 MG tablet; Take 2 tablets (100 mg total) by mouth every 12 (twelve) hours as needed for moderate pain.  Dispense: 120 tablet; Refill: 2  4. Iron deficiency anemia, unspecified iron deficiency anemia type Refill ordered - folic acid (FOLVITE) 1 MG tablet; Take 1 tablet (1 mg total) by mouth daily.  Dispense: 90 tablet; Refill: 1  5. Vitamin D deficiency Refills ordered - Vitamin D, Ergocalciferol, (DRISDOL) 1.25 MG (50000 UNIT) CAPS capsule; Take 1 capsule (50,000 Units total) by mouth every 7 (seven) days.  Dispense: 12 capsule; Refill: 1   General Counseling: Bushra verbalizes understanding of the findings of todays visit and agrees with plan of treatment. I have discussed any further diagnostic evaluation that may be needed or ordered today. We also reviewed her medications today. she has been encouraged to call the office with any questions or concerns that should arise related to todays visit.    No orders of the defined types were placed in this encounter.   Meds ordered this encounter  Medications   doxycycline (VIBRA-TABS) 100 MG tablet    Sig: Take 1 tablet (100 mg total) by mouth 2 (two) times daily for 14 days. Take with food.    Dispense:  28 tablet    Refill:  0   mupirocin ointment (BACTROBAN) 2 %    Sig: Apply 1 Application topically 2 (two) times daily. To affected area until healed    Dispense:  22 g    Refill:  0   traMADol (ULTRAM) 50 MG tablet    Sig: Take 2 tablets (100 mg total) by mouth every 12 (twelve) hours as needed for moderate pain.    Dispense:  120 tablet    Refill:  2   folic acid (FOLVITE) 1 MG tablet    Sig: Take 1 tablet (1 mg total) by mouth daily.    Dispense:  90  tablet    Refill:  1   Vitamin D, Ergocalciferol, (DRISDOL) 1.25 MG (50000 UNIT) CAPS capsule     Sig: Take 1 capsule (50,000 Units total) by mouth every 7 (seven) days.    Dispense:  12 capsule    Refill:  1    Return in about 3 months (around 08/19/2022) for F/U, Josephine Rudnick PCP, pain med refill.   Total time spent:30 Minutes Time spent includes review of chart, medications, test results, and follow up plan with the patient.   [place] Controlled Substance Database was reviewed by me.  This patient was seen by Jonetta Osgood, FNP-C in collaboration with Dr. Clayborn Bigness as a part of collaborative care agreement.   Darneshia Demary R. Valetta Fuller, MSN, FNP-C Internal medicine

## 2022-08-18 ENCOUNTER — Ambulatory Visit (INDEPENDENT_AMBULATORY_CARE_PROVIDER_SITE_OTHER): Payer: BLUE CROSS/BLUE SHIELD | Admitting: Nurse Practitioner

## 2022-08-18 ENCOUNTER — Encounter: Payer: Self-pay | Admitting: Nurse Practitioner

## 2022-08-18 ENCOUNTER — Telehealth: Payer: Self-pay | Admitting: Nurse Practitioner

## 2022-08-18 VITALS — BP 140/75 | HR 100 | Temp 98.3°F | Resp 16 | Ht 60.0 in | Wt 189.0 lb

## 2022-08-18 DIAGNOSIS — F17210 Nicotine dependence, cigarettes, uncomplicated: Secondary | ICD-10-CM | POA: Diagnosis not present

## 2022-08-18 DIAGNOSIS — Z760 Encounter for issue of repeat prescription: Secondary | ICD-10-CM

## 2022-08-18 DIAGNOSIS — L03019 Cellulitis of unspecified finger: Secondary | ICD-10-CM | POA: Diagnosis not present

## 2022-08-18 DIAGNOSIS — B351 Tinea unguium: Secondary | ICD-10-CM | POA: Diagnosis not present

## 2022-08-18 MED ORDER — NICOTINE POLACRILEX 4 MG MT LOZG
4.0000 mg | LOZENGE | OROMUCOSAL | 2 refills | Status: DC | PRN
Start: 2022-08-18 — End: 2022-11-13

## 2022-08-18 MED ORDER — ONDANSETRON 4 MG PO TBDP: 4.0000 mg | ORAL_TABLET | Freq: Three times a day (TID) | ORAL | 2 refills | Status: DC | PRN

## 2022-08-18 MED ORDER — TRAMADOL HCL 50 MG PO TABS: 100.0000 mg | ORAL_TABLET | Freq: Two times a day (BID) | ORAL | 2 refills | Status: DC | PRN

## 2022-08-18 NOTE — Progress Notes (Signed)
[facility] [place], [place] [address]  Internal MEDICINE  Office Visit Note  Patient Name: AXEL MEAS  [id number]  [id number]  Date of Service: 08/18/2022  Chief Complaint  Patient presents with   Follow-up    Follow up med refill    HPI Bailea presents for a follow up visit for chronic pain, hypertension and medication refills.  BP elevated today, improved when rechecked.  Chronic pain, due for refills, current dose effective.  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 Does not want to take wellbutrin or chantix. Cannot use nicotine gum due to dentures and does not want the patches. Interested in lozenges.  Still plans to eventually have surgery on her right arm and is still wearing her brace for support.  Nail of third finger left hand looks like there is build up under the nail and possible fungal nail infection along with paronychia.     Current Medication: Outpatient Encounter Medications as of 08/18/2022  Medication Sig   Aspirin-Acetaminophen-Caffeine (PAMPRIN MAX PO) Take 2 tablets by mouth daily.   docusate sodium (COLACE) 100 MG capsule Take 1 capsule (100 mg total) by mouth 2 (two) times daily.   ezetimibe (ZETIA) 10 MG tablet Take 1 tablet (10 mg total) by mouth daily.   ferrous sulfate 325 (65 FE) MG tablet Take 1 tablet (325 mg total) by mouth daily.   folic acid (FOLVITE) 1 MG tablet Take 1 tablet (1 mg total) by mouth daily.   gabapentin (NEURONTIN) 100 MG capsule Take 1 capsule (100 mg total) by mouth 2 (two) times daily.   meloxicam (MOBIC) 15 MG tablet Take 1 tablet (15 mg total) by mouth daily.   mirabegron ER (MYRBETRIQ) 50 MG TB24 tablet Take 1 tablet (50 mg total) by mouth daily.   Multiple Vitamin (MULTIVITAMIN WITH MINERALS) TABS tablet Take 1 tablet by mouth daily.   Multiple Vitamins-Minerals  (MULTIVITAMIN PO) Take 1 tablet by mouth daily.   mupirocin ointment (BACTROBAN) 2 % Apply 1 Application topically 2 (two) times daily. To affected area until healed   nicotine polacrilex (COMMIT) 4 MG lozenge Take 1 lozenge (4 mg total) by mouth as needed for smoking cessation.   traZODone (DESYREL) 100 MG tablet Take 1 tablet (100 mg total) by mouth at bedtime as needed for sleep.   Vitamin D, Ergocalciferol, (DRISDOL) 1.25 MG (50000 UNIT) CAPS capsule Take 1 capsule (50,000 Units total) by mouth every 7 (seven) days.   [DISCONTINUED] ondansetron (ZOFRAN-ODT) 4 MG disintegrating tablet Take 1 tablet (4 mg total) by mouth every 8 (eight) hours as needed for nausea or vomiting.   [DISCONTINUED] traMADol (ULTRAM) 50 MG tablet Take 2 tablets (100 mg total) by mouth every 12 (twelve) hours as needed for moderate pain.   ondansetron (ZOFRAN-ODT) 4 MG disintegrating tablet Take 1 tablet (4 mg total) by mouth every 8 (eight) hours as needed for nausea or vomiting.   traMADol (ULTRAM) 50 MG tablet Take 2 tablets (100 mg total) by mouth every 12 (twelve) hours as needed for moderate pain.   No facility-administered encounter medications on file as of 08/18/2022.    Surgical History: Past Surgical History:  Procedure Laterality Date   CHOLECYSTECTOMY     COLPOSCOPY  07/26/2015   FINGER SURGERY      Medical History: Past Medical History:  Diagnosis Date   Abnormal  Pap smear of cervix 06/20/2015   Anxiety    Cervical high risk human papillomavirus (HPV) DNA test positive    Dysplasia of cervix, low grade (CIN 1) 07/26/2015   Guillain-Barre syndrome (HCC)    Nicotine dependence    Stroke (HCC)    Torn rotator cuff     Family History: Family History  Problem Relation Age of Onset   Cancer Mother    Stroke Paternal Grandfather     Social History   Socioeconomic History   Marital status: Married    Spouse name: Not on file   Number of children: Not on file   Years of education: Not on  file   Highest education level: Not on file  Occupational History   Not on file  Tobacco Use   Smoking status: Every Day    Packs/day: 1.00    Types: Cigarettes   Smokeless tobacco: Never  Vaping Use   Vaping Use: Never used  Substance and Sexual Activity   Alcohol use: No   Drug use: No   Sexual activity: Yes    Birth control/protection: Post-menopausal  Other Topics Concern   Not on file  Social History Narrative   Not on file   Social Determinants of Health   Financial Resource Strain: Not on file  Food Insecurity: Not on file  Transportation Needs: Not on file  Physical Activity: Not on file  Stress: Not on file  Social Connections: Not on file  Intimate Partner Violence: Not on file      Review of Systems  Constitutional:  Negative for chills, fatigue and unexpected weight change.  HENT:  Negative for congestion, rhinorrhea, sneezing and sore throat.   Respiratory:  Positive for chest tightness and wheezing. Negative for cough and shortness of breath.   Cardiovascular: Negative.  Negative for chest pain and palpitations.  Gastrointestinal:  Negative for abdominal pain, constipation, diarrhea, nausea and vomiting.  Musculoskeletal:  Positive for arthralgias (takes meloxicam daily and tramadol BID prn). Negative for back pain, joint swelling and neck pain.  Neurological: Negative.   Psychiatric/Behavioral:  Positive for sleep disturbance (takes trazodone). Negative for behavioral problems (Depression) and suicidal ideas. The patient is nervous/anxious (improved, stopped lexapro).     Vital Signs: BP (!) 140/75 Comment: 184/110  Pulse 100   Temp 98.3 F (36.8 C)   Resp 16   Ht 5' (1.524 m)   Wt 189 lb (85.7 kg)   LMP 02/02/2018 (Approximate)   SpO2 96%   BMI 36.91 kg/m    Physical Exam Vitals reviewed.  Constitutional:      General: She is not in acute distress.    Appearance: Normal appearance. She is obese. She is not ill-appearing.  HENT:      Head: Normocephalic and atraumatic.  Eyes:     Pupils: Pupils are equal, round, and reactive to light.  Cardiovascular:     Rate and Rhythm: Normal rate and regular rhythm.  Pulmonary:     Effort: Pulmonary effort is normal. No respiratory distress.  Musculoskeletal:     Comments: Wearing adjustable brace on right arm  Neurological:     Mental Status: She is alert and oriented to person, place, and time.  Psychiatric:        Mood and Affect: Mood normal.        Behavior: Behavior normal.       Assessment/Plan: 1. Onychomycosis of nail of digit of hand Referred to dermatology for further evaluation - Ambulatory referral to Dermatology  2. Paronychia of middle finger Referred to dermatology for further evaluation - Ambulatory referral to Dermatology  3. Cigarette smoker motivated to quit Working on smoking cessation, opted for lozenge - nicotine polacrilex (COMMIT) 4 MG lozenge; Take 1 lozenge (4 mg total) by mouth as needed for smoking cessation.  Dispense: 108 tablet; Refill: 2  4. Medication refill - ondansetron (ZOFRAN-ODT) 4 MG disintegrating tablet; Take 1 tablet (4 mg total) by mouth every 8 (eight) hours as needed for nausea or vomiting.  Dispense: 30 tablet; Refill: 2 - traMADol (ULTRAM) 50 MG tablet; Take 2 tablets (100 mg total) by mouth every 12 (twelve) hours as needed for moderate pain.  Dispense: 120 tablet; Refill: 2   General Counseling: Almas verbalizes understanding of the findings of todays visit and agrees with plan of treatment. I have discussed any further diagnostic evaluation that may be needed or ordered today. We also reviewed her medications today. she has been encouraged to call the office with any questions or concerns that should arise related to todays visit.    Orders Placed This Encounter  Procedures   Ambulatory referral to Dermatology    Meds ordered this encounter  Medications   nicotine polacrilex (COMMIT) 4 MG lozenge    Sig: Take 1  lozenge (4 mg total) by mouth as needed for smoking cessation.    Dispense:  108 tablet    Refill:  2    Please run through insurance first, if not covered please show patient where to find them OTC.   ondansetron (ZOFRAN-ODT) 4 MG disintegrating tablet    Sig: Take 1 tablet (4 mg total) by mouth every 8 (eight) hours as needed for nausea or vomiting.    Dispense:  30 tablet    Refill:  2    Please fill today   traMADol (ULTRAM) 50 MG tablet    Sig: Take 2 tablets (100 mg total) by mouth every 12 (twelve) hours as needed for moderate pain.    Dispense:  120 tablet    Refill:  2    Return in about 3 months (around 11/18/2022) for F/U, med refill, Georg Ang PCP.   Total time spent:30 Minutes Time spent includes review of chart, medications, test results, and follow up plan with the patient.   [place] Controlled Substance Database was reviewed by me.  This patient was seen by Sallyanne Kuster, FNP-C in collaboration with Dr. Beverely Risen as a part of collaborative care agreement.   Jorma Tassinari R. Tedd Sias, MSN, FNP-C Internal medicine

## 2022-08-18 NOTE — Telephone Encounter (Signed)
Urgent Dermatology referral faxed to [facility]; [phone]

## 2022-11-06 ENCOUNTER — Encounter: Payer: BC Managed Care – PPO | Admitting: Nurse Practitioner

## 2022-11-13 ENCOUNTER — Encounter: Payer: Self-pay | Admitting: Nurse Practitioner

## 2022-11-13 ENCOUNTER — Ambulatory Visit: Payer: BC Managed Care – PPO | Admitting: Nurse Practitioner

## 2022-11-13 VITALS — BP 140/82 | HR 83 | Temp 97.3°F | Resp 16 | Ht 60.0 in | Wt 177.0 lb

## 2022-11-13 DIAGNOSIS — Z79899 Other long term (current) drug therapy: Secondary | ICD-10-CM | POA: Diagnosis not present

## 2022-11-13 DIAGNOSIS — F17210 Nicotine dependence, cigarettes, uncomplicated: Secondary | ICD-10-CM

## 2022-11-13 DIAGNOSIS — M150 Primary generalized (osteo)arthritis: Secondary | ICD-10-CM | POA: Diagnosis not present

## 2022-11-13 DIAGNOSIS — I10 Essential (primary) hypertension: Secondary | ICD-10-CM | POA: Diagnosis not present

## 2022-11-13 MED ORDER — MELOXICAM 15 MG PO TABS: 15.0000 mg | ORAL_TABLET | Freq: Every day | ORAL | 3 refills | Status: DC

## 2022-11-13 MED ORDER — EZETIMIBE 10 MG PO TABS: 10.0000 mg | ORAL_TABLET | Freq: Every day | ORAL | 3 refills | Status: DC

## 2022-11-13 MED ORDER — GABAPENTIN 100 MG PO CAPS: 100.0000 mg | ORAL_CAPSULE | Freq: Two times a day (BID) | ORAL | 3 refills | Status: DC

## 2022-11-13 MED ORDER — FOLIC ACID 1 MG PO TABS: 1.0000 mg | ORAL_TABLET | Freq: Every day | ORAL | 1 refills | Status: DC

## 2022-11-13 MED ORDER — TRAMADOL HCL 50 MG PO TABS: 100.0000 mg | ORAL_TABLET | Freq: Two times a day (BID) | ORAL | 2 refills | Status: DC | PRN

## 2022-11-13 MED ORDER — VARENICLINE TARTRATE (STARTER) 0.5 MG X 11 & 1 MG X 42 PO TBPK: ORAL_TABLET | ORAL | 0 refills | Status: DC

## 2022-11-13 MED ORDER — ONDANSETRON 4 MG PO TBDP: 4.0000 mg | ORAL_TABLET | Freq: Three times a day (TID) | ORAL | 2 refills | Status: DC | PRN

## 2022-11-13 NOTE — Progress Notes (Signed)
[facility] [place], [place] [address]  Internal MEDICINE  Office Visit Note  Patient Name: Alexandria Murray  [id number]  [id number]  Date of Service: 11/13/2022  Chief Complaint  Patient presents with   Follow-up    HPI Alexandria Murray presents for a follow-up visit for smoking cessation, chronic pain, med refills.  Want to quit smoking -- lozenges made her sick, wants to try chantix  Chronic pain -- needs tramadol refills. [place] twice but is ok, knee gave out.  Needs other meds refills as well.  Hypertension -- was elevated, rechecked and improved.     Current Medication: Outpatient Encounter Medications as of 11/13/2022  Medication Sig   Aspirin-Acetaminophen-Caffeine (PAMPRIN MAX PO) Take 2 tablets by mouth daily.   docusate sodium (COLACE) 100 MG capsule Take 1 capsule (100 mg total) by mouth 2 (two) times daily.   ferrous sulfate 325 (65 FE) MG tablet Take 1 tablet (325 mg total) by mouth daily.   mirabegron ER (MYRBETRIQ) 50 MG TB24 tablet Take 1 tablet (50 mg total) by mouth daily.   Multiple Vitamin (MULTIVITAMIN WITH MINERALS) TABS tablet Take 1 tablet by mouth daily.   Multiple Vitamins-Minerals (MULTIVITAMIN PO) Take 1 tablet by mouth daily.   mupirocin ointment (BACTROBAN) 2 % Apply 1 Application topically 2 (two) times daily. To affected area until healed   traZODone (DESYREL) 100 MG tablet Take 1 tablet (100 mg total) by mouth at bedtime as needed for sleep.   Varenicline Tartrate, Starter, 0.5 MG X 11 & 1 MG X 42 TBPK Take 0.5 mg by mouth once daily on days 1-3, then take 0.5 mg twice daily on days 4-7, then increase to 1 mg twice daily   Vitamin D, Ergocalciferol, (DRISDOL) 1.25 MG (50000 UNIT) CAPS capsule Take 1 capsule (50,000 Units total) by mouth every 7 (seven) days.   [DISCONTINUED] ezetimibe (ZETIA) 10 MG tablet Take 1 tablet (10 mg total) by mouth daily.   [DISCONTINUED] folic acid (FOLVITE) 1 MG tablet Take 1 tablet (1 mg total) by mouth daily.    [DISCONTINUED] gabapentin (NEURONTIN) 100 MG capsule Take 1 capsule (100 mg total) by mouth 2 (two) times daily.   [DISCONTINUED] meloxicam (MOBIC) 15 MG tablet Take 1 tablet (15 mg total) by mouth daily.   [DISCONTINUED] nicotine polacrilex (COMMIT) 4 MG lozenge Take 1 lozenge (4 mg total) by mouth as needed for smoking cessation.   [DISCONTINUED] ondansetron (ZOFRAN-ODT) 4 MG disintegrating tablet Take 1 tablet (4 mg total) by mouth every 8 (eight) hours as needed for nausea or vomiting.   [DISCONTINUED] traMADol (ULTRAM) 50 MG tablet Take 2 tablets (100 mg total) by mouth every 12 (twelve) hours as needed for moderate pain.   ezetimibe (ZETIA) 10 MG tablet Take 1 tablet (10 mg total) by mouth daily.   folic acid (FOLVITE) 1 MG tablet Take 1 tablet (1 mg total) by mouth daily.   gabapentin (NEURONTIN) 100 MG capsule Take 1 capsule (100 mg total) by mouth 2 (two) times daily.   meloxicam (MOBIC) 15 MG tablet Take 1 tablet (15 mg total) by mouth daily.   ondansetron (ZOFRAN-ODT) 4 MG disintegrating tablet Take 1 tablet (4 mg total) by mouth every 8 (eight) hours as needed for nausea or vomiting.   traMADol (ULTRAM) 50 MG tablet Take 2 tablets (100 mg total) by mouth every 12 (twelve) hours as needed for moderate pain.   No facility-administered encounter medications on file as of 11/13/2022.    Surgical History:  Past Surgical History:  Procedure Laterality Date   CHOLECYSTECTOMY     COLPOSCOPY  07/26/2015   FINGER SURGERY      Medical History: Past Medical History:  Diagnosis Date   Abnormal Pap smear of cervix 06/20/2015   Anxiety    Cervical high risk human papillomavirus (HPV) DNA test positive    Dysplasia of cervix, low grade (CIN 1) 07/26/2015   Guillain-Barre syndrome (HCC)    Nicotine dependence    Stroke ([place])    Torn rotator cuff     Family History: Family History  Problem Relation Age of Onset   Cancer Mother    Stroke Paternal Grandfather     Social History    Socioeconomic History   Marital status: Married    Spouse name: Not on file   Number of children: Not on file   Years of education: Not on file   Highest education level: Not on file  Occupational History   Not on file  Tobacco Use   Smoking status: Every Day    Packs/day: 1.00    Types: Cigarettes   Smokeless tobacco: Never  Vaping Use   Vaping Use: Never used  Substance and Sexual Activity   Alcohol use: No   Drug use: No   Sexual activity: Yes    Birth control/protection: Post-menopausal  Other Topics Concern   Not on file  Social History Narrative   Not on file   Social Determinants of Health   Financial Resource Strain: Not on file  Food Insecurity: Not on file  Transportation Needs: Not on file  Physical Activity: Not on file  Stress: Not on file  Social Connections: Not on file  Intimate Partner Violence: Not on file      Review of Systems  Constitutional:  Negative for chills, fatigue and unexpected weight change.  HENT:  Negative for congestion, rhinorrhea, sneezing and sore throat.   Respiratory:  Positive for chest tightness and wheezing. Negative for cough and shortness of breath.   Cardiovascular: Negative.  Negative for chest pain and palpitations.  Gastrointestinal:  Negative for abdominal pain, constipation, diarrhea, nausea and vomiting.  Musculoskeletal:  Positive for arthralgias (takes meloxicam daily and tramadol BID prn). Negative for back pain, joint swelling and neck pain.  Neurological: Negative.   Psychiatric/Behavioral:  Positive for sleep disturbance (takes trazodone). Negative for behavioral problems (Depression) and suicidal ideas. The patient is nervous/anxious (improved, stopped lexapro).     Vital Signs: BP (!) 140/82 Comment: 181/103  Pulse 83   Temp (!) 97.3 F (36.3 C)   Resp 16   Ht 5' (1.524 m)   Wt 177 lb (80.3 kg)   LMP 02/02/2018 (Approximate)   SpO2 97%   BMI 34.57 kg/m    Physical Exam Vitals reviewed.   Constitutional:      General: She is not in acute distress.    Appearance: Normal appearance. She is obese. She is not ill-appearing.  HENT:     Head: Normocephalic and atraumatic.  Eyes:     Pupils: Pupils are equal, round, and reactive to light.  Cardiovascular:     Rate and Rhythm: Normal rate and regular rhythm.  Pulmonary:     Effort: Pulmonary effort is normal. No respiratory distress.  Musculoskeletal:     Comments: Wearing adjustable brace on right arm  Neurological:     Mental Status: She is alert and oriented to person, place, and time.  Psychiatric:        Mood and Affect:  Mood normal.        Behavior: Behavior normal.        Assessment/Plan: 1. Primary generalized (osteo)arthritis Stable, continue meloxicam as prescribed and tramadol as needed. Refills ordered follow up in 3 months for additional refills of tramadol - traMADol (ULTRAM) 50 MG tablet; Take 2 tablets (100 mg total) by mouth every 12 (twelve) hours as needed for moderate pain.  Dispense: 120 tablet; Refill: 2 - meloxicam (MOBIC) 15 MG tablet; Take 1 tablet (15 mg total) by mouth daily.  Dispense: 90 tablet; Refill: 3  2. Essential hypertension Elevated BP but improved when rechecked.   3. Encounter for medication review Medication list reviewed with patient, refills ordered - ondansetron (ZOFRAN-ODT) 4 MG disintegrating tablet; Take 1 tablet (4 mg total) by mouth every 8 (eight) hours as needed for nausea or vomiting.  Dispense: 30 tablet; Refill: 2 - gabapentin (NEURONTIN) 100 MG capsule; Take 1 capsule (100 mg total) by mouth 2 (two) times daily.  Dispense: 180 capsule; Refill: 3 - folic acid (FOLVITE) 1 MG tablet; Take 1 tablet (1 mg total) by mouth daily.  Dispense: 90 tablet; Refill: 1 - ezetimibe (ZETIA) 10 MG tablet; Take 1 tablet (10 mg total) by mouth daily.  Dispense: 90 tablet; Refill: 3  4. Cigarette smoker motivated to quit Will start chantix starter pack, if a refill is needed after  the first fill patient will call clinic for the continuing pack to be ordered.  - Varenicline Tartrate, Starter, 0.5 MG X 11 & 1 MG X 42 TBPK; Take 0.5 mg by mouth once daily on days 1-3, then take 0.5 mg twice daily on days 4-7, then increase to 1 mg twice daily  Dispense: 53 each; Refill: 0   General Counseling: Rakisha verbalizes understanding of the findings of todays visit and agrees with plan of treatment. I have discussed any further diagnostic evaluation that may be needed or ordered today. We also reviewed her medications today. she has been encouraged to call the office with any questions or concerns that should arise related to todays visit.    No orders of the defined types were placed in this encounter.   Meds ordered this encounter  Medications   Varenicline Tartrate, Starter, 0.5 MG X 11 & 1 MG X 42 TBPK    Sig: Take 0.5 mg by mouth once daily on days 1-3, then take 0.5 mg twice daily on days 4-7, then increase to 1 mg twice daily    Dispense:  53 each    Refill:  0   traMADol (ULTRAM) 50 MG tablet    Sig: Take 2 tablets (100 mg total) by mouth every 12 (twelve) hours as needed for moderate pain.    Dispense:  120 tablet    Refill:  2   ondansetron (ZOFRAN-ODT) 4 MG disintegrating tablet    Sig: Take 1 tablet (4 mg total) by mouth every 8 (eight) hours as needed for nausea or vomiting.    Dispense:  30 tablet    Refill:  2    Please fill today   meloxicam (MOBIC) 15 MG tablet    Sig: Take 1 tablet (15 mg total) by mouth daily.    Dispense:  90 tablet    Refill:  3   gabapentin (NEURONTIN) 100 MG capsule    Sig: Take 1 capsule (100 mg total) by mouth 2 (two) times daily.    Dispense:  180 capsule    Refill:  3   folic acid (FOLVITE)  1 MG tablet    Sig: Take 1 tablet (1 mg total) by mouth daily.    Dispense:  90 tablet    Refill:  1   ezetimibe (ZETIA) 10 MG tablet    Sig: Take 1 tablet (10 mg total) by mouth daily.    Dispense:  90 tablet    Refill:  3    High  cholesterol and history of stroke. Statins have caused excess joint and muscle pain.    Return in about 3 months (around 02/04/2023) for needs to reschedule CPE appt on 1/29 and also need f/u in 3 months for med refill .   Total time spent:30 Minutes Time spent includes review of chart, medications, test results, and follow up plan with the patient.   [place] Controlled Substance Database was reviewed by me.  This patient was seen by Jonetta Osgood, FNP-C in collaboration with Dr. Clayborn Bigness as a part of collaborative care agreement.   Dawn Convery R. Valetta Fuller, MSN, FNP-C Internal medicine

## 2022-11-24 ENCOUNTER — Encounter: Payer: BC Managed Care – PPO | Admitting: Nurse Practitioner

## 2023-01-22 ENCOUNTER — Other Ambulatory Visit: Payer: Self-pay | Admitting: Nurse Practitioner

## 2023-01-22 DIAGNOSIS — E559 Vitamin D deficiency, unspecified: Secondary | ICD-10-CM

## 2023-01-30 ENCOUNTER — Telehealth: Payer: Self-pay

## 2023-01-30 NOTE — Telephone Encounter (Signed)
PA was approved for traMADol.

## 2023-01-30 NOTE — Telephone Encounter (Signed)
PA was done for traMADol.

## 2023-02-11 ENCOUNTER — Other Ambulatory Visit: Payer: Self-pay | Admitting: Nurse Practitioner

## 2023-02-11 DIAGNOSIS — F5101 Primary insomnia: Secondary | ICD-10-CM

## 2023-02-16 ENCOUNTER — Encounter: Payer: Self-pay | Admitting: Nurse Practitioner

## 2023-02-16 ENCOUNTER — Ambulatory Visit (INDEPENDENT_AMBULATORY_CARE_PROVIDER_SITE_OTHER): Payer: BC Managed Care – PPO | Admitting: Nurse Practitioner

## 2023-02-16 VITALS — BP 138/70 | HR 77 | Temp 97.8°F | Resp 16 | Ht 60.0 in | Wt 162.6 lb

## 2023-02-16 DIAGNOSIS — E559 Vitamin D deficiency, unspecified: Secondary | ICD-10-CM | POA: Diagnosis not present

## 2023-02-16 DIAGNOSIS — Z1211 Encounter for screening for malignant neoplasm of colon: Secondary | ICD-10-CM

## 2023-02-16 DIAGNOSIS — D509 Iron deficiency anemia, unspecified: Secondary | ICD-10-CM

## 2023-02-16 DIAGNOSIS — E538 Deficiency of other specified B group vitamins: Secondary | ICD-10-CM

## 2023-02-16 DIAGNOSIS — E782 Mixed hyperlipidemia: Secondary | ICD-10-CM

## 2023-02-16 DIAGNOSIS — R300 Dysuria: Secondary | ICD-10-CM

## 2023-02-16 DIAGNOSIS — Z0001 Encounter for general adult medical examination with abnormal findings: Secondary | ICD-10-CM

## 2023-02-16 DIAGNOSIS — Z1231 Encounter for screening mammogram for malignant neoplasm of breast: Secondary | ICD-10-CM

## 2023-02-16 DIAGNOSIS — Z1212 Encounter for screening for malignant neoplasm of rectum: Secondary | ICD-10-CM

## 2023-02-16 MED ORDER — ONDANSETRON 4 MG PO TBDP
4.0000 mg | ORAL_TABLET | Freq: Three times a day (TID) | ORAL | 2 refills | Status: DC | PRN
Start: 2023-02-16 — End: 2023-05-12

## 2023-02-16 MED ORDER — TRAMADOL HCL 50 MG PO TABS
100.0000 mg | ORAL_TABLET | Freq: Two times a day (BID) | ORAL | 2 refills | Status: DC | PRN
Start: 2023-02-16 — End: 2023-05-12

## 2023-02-16 NOTE — Progress Notes (Signed)
[facility] [address] [place], [place] [address]  Internal MEDICINE  Office Visit Note  Patient Name: Alexandria Murray  [id number]  [id number]  Date of Service: 02/16/2023  Chief Complaint  Patient presents with   Annual Exam   Anxiety    HPI Alexandria Murray presents for an annual well visit and physical exam.  Well-appearing 47 y.o. female with hypertension, osteoarthritis, high cholesterol, insomnia, history of anemia,anxiety, and history of stroke and guillain-barre syndrome Routine CRC screening: opt for cologuard  Routine mammogram: due now  Pap smear: due in 2025 Labs: overdue for routine labs  New or worsening pain: none  Lost 15 more lbs since last office visit, wears a pedometer on her shoe and gets a lot of steps at work.  Loves her job, working on a promotion   Current Medication: Outpatient Encounter Medications as of 02/16/2023  Medication Sig   Aspirin-Acetaminophen-Caffeine (PAMPRIN MAX PO) Take 2 tablets by mouth daily.   docusate sodium (COLACE) 100 MG capsule Take 1 capsule (100 mg total) by mouth 2 (two) times daily.   ezetimibe (ZETIA) 10 MG tablet Take 1 tablet (10 mg total) by mouth daily.   ferrous sulfate 325 (65 FE) MG tablet Take 1 tablet (325 mg total) by mouth daily.   folic acid (FOLVITE) 1 MG tablet Take 1 tablet (1 mg total) by mouth daily.   gabapentin (NEURONTIN) 100 MG capsule Take 1 capsule (100 mg total) by mouth 2 (two) times daily.   meloxicam (MOBIC) 15 MG tablet Take 1 tablet (15 mg total) by mouth daily.   mirabegron ER (MYRBETRIQ) 50 MG TB24 tablet Take 1 tablet (50 mg total) by mouth daily.   Multiple Vitamin (MULTIVITAMIN WITH MINERALS) TABS tablet Take 1 tablet by mouth daily.   Multiple Vitamins-Minerals (MULTIVITAMIN PO) Take 1 tablet by mouth daily.   mupirocin ointment (BACTROBAN) 2 % Apply 1 Application topically 2 (two) times daily. To affected area until healed   traZODone (DESYREL) 100 MG tablet TAKE 1 TABLET BY MOUTH AT  BEDTIME AS NEEDED FOR SLEEP   Varenicline Tartrate, Starter, 0.5 MG X 11 & 1 MG X 42 TBPK Take 0.5 mg by mouth once daily on days 1-3, then take 0.5 mg twice daily on days 4-7, then increase to 1 mg twice daily   Vitamin D, Ergocalciferol, (DRISDOL) 1.25 MG (50000 UNIT) CAPS capsule Take 1 capsule by mouth once a week   [DISCONTINUED] ondansetron (ZOFRAN-ODT) 4 MG disintegrating tablet Take 1 tablet (4 mg total) by mouth every 8 (eight) hours as needed for nausea or vomiting.   [DISCONTINUED] traMADol (ULTRAM) 50 MG tablet Take 2 tablets (100 mg total) by mouth every 12 (twelve) hours as needed for moderate pain.   ondansetron (ZOFRAN-ODT) 4 MG disintegrating tablet Take 1 tablet (4 mg total) by mouth every 8 (eight) hours as needed for nausea or vomiting.   traMADol (ULTRAM) 50 MG tablet Take 2 tablets (100 mg total) by mouth every 12 (twelve) hours as needed for moderate pain.   No facility-administered encounter medications on file as of 02/16/2023.    Surgical History: Past Surgical History:  Procedure Laterality Date   CHOLECYSTECTOMY     COLPOSCOPY  07/26/2015   FINGER SURGERY      Medical History: Past Medical History:  Diagnosis Date   Abnormal Pap smear of cervix 06/20/2015   Anxiety    Cervical high risk human papillomavirus (HPV) DNA test positive    Dysplasia of cervix, low grade (CIN  1) 07/26/2015   Guillain-Barre syndrome    Nicotine dependence    Stroke    Torn rotator cuff     Family History: Family History  Problem Relation Age of Onset   Cancer Mother    Stroke Paternal Grandfather     Social History   Socioeconomic History   Marital status: Married    Spouse name: Not on file   Number of children: Not on file   Years of education: Not on file   Highest education level: Not on file  Occupational History   Not on file  Tobacco Use   Smoking status: Every Day    Packs/day: 1    Types: Cigarettes   Smokeless tobacco: Never  Vaping Use   Vaping Use:  Never used  Substance and Sexual Activity   Alcohol use: No   Drug use: No   Sexual activity: Yes    Birth control/protection: Post-menopausal  Other Topics Concern   Not on file  Social History Narrative   Not on file   Social Determinants of Health   Financial Resource Strain: Not on file  Food Insecurity: Not on file  Transportation Needs: Not on file  Physical Activity: Not on file  Stress: Not on file  Social Connections: Not on file  Intimate Partner Violence: Not on file      Review of Systems  Constitutional:  Negative for activity change, appetite change, chills, fatigue, fever and unexpected weight change.  HENT: Negative.  Negative for congestion, ear pain, rhinorrhea, sore throat and trouble swallowing.   Eyes: Negative.   Respiratory: Negative.  Negative for cough, chest tightness, shortness of breath and wheezing.   Cardiovascular: Negative.  Negative for chest pain and palpitations.  Gastrointestinal:  Positive for nausea. Negative for abdominal pain, blood in stool, constipation, diarrhea and vomiting.  Endocrine: Negative.   Genitourinary: Negative.  Negative for difficulty urinating, dysuria, frequency, hematuria and urgency.  Musculoskeletal:  Positive for arthralgias. Negative for back pain, joint swelling, myalgias and neck pain.  Skin: Negative.  Negative for rash and wound.  Allergic/Immunologic: Negative.  Negative for immunocompromised state.  Neurological: Negative.  Negative for dizziness, seizures, numbness and headaches.  Hematological: Negative.   Psychiatric/Behavioral:  Negative for behavioral problems, self-injury, sleep disturbance and suicidal ideas. The patient is nervous/anxious.     Vital Signs: BP 138/70 Comment: 159/96  Pulse 77   Temp 97.8 F (36.6 C)   Resp 16   Ht 5' (1.524 m)   Wt 162 lb 9.6 oz (73.8 kg)   LMP 02/02/2018 (Approximate)   SpO2 99%   BMI 31.76 kg/m    Physical Exam Vitals reviewed.  Constitutional:       General: She is awake. She is not in acute distress.    Appearance: Normal appearance. She is well-developed and well-groomed. She is obese. She is not ill-appearing or diaphoretic.  HENT:     Head: Normocephalic and atraumatic.     Right Ear: Tympanic membrane, ear canal and external ear normal.     Left Ear: Tympanic membrane, ear canal and external ear normal.     Nose: Nose normal. No congestion or rhinorrhea.     Mouth/Throat:     Lips: Pink.     Mouth: Mucous membranes are moist.     Dentition: Abnormal dentition. Has dentures.     Pharynx: Oropharynx is clear. Uvula midline. No oropharyngeal exudate or posterior oropharyngeal erythema.  Eyes:     General: Lids are normal. Vision  grossly intact. Gaze aligned appropriately. No scleral icterus.       Right eye: No discharge.        Left eye: No discharge.     Extraocular Movements: Extraocular movements intact.     Conjunctiva/sclera: Conjunctivae normal.     Pupils: Pupils are equal, round, and reactive to light.     Funduscopic exam:    Right eye: Red reflex present.        Left eye: Red reflex present. Neck:     Thyroid: No thyromegaly.     Vascular: No carotid bruit or JVD.     Trachea: No tracheal deviation.  Cardiovascular:     Rate and Rhythm: Normal rate and regular rhythm.     Pulses: Normal pulses.     Heart sounds: Normal heart sounds, S1 normal and S2 normal. No murmur heard.    No friction rub. No gallop.  Pulmonary:     Effort: Pulmonary effort is normal. No accessory muscle usage or respiratory distress.     Breath sounds: Normal breath sounds and air entry. No stridor. No wheezing or rales.  Chest:     Chest wall: No tenderness.     Comments: Mammogram ordered Abdominal:     General: Bowel sounds are normal. There is no distension.     Palpations: Abdomen is soft. There is no shifting dullness, fluid wave, mass or pulsatile mass.     Tenderness: There is no abdominal tenderness. There is no guarding or  rebound.  Musculoskeletal:        General: No tenderness or deformity. Normal range of motion.     Cervical back: Normal range of motion and neck supple.     Right lower leg: No edema.     Left lower leg: No edema.  Lymphadenopathy:     Cervical: No cervical adenopathy.  Skin:    General: Skin is warm and dry.     Capillary Refill: Capillary refill takes less than 2 seconds.     Coloration: Skin is not pale.     Findings: No erythema or rash.  Neurological:     Mental Status: She is alert and oriented to person, place, and time.     Cranial Nerves: No cranial nerve deficit.     Motor: No abnormal muscle tone.     Coordination: Coordination normal.     Gait: Gait normal.     Deep Tendon Reflexes: Reflexes are normal and symmetric.  Psychiatric:        Mood and Affect: Mood and affect normal.        Behavior: Behavior normal. Behavior is cooperative.        Thought Content: Thought content normal.        Judgment: Judgment normal.        Assessment/Plan: 1. Encounter for routine adult health examination with abnormal findings Age-appropriate preventive screenings and vaccinations discussed, annual physical exam completed. Routine labs for health maintenance ordered, see below. Medication refills ordered. PHM updated.  - CBC with Differential/Platelet - CMP14+EGFR - TSH + free T4 - ondansetron (ZOFRAN-ODT) 4 MG disintegrating tablet; Take 1 tablet (4 mg total) by mouth every 8 (eight) hours as needed for nausea or vomiting.  Dispense: 30 tablet; Refill: 2 - traMADol (ULTRAM) 50 MG tablet; Take 2 tablets (100 mg total) by mouth every 12 (twelve) hours as needed for moderate pain.  Dispense: 120 tablet; Refill: 2  2. Mixed hyperlipidemia Routine labs ordered - CBC with Differential/Platelet - CMP14+EGFR -  TSH + free T4 - Lipid Profile  3. Iron deficiency anemia, unspecified iron deficiency anemia type Routine labs ordered - CBC with Differential/Platelet - CMP14+EGFR -  TSH + free T4 - Iron, TIBC and Ferritin Panel - B12 and Folate Panel  4. B12 deficiency Routine labs ordered - CBC with Differential/Platelet - CMP14+EGFR - TSH + free T4 - Iron, TIBC and Ferritin Panel - B12 and Folate Panel  5. Vitamin D deficiency Routine labs ordered - CBC with Differential/Platelet - CMP14+EGFR - Vitamin D (25 hydroxy) - TSH + free T4  6. Dysuria Routine urinalysis done - UA/M w/rflx Culture, Routine  7. Screening for colorectal cancer Cologuard test ordered - Cologuard  8. Encounter for screening mammogram for malignant neoplasm of breast Routine mammogram ordered - MM 3D SCREENING MAMMOGRAM BILATERAL BREAST; Future      General Counseling: Siddalee verbalizes understanding of the findings of todays visit and agrees with plan of treatment. I have discussed any further diagnostic evaluation that may be needed or ordered today. We also reviewed her medications today. she has been encouraged to call the office with any questions or concerns that should arise related to todays visit.    Orders Placed This Encounter  Procedures   MM 3D SCREENING MAMMOGRAM BILATERAL BREAST   UA/M w/rflx Culture, Routine   Cologuard   CBC with Differential/Platelet   CMP14+EGFR   Vitamin D (25 hydroxy)   TSH + free T4   Lipid Profile   Iron, TIBC and Ferritin Panel   B12 and Folate Panel    Meds ordered this encounter  Medications   ondansetron (ZOFRAN-ODT) 4 MG disintegrating tablet    Sig: Take 1 tablet (4 mg total) by mouth every 8 (eight) hours as needed for nausea or vomiting.    Dispense:  30 tablet    Refill:  2    Please fill today   traMADol (ULTRAM) 50 MG tablet    Sig: Take 2 tablets (100 mg total) by mouth every 12 (twelve) hours as needed for moderate pain.    Dispense:  120 tablet    Refill:  2    Return in about 3 months (around 05/12/2023) for F/U, pain med refill, Prerana Strayer PCP.   Total time spent:30 Minutes Time spent includes review of  chart, medications, test results, and follow up plan with the patient.   [place] Controlled Substance Database was reviewed by me.  This patient was seen by Sallyanne Kuster, FNP-C in collaboration with Dr. Beverely Risen as a part of collaborative care agreement.  Cael Worth R. Tedd Sias, MSN, FNP-C Internal medicine

## 2023-02-17 LAB — MICROSCOPIC EXAMINATION: Casts: NONE SEEN /lpf

## 2023-02-17 LAB — UA/M W/RFLX CULTURE, ROUTINE
Bilirubin, UA: NEGATIVE
Glucose, UA: NEGATIVE
Ketones, UA: NEGATIVE
Leukocytes,UA: NEGATIVE
Nitrite, UA: NEGATIVE
Specific Gravity, UA: 1.021 (ref 1.005–1.030)
Urobilinogen, Ur: 0.2 mg/dL (ref 0.2–1.0)
pH, UA: 5.5 (ref 5.0–7.5)

## 2023-04-13 ENCOUNTER — Other Ambulatory Visit: Payer: Self-pay | Admitting: Nurse Practitioner

## 2023-04-13 DIAGNOSIS — E559 Vitamin D deficiency, unspecified: Secondary | ICD-10-CM

## 2023-05-11 ENCOUNTER — Other Ambulatory Visit: Payer: Self-pay | Admitting: Nurse Practitioner

## 2023-05-11 DIAGNOSIS — F5101 Primary insomnia: Secondary | ICD-10-CM

## 2023-05-12 ENCOUNTER — Ambulatory Visit: Payer: BC Managed Care – PPO | Admitting: Nurse Practitioner

## 2023-05-12 ENCOUNTER — Encounter: Payer: Self-pay | Admitting: Nurse Practitioner

## 2023-05-12 VITALS — BP 139/85 | HR 78 | Temp 97.1°F | Resp 16 | Ht 60.0 in | Wt 157.6 lb

## 2023-05-12 DIAGNOSIS — F17210 Nicotine dependence, cigarettes, uncomplicated: Secondary | ICD-10-CM

## 2023-05-12 DIAGNOSIS — M150 Primary generalized (osteo)arthritis: Secondary | ICD-10-CM | POA: Diagnosis not present

## 2023-05-12 DIAGNOSIS — Z79899 Other long term (current) drug therapy: Secondary | ICD-10-CM

## 2023-05-12 DIAGNOSIS — I10 Essential (primary) hypertension: Secondary | ICD-10-CM | POA: Diagnosis not present

## 2023-05-12 DIAGNOSIS — Z1211 Encounter for screening for malignant neoplasm of colon: Secondary | ICD-10-CM

## 2023-05-12 DIAGNOSIS — Z1212 Encounter for screening for malignant neoplasm of rectum: Secondary | ICD-10-CM

## 2023-05-12 MED ORDER — ONDANSETRON 4 MG PO TBDP
4.0000 mg | ORAL_TABLET | Freq: Three times a day (TID) | ORAL | 2 refills | Status: DC | PRN
Start: 2023-05-12 — End: 2023-08-31

## 2023-05-12 MED ORDER — TRAMADOL HCL 50 MG PO TABS
100.0000 mg | ORAL_TABLET | Freq: Two times a day (BID) | ORAL | 2 refills | Status: DC | PRN
Start: 2023-05-12 — End: 2023-08-05

## 2023-05-12 MED ORDER — FOLIC ACID 1 MG PO TABS
1.0000 mg | ORAL_TABLET | Freq: Every day | ORAL | 1 refills | Status: DC
Start: 2023-05-12 — End: 2023-11-04

## 2023-05-12 NOTE — Progress Notes (Signed)
[facility] [address] [place], [place] [address]  Internal MEDICINE  Office Visit Note  Patient Name: Alexandria Murray  [id number]  [id number]  Date of Service: 05/12/2023  Chief Complaint  Patient presents with   Follow-up    HPI Alexandria Murray presents for a follow-up visit for hypertension, chronic pain, cologuard and refills.  Hypertension --elevated but improved when rechecked, she has not taken her medication yet this morning.  Osteoarthritis -- takes tramadol as needed for chronic arthritic pain, refills due.  CRC screening -- needs new order for cologuard order, kit at home expired.  Labs blood work -- lost her requisition form, needs new print out.     Current Medication: Outpatient Encounter Medications as of 05/12/2023  Medication Sig   Aspirin-Acetaminophen-Caffeine (PAMPRIN MAX PO) Take 2 tablets by mouth daily.   docusate sodium (COLACE) 100 MG capsule Take 1 capsule (100 mg total) by mouth 2 (two) times daily.   ezetimibe (ZETIA) 10 MG tablet Take 1 tablet (10 mg total) by mouth daily.   ferrous sulfate 325 (65 FE) MG tablet Take 1 tablet (325 mg total) by mouth daily.   gabapentin (NEURONTIN) 100 MG capsule Take 1 capsule (100 mg total) by mouth 2 (two) times daily.   meloxicam (MOBIC) 15 MG tablet Take 1 tablet (15 mg total) by mouth daily.   Multiple Vitamin (MULTIVITAMIN WITH MINERALS) TABS tablet Take 1 tablet by mouth daily.   Multiple Vitamins-Minerals (MULTIVITAMIN PO) Take 1 tablet by mouth daily.   traZODone (DESYREL) 100 MG tablet TAKE 1 TABLET BY MOUTH AT BEDTIME AS NEEDED FOR SLEEP   Varenicline Tartrate, Starter, 0.5 MG X 11 & 1 MG X 42 TBPK Take 0.5 mg by mouth once daily on days 1-3, then take 0.5 mg twice daily on days 4-7, then increase to 1 mg twice daily   Vitamin D, Ergocalciferol, (DRISDOL) 1.25 MG (50000 UNIT) CAPS capsule Take 1 capsule by mouth once a week   [DISCONTINUED] folic acid (FOLVITE) 1 MG tablet Take 1 tablet (1 mg total) by  mouth daily.   [DISCONTINUED] mirabegron ER (MYRBETRIQ) 50 MG TB24 tablet Take 1 tablet (50 mg total) by mouth daily.   [DISCONTINUED] mupirocin ointment (BACTROBAN) 2 % Apply 1 Application topically 2 (two) times daily. To affected area until healed   [DISCONTINUED] ondansetron (ZOFRAN-ODT) 4 MG disintegrating tablet Take 1 tablet (4 mg total) by mouth every 8 (eight) hours as needed for nausea or vomiting.   [DISCONTINUED] traMADol (ULTRAM) 50 MG tablet Take 2 tablets (100 mg total) by mouth every 12 (twelve) hours as needed for moderate pain.   folic acid (FOLVITE) 1 MG tablet Take 1 tablet (1 mg total) by mouth daily.   ondansetron (ZOFRAN-ODT) 4 MG disintegrating tablet Take 1 tablet (4 mg total) by mouth every 8 (eight) hours as needed for nausea or vomiting.   traMADol (ULTRAM) 50 MG tablet Take 2 tablets (100 mg total) by mouth every 12 (twelve) hours as needed for moderate pain.   No facility-administered encounter medications on file as of 05/12/2023.    Surgical History: Past Surgical History:  Procedure Laterality Date   CHOLECYSTECTOMY     COLPOSCOPY  07/26/2015   FINGER SURGERY      Medical History: Past Medical History:  Diagnosis Date   Abnormal Pap smear of cervix 06/20/2015   Anxiety    Cervical high risk human papillomavirus (HPV) DNA test positive    Dysplasia of cervix, low grade (CIN 1) 07/26/2015  Guillain-Barre syndrome (HCC)    Nicotine dependence    Stroke [facility])    Torn rotator cuff     Family History: Family History  Problem Relation Age of Onset   Cancer Mother    Stroke Paternal Grandfather     Social History   Socioeconomic History   Marital status: Married    Spouse name: Not on file   Number of children: Not on file   Years of education: Not on file   Highest education level: Not on file  Occupational History   Not on file  Tobacco Use   Smoking status: Every Day    Current packs/day: 1.00    Types: Cigarettes   Smokeless tobacco:  Never  Vaping Use   Vaping status: Never Used  Substance and Sexual Activity   Alcohol use: No   Drug use: No   Sexual activity: Yes    Birth control/protection: Post-menopausal  Other Topics Concern   Not on file  Social History Narrative   Not on file   Social Determinants of Health   Financial Resource Strain: Not on file  Food Insecurity: Not on file  Transportation Needs: Not on file  Physical Activity: Not on file  Stress: Not on file  Social Connections: Not on file  Intimate Partner Violence: Not on file      Review of Systems  Constitutional:  Negative for chills, fatigue and unexpected weight change.  HENT:  Negative for congestion, rhinorrhea, sneezing and sore throat.   Respiratory:  Positive for chest tightness and wheezing. Negative for cough and shortness of breath.   Cardiovascular: Negative.  Negative for chest pain and palpitations.  Gastrointestinal:  Negative for abdominal pain, constipation, diarrhea, nausea and vomiting.  Musculoskeletal:  Positive for arthralgias (takes meloxicam daily and tramadol BID prn). Negative for back pain, joint swelling and neck pain.  Neurological: Negative.   Psychiatric/Behavioral:  Positive for sleep disturbance (takes trazodone). Negative for behavioral problems (Depression) and suicidal ideas. The patient is nervous/anxious (improved, stopped lexapro).     Vital Signs: BP 139/85 Comment: 170/110 -- has not taken her medication yet today.  Pulse 78   Temp (!) 97.1 F (36.2 C)   Resp 16   Ht 5' (1.524 m)   Wt 157 lb 9.6 oz (71.5 kg)   LMP 02/02/2018 (Approximate)   SpO2 97%   BMI 30.78 kg/m    Physical Exam Vitals reviewed.  Constitutional:      General: She is not in acute distress.    Appearance: Normal appearance. She is obese. She is not ill-appearing.  HENT:     Head: Normocephalic and atraumatic.  Eyes:     Pupils: Pupils are equal, round, and reactive to light.  Cardiovascular:     Rate and  Rhythm: Normal rate and regular rhythm.  Pulmonary:     Effort: Pulmonary effort is normal. No respiratory distress.  Musculoskeletal:     Comments: Wearing adjustable brace on right arm  Neurological:     Mental Status: She is alert and oriented to person, place, and time.  Psychiatric:        Mood and Affect: Mood normal.        Behavior: Behavior normal.        Assessment/Plan: 1. Essential hypertension Stable, continue medications as prescribed.   2. Primary generalized (osteo)arthritis Continue prn tramadol as prescribed. Refills ordered. Follow up in 3 months for additional refills.  - traMADol (ULTRAM) 50 MG tablet; Take 2 tablets (100  mg total) by mouth every 12 (twelve) hours as needed for moderate pain.  Dispense: 120 tablet; Refill: 2  3. Cigarette smoker motivated to quit Chantix previously ordered, patient plans to check with manufacturer for copay coupon or patient assistance.   4. Encounter for medication review Medication list reviewed, updated and refills ordered.  - ondansetron (ZOFRAN-ODT) 4 MG disintegrating tablet; Take 1 tablet (4 mg total) by mouth every 8 (eight) hours as needed for nausea or vomiting.  Dispense: 30 tablet; Refill: 2 - folic acid (FOLVITE) 1 MG tablet; Take 1 tablet (1 mg total) by mouth daily.  Dispense: 90 tablet; Refill: 1  5. Screening for colorectal cancer Cologuard test reordered.  - Cologuard   General Counseling: Shree verbalizes understanding of the findings of todays visit and agrees with plan of treatment. I have discussed any further diagnostic evaluation that may be needed or ordered today. We also reviewed her medications today. she has been encouraged to call the office with any questions or concerns that should arise related to todays visit.    Orders Placed This Encounter  Procedures   Cologuard    Meds ordered this encounter  Medications   traMADol (ULTRAM) 50 MG tablet    Sig: Take 2 tablets (100 mg total) by  mouth every 12 (twelve) hours as needed for moderate pain.    Dispense:  120 tablet    Refill:  2   ondansetron (ZOFRAN-ODT) 4 MG disintegrating tablet    Sig: Take 1 tablet (4 mg total) by mouth every 8 (eight) hours as needed for nausea or vomiting.    Dispense:  30 tablet    Refill:  2    Please fill today   folic acid (FOLVITE) 1 MG tablet    Sig: Take 1 tablet (1 mg total) by mouth daily.    Dispense:  90 tablet    Refill:  1    Return in about 3 months (around 08/05/2023) for F/U, pain med refill, Precilla Purnell PCP.   Total time spent:30 Minutes Time spent includes review of chart, medications, test results, and follow up plan with the patient.   [place] Controlled Substance Database was reviewed by me.  This patient was seen by Sallyanne Kuster, FNP-C in collaboration with Dr. Beverely Risen as a part of collaborative care agreement.   Jearldean Gutt R. Tedd Sias, MSN, FNP-C Internal medicine

## 2023-07-05 ENCOUNTER — Other Ambulatory Visit: Payer: Self-pay | Admitting: Nurse Practitioner

## 2023-07-05 DIAGNOSIS — E559 Vitamin D deficiency, unspecified: Secondary | ICD-10-CM

## 2023-08-05 ENCOUNTER — Ambulatory Visit: Payer: BC Managed Care – PPO | Admitting: Nurse Practitioner

## 2023-08-05 ENCOUNTER — Encounter: Payer: Self-pay | Admitting: Nurse Practitioner

## 2023-08-05 VITALS — BP 120/84 | HR 87 | Temp 98.4°F | Resp 16 | Ht 60.0 in | Wt 157.0 lb

## 2023-08-05 DIAGNOSIS — F17210 Nicotine dependence, cigarettes, uncomplicated: Secondary | ICD-10-CM

## 2023-08-05 DIAGNOSIS — M150 Primary generalized (osteo)arthritis: Secondary | ICD-10-CM

## 2023-08-05 DIAGNOSIS — E782 Mixed hyperlipidemia: Secondary | ICD-10-CM | POA: Diagnosis not present

## 2023-08-05 DIAGNOSIS — I10 Essential (primary) hypertension: Secondary | ICD-10-CM | POA: Diagnosis not present

## 2023-08-05 DIAGNOSIS — L989 Disorder of the skin and subcutaneous tissue, unspecified: Secondary | ICD-10-CM | POA: Diagnosis not present

## 2023-08-05 DIAGNOSIS — E538 Deficiency of other specified B group vitamins: Secondary | ICD-10-CM | POA: Diagnosis not present

## 2023-08-05 DIAGNOSIS — E559 Vitamin D deficiency, unspecified: Secondary | ICD-10-CM | POA: Diagnosis not present

## 2023-08-05 DIAGNOSIS — Z0001 Encounter for general adult medical examination with abnormal findings: Secondary | ICD-10-CM | POA: Diagnosis not present

## 2023-08-05 DIAGNOSIS — D509 Iron deficiency anemia, unspecified: Secondary | ICD-10-CM | POA: Diagnosis not present

## 2023-08-05 MED ORDER — TRAMADOL HCL 50 MG PO TABS
100.0000 mg | ORAL_TABLET | Freq: Two times a day (BID) | ORAL | 2 refills | Status: DC | PRN
Start: 2023-08-05 — End: 2023-11-04

## 2023-08-05 NOTE — Progress Notes (Signed)
[facility] [address] [place], [place] [address]  Internal MEDICINE  Office Visit Note  Patient Name: Alexandria Murray  [id number]  [id number]  Date of Service: 08/05/2023  Chief Complaint  Patient presents with   Follow-up   Quality Metric Gaps    Colonoscopy     HPI Alexandria Murray presents for a follow-up visit for OA, skin lesion, hypertension and smoking  Chronic osteoarthritis -- takes tramadol, due for refills.  Skin lesion -- Has a lesion at the top of upper lip that has been there for years and will never completely heal and comes back multiple times. Concern for skin cancer or other unknown abnormality Hypertension -- controlled with medication  Smoker -- waiting until first of the year to see if insurance will cover chantix     Current Medication: Outpatient Encounter Medications as of 08/05/2023  Medication Sig   Aspirin-Acetaminophen-Caffeine (PAMPRIN MAX PO) Take 2 tablets by mouth daily.   docusate sodium (COLACE) 100 MG capsule Take 1 capsule (100 mg total) by mouth 2 (two) times daily.   ezetimibe (ZETIA) 10 MG tablet Take 1 tablet (10 mg total) by mouth daily.   ferrous sulfate 325 (65 FE) MG tablet Take 1 tablet (325 mg total) by mouth daily.   folic acid (FOLVITE) 1 MG tablet Take 1 tablet (1 mg total) by mouth daily.   gabapentin (NEURONTIN) 100 MG capsule Take 1 capsule (100 mg total) by mouth 2 (two) times daily.   meloxicam (MOBIC) 15 MG tablet Take 1 tablet (15 mg total) by mouth daily.   Multiple Vitamin (MULTIVITAMIN WITH MINERALS) TABS tablet Take 1 tablet by mouth daily.   Multiple Vitamins-Minerals (MULTIVITAMIN PO) Take 1 tablet by mouth daily.   ondansetron (ZOFRAN-ODT) 4 MG disintegrating tablet Take 1 tablet (4 mg total) by mouth every 8 (eight) hours as needed for nausea or vomiting.   traZODone (DESYREL) 100 MG tablet TAKE 1 TABLET BY MOUTH AT BEDTIME AS NEEDED FOR SLEEP   Vitamin D, Ergocalciferol, (DRISDOL) 1.25 MG (50000 UNIT) CAPS  capsule Take 1 capsule by mouth once a week   [DISCONTINUED] traMADol (ULTRAM) 50 MG tablet Take 2 tablets (100 mg total) by mouth every 12 (twelve) hours as needed for moderate pain.   [DISCONTINUED] Varenicline Tartrate, Starter, 0.5 MG X 11 & 1 MG X 42 TBPK Take 0.5 mg by mouth once daily on days 1-3, then take 0.5 mg twice daily on days 4-7, then increase to 1 mg twice daily   traMADol (ULTRAM) 50 MG tablet Take 2 tablets (100 mg total) by mouth every 12 (twelve) hours as needed for moderate pain.   No facility-administered encounter medications on file as of 08/05/2023.    Surgical History: Past Surgical History:  Procedure Laterality Date   CHOLECYSTECTOMY     COLPOSCOPY  07/26/2015   FINGER SURGERY      Medical History: Past Medical History:  Diagnosis Date   Abnormal Pap smear of cervix 06/20/2015   Anxiety    Cervical high risk human papillomavirus (HPV) DNA test positive    Dysplasia of cervix, low grade (CIN 1) 07/26/2015   Guillain-Barre syndrome (HCC)    Nicotine dependence    Stroke (HCC)    Torn rotator cuff     Family History: Family History  Problem Relation Age of Onset   Cancer Mother    Stroke Paternal Grandfather     Social History   Socioeconomic History   Marital status: Married    Spouse  name: Not on file   Number of children: Not on file   Years of education: Not on file   Highest education level: Not on file  Occupational History   Not on file  Tobacco Use   Smoking status: Every Day    Current packs/day: 1.00    Types: Cigarettes   Smokeless tobacco: Never  Vaping Use   Vaping status: Never Used  Substance and Sexual Activity   Alcohol use: No   Drug use: No   Sexual activity: Yes    Birth control/protection: Post-menopausal  Other Topics Concern   Not on file  Social History Narrative   Not on file   Social Determinants of Health   Financial Resource Strain: Not on file  Food Insecurity: Not on file  Transportation Needs:  Not on file  Physical Activity: Not on file  Stress: Not on file  Social Connections: Not on file  Intimate Partner Violence: Not on file      Review of Systems  Constitutional:  Negative for chills, fatigue and unexpected weight change.  HENT:  Negative for congestion, rhinorrhea, sneezing and sore throat.   Respiratory:  Positive for chest tightness and wheezing. Negative for cough and shortness of breath.   Cardiovascular: Negative.  Negative for chest pain and palpitations.  Gastrointestinal:  Negative for abdominal pain, constipation, diarrhea, nausea and vomiting.  Musculoskeletal:  Positive for arthralgias (takes meloxicam daily and tramadol BID prn). Negative for back pain, joint swelling and neck pain.  Neurological: Negative.   Psychiatric/Behavioral:  Positive for sleep disturbance (takes trazodone). Negative for behavioral problems (Depression) and suicidal ideas. The patient is nervous/anxious (improved, stopped lexapro).     Vital Signs: BP 120/84   Pulse 87   Temp 98.4 F (36.9 C)   Resp 16   Ht 5' (1.524 m)   Wt 157 lb (71.2 kg)   LMP 02/02/2018 (Approximate)   SpO2 95%   BMI 30.66 kg/m    Physical Exam Vitals reviewed.  Constitutional:      General: She is not in acute distress.    Appearance: Normal appearance. She is obese. She is not ill-appearing.  HENT:     Head: Normocephalic and atraumatic.  Eyes:     Pupils: Pupils are equal, round, and reactive to light.  Cardiovascular:     Rate and Rhythm: Normal rate and regular rhythm.  Pulmonary:     Effort: Pulmonary effort is normal. No respiratory distress.  Musculoskeletal:     Comments: Wearing adjustable brace on right arm  Neurological:     Mental Status: She is alert and oriented to person, place, and time.  Psychiatric:        Mood and Affect: Mood normal.        Behavior: Behavior normal.        Assessment/Plan: 1. Skin lesion of face Referred to dermatology - Ambulatory referral  to Dermatology  2. Primary generalized (osteo)arthritis Continue tramadol as prescribed. Follow up in 3 months for additional refills.  - traMADol (ULTRAM) 50 MG tablet; Take 2 tablets (100 mg total) by mouth every 12 (twelve) hours as needed for moderate pain.  Dispense: 120 tablet; Refill: 2  3. Essential hypertension Stable, not on medication currently  4. Cigarette smoker motivated to quit Will try to prescribe chantix in the new year and see if her insurance will cover it.      General Counseling: Alexandria Murray verbalizes understanding of the findings of todays visit and agrees with plan of  treatment. I have discussed any further diagnostic evaluation that may be needed or ordered today. We also reviewed her medications today. she has been encouraged to call the office with any questions or concerns that should arise related to todays visit.    Orders Placed This Encounter  Procedures   Ambulatory referral to Dermatology    Meds ordered this encounter  Medications   traMADol (ULTRAM) 50 MG tablet    Sig: Take 2 tablets (100 mg total) by mouth every 12 (twelve) hours as needed for moderate pain.    Dispense:  120 tablet    Refill:  2    Return in about 3 months (around 10/29/2023) for F/U, pain med refill, Kandon Hosking PCP.   Total time spent:30 Minutes Time spent includes review of chart, medications, test results, and follow up plan with the patient.   [place] Controlled Substance Database was reviewed by me.  This patient was seen by Sallyanne Kuster, FNP-C in collaboration with Dr. Beverely Risen as a part of collaborative care agreement.   Alexandria Murray R. Tedd Sias, MSN, FNP-C Internal medicine

## 2023-08-06 LAB — LIPID PANEL
Chol/HDL Ratio: 4.2 {ratio} (ref 0.0–4.4)
Cholesterol, Total: 181 mg/dL (ref 100–199)
HDL: 43 mg/dL (ref >39)
LDL Chol Calc (NIH): 101 mg/dL — ABNORMAL HIGH (ref 0–99)
Triglycerides: 218 mg/dL — ABNORMAL HIGH (ref 0–149)
VLDL Cholesterol Cal: 37 mg/dL (ref 5–40)

## 2023-08-06 LAB — CMP14+EGFR
ALT: 26 [IU]/L (ref 0–32)
AST: 21 [IU]/L (ref 0–40)
Albumin: 4.4 g/dL (ref 3.9–4.9)
Alkaline Phosphatase: 112 [IU]/L (ref 44–121)
BUN/Creatinine Ratio: 30 — ABNORMAL HIGH (ref 9–23)
BUN: 30 mg/dL — ABNORMAL HIGH (ref 6–24)
Bilirubin Total: 0.4 mg/dL (ref 0.0–1.2)
CO2: 26 mmol/L (ref 20–29)
Calcium: 9.5 mg/dL (ref 8.7–10.2)
Chloride: 102 mmol/L (ref 96–106)
Creatinine, Ser: 0.99 mg/dL (ref 0.57–1.00)
Globulin, Total: 2.1 g/dL (ref 1.5–4.5)
Glucose: 114 mg/dL — ABNORMAL HIGH (ref 70–99)
Potassium: 5.1 mmol/L (ref 3.5–5.2)
Sodium: 140 mmol/L (ref 134–144)
Total Protein: 6.5 g/dL (ref 6.0–8.5)
eGFR: 70 mL/min/{1.73_m2} (ref >59)

## 2023-08-06 LAB — IRON,TIBC AND FERRITIN PANEL
Ferritin: 73 ng/mL (ref 15–150)
Iron Saturation: 15 % (ref 15–55)
Iron: 56 ug/dL (ref 27–159)
Total Iron Binding Capacity: 375 ug/dL (ref 250–450)
UIBC: 319 ug/dL (ref 131–425)

## 2023-08-06 LAB — TSH+FREE T4
Free T4: 1.4 ng/dL (ref 0.82–1.77)
TSH: 3.59 u[IU]/mL (ref 0.450–4.500)

## 2023-08-06 LAB — CBC WITH DIFFERENTIAL/PLATELET
Basophils Absolute: 0.1 10*3/uL (ref 0.0–0.2)
Basos: 1 %
EOS (ABSOLUTE): 0.2 10*3/uL (ref 0.0–0.4)
Eos: 2 %
Hematocrit: 50.1 % — ABNORMAL HIGH (ref 34.0–46.6)
Hemoglobin: 16.6 g/dL — ABNORMAL HIGH (ref 11.1–15.9)
Immature Grans (Abs): 0 10*3/uL (ref 0.0–0.1)
Immature Granulocytes: 0 %
Lymphocytes Absolute: 2.5 10*3/uL (ref 0.7–3.1)
Lymphs: 32 %
MCH: 31.1 pg (ref 26.6–33.0)
MCHC: 33.1 g/dL (ref 31.5–35.7)
MCV: 94 fL (ref 79–97)
Monocytes Absolute: 0.5 10*3/uL (ref 0.1–0.9)
Monocytes: 7 %
Neutrophils Absolute: 4.5 10*3/uL (ref 1.4–7.0)
Neutrophils: 58 %
Platelets: 227 10*3/uL (ref 150–450)
RBC: 5.34 x10E6/uL — ABNORMAL HIGH (ref 3.77–5.28)
RDW: 13 % (ref 11.7–15.4)
WBC: 7.7 10*3/uL (ref 3.4–10.8)

## 2023-08-06 LAB — B12 AND FOLATE PANEL
Folate: 14.5 ng/mL (ref >3.0)
Vitamin B-12: 387 pg/mL (ref 232–1245)

## 2023-08-06 LAB — VITAMIN D 25 HYDROXY (VIT D DEFICIENCY, FRACTURES): Vit D, 25-Hydroxy: 35.8 ng/mL (ref 30.0–100.0)

## 2023-08-08 ENCOUNTER — Other Ambulatory Visit: Payer: Self-pay | Admitting: Nurse Practitioner

## 2023-08-08 DIAGNOSIS — F5101 Primary insomnia: Secondary | ICD-10-CM

## 2023-08-28 ENCOUNTER — Other Ambulatory Visit: Payer: Self-pay | Admitting: Nurse Practitioner

## 2023-08-28 DIAGNOSIS — Z79899 Other long term (current) drug therapy: Secondary | ICD-10-CM

## 2023-09-09 ENCOUNTER — Telehealth: Payer: Self-pay | Admitting: Internal Medicine

## 2023-09-09 ENCOUNTER — Ambulatory Visit: Payer: BC Managed Care – PPO | Admitting: Internal Medicine

## 2023-09-09 ENCOUNTER — Encounter: Payer: Self-pay | Admitting: Internal Medicine

## 2023-09-09 VITALS — BP 130/86 | HR 71 | Temp 98.3°F | Resp 16 | Ht 60.0 in | Wt 160.8 lb

## 2023-09-09 DIAGNOSIS — R7309 Other abnormal glucose: Secondary | ICD-10-CM

## 2023-09-09 DIAGNOSIS — K130 Diseases of lips: Secondary | ICD-10-CM

## 2023-09-09 DIAGNOSIS — R0602 Shortness of breath: Secondary | ICD-10-CM | POA: Diagnosis not present

## 2023-09-09 DIAGNOSIS — D751 Secondary polycythemia: Secondary | ICD-10-CM

## 2023-09-09 DIAGNOSIS — R0902 Hypoxemia: Secondary | ICD-10-CM | POA: Diagnosis not present

## 2023-09-09 DIAGNOSIS — F17218 Nicotine dependence, cigarettes, with other nicotine-induced disorders: Secondary | ICD-10-CM

## 2023-09-09 MED ORDER — FAMCICLOVIR 500 MG PO TABS: ORAL_TABLET | ORAL | 1 refills | Status: DC

## 2023-09-09 NOTE — Progress Notes (Signed)
[facility] [address] [place], [place] [address]  Internal MEDICINE  Office Visit Note  Patient Name: Alexandria Murray  [id number]  [id number]  Date of Service: 09/09/2023  Chief Complaint  Patient presents with   Follow-up    Review labs--- Needs colonoscopy    HPI Pt is seen today for abnormal labs follow up She continues to have cough and sob, she is a 1.5/day smoker. Elevated hemoglobin, sleep study was negative for OSA but hypoxia was present  Has h/o Gaulian barre syndrome with residual weakness  Has a lesion on upper lip which is unhealed for 6 months     Current Medication: Outpatient Encounter Medications as of 09/09/2023  Medication Sig   Aspirin-Acetaminophen-Caffeine (PAMPRIN MAX PO) Take 2 tablets by mouth daily.   docusate sodium (COLACE) 100 MG capsule Take 1 capsule (100 mg total) by mouth 2 (two) times daily.   ezetimibe (ZETIA) 10 MG tablet Take 1 tablet (10 mg total) by mouth daily.   famciclovir (FAMVIR) 500 MG tablet Take one tab po bid   ferrous sulfate 325 (65 FE) MG tablet Take 1 tablet (325 mg total) by mouth daily.   folic acid (FOLVITE) 1 MG tablet Take 1 tablet (1 mg total) by mouth daily.   gabapentin (NEURONTIN) 100 MG capsule Take 1 capsule (100 mg total) by mouth 2 (two) times daily.   meloxicam (MOBIC) 15 MG tablet Take 1 tablet (15 mg total) by mouth daily.   Multiple Vitamin (MULTIVITAMIN WITH MINERALS) TABS tablet Take 1 tablet by mouth daily.   Multiple Vitamins-Minerals (MULTIVITAMIN PO) Take 1 tablet by mouth daily.   ondansetron (ZOFRAN-ODT) 4 MG disintegrating tablet DISSOLVE 1 TABLET IN MOUTH EVERY 8 HOURS AS NEEDED FOR NAUSEA OR VOMITING   traMADol (ULTRAM) 50 MG tablet Take 2 tablets (100 mg total) by mouth every 12 (twelve) hours as needed for moderate pain.   traZODone (DESYREL) 100 MG tablet TAKE 1 TABLET BY MOUTH AT BEDTIME AS NEEDED FOR SLEEP   Vitamin D, Ergocalciferol, (DRISDOL) 1.25 MG (50000 UNIT) CAPS capsule  Take 1 capsule by mouth once a week   No facility-administered encounter medications on file as of 09/09/2023.    Surgical History: Past Surgical History:  Procedure Laterality Date   CHOLECYSTECTOMY     COLPOSCOPY  07/26/2015   FINGER SURGERY      Medical History: Past Medical History:  Diagnosis Date   Abnormal Pap smear of cervix 06/20/2015   Anxiety    Cervical high risk human papillomavirus (HPV) DNA test positive    Dysplasia of cervix, low grade (CIN 1) 07/26/2015   Guillain-Barre syndrome (HCC)    Nicotine dependence    Stroke (HCC)    Torn rotator cuff     Family History: Family History  Problem Relation Age of Onset   Cancer Mother    Stroke Paternal Grandfather     Social History   Socioeconomic History   Marital status: Married    Spouse name: Not on file   Number of children: Not on file   Years of education: Not on file   Highest education level: Not on file  Occupational History   Not on file  Tobacco Use   Smoking status: Every Day    Current packs/day: 1.00    Types: Cigarettes   Smokeless tobacco: Never  Vaping Use   Vaping status: Never Used  Substance and Sexual Activity   Alcohol use: No   Drug use: No  Sexual activity: Yes    Birth control/protection: Post-menopausal  Other Topics Concern   Not on file  Social History Narrative   Not on file   Social Determinants of Health   Financial Resource Strain: Not on file  Food Insecurity: Not on file  Transportation Needs: Not on file  Physical Activity: Not on file  Stress: Not on file  Social Connections: Not on file  Intimate Partner Violence: Not on file      Review of Systems  Constitutional:  Negative for fatigue and fever.  HENT:  Negative for congestion, mouth sores and postnasal drip.   Respiratory:  Positive for cough and shortness of breath.   Cardiovascular:  Negative for chest pain.  Genitourinary:  Negative for flank pain.  Skin:        Left upper lip has  sore over 6 months   Psychiatric/Behavioral: Negative.      Vital Signs: BP 130/86   Pulse 71   Temp 98.3 F (36.8 C)   Resp 16   Ht 5' (1.524 m)   Wt 160 lb 12.8 oz (72.9 kg)   LMP 02/02/2018 (Approximate)   SpO2 99%   BMI 31.40 kg/m    Physical Exam Constitutional:      Appearance: Normal appearance.  HENT:     Head: Normocephalic and atraumatic.     Nose: Nose normal.     Mouth/Throat:     Mouth: Mucous membranes are moist.     Pharynx: No posterior oropharyngeal erythema.  Eyes:     Extraocular Movements: Extraocular movements intact.     Pupils: Pupils are equal, round, and reactive to light.  Cardiovascular:     Pulses: Normal pulses.     Heart sounds: Normal heart sounds.  Pulmonary:     Effort: Pulmonary effort is normal.     Breath sounds: Wheezing and rhonchi present.  Skin:    Findings: Lesion present.     Comments: Upper lip ( punched out lesion with rolled margins ) basal cell ca??   Neurological:     General: No focal deficit present.     Mental Status: She is alert.  Psychiatric:        Mood and Affect: Mood normal.        Behavior: Behavior normal.        Assessment/Plan: 1. Hypoxia Sleep study did show hypoxia, will order ONO, will need  - Pulse oximetry, overnight; Future - DG Chest 2 View; Future is ordered but will need CT chest for worsening symptoms of dyspnea  - Pulmonary function test; Future - CT Chest Wo Contrast; Future  2. Shortness of breath As above, will need PFT, ?? Residual of GBS  - DG Chest 2 View; Future - Pulmonary function test; Future - CT Chest Wo Contrast; Future  3. Polycythemia Secondary to smoking, will explore further, might have to see heamtology  - CT Chest Wo Contrast; Future  4. Lesion of lip Duration and appearance is bothersome, however complicated cold sore, will treat with antiviral, suspect basal cell CA, has appointment with dermatology   5. Abnormal glucose Will check hg A1c on next visit      6. Cigarette nicotine dependence with other nicotine-induced disorder Smoking cessation counseling: Pt acknowledges the risks of long term smoking, she will try to quite smoking. Options for different medications including nicotine products, chewing gum, patch etc, Wellbutrin and Chantix is discussed Goal and date of compete cessation is discussed Total time spent in smoking cessation  is 10 min.    General Counseling: Aubrii verbalizes understanding of the findings of todays visit and agrees with plan of treatment. I have discussed any further diagnostic evaluation that may be needed or ordered today. We also reviewed her medications today. she has been encouraged to call the office with any questions or concerns that should arise related to todays visit.    Orders Placed This Encounter  Procedures   DG Chest 2 View   CT Chest Wo Contrast   Pulse oximetry, overnight   Pulmonary function test    Meds ordered this encounter  Medications   famciclovir (FAMVIR) 500 MG tablet    Sig: Take one tab po bid    Dispense:  60 tablet    Refill:  1    Total time spent:35 Minutes Time spent includes review of chart, medications, test results, and follow up plan with the patient.   [place] Controlled Substance Database was reviewed by me.   Dr Lyndon Code Internal medicine

## 2023-09-09 NOTE — Telephone Encounter (Signed)
Notified [facility] of overnight pulseox order-Toni

## 2023-09-14 ENCOUNTER — Telehealth: Payer: Self-pay | Admitting: Internal Medicine

## 2023-09-14 NOTE — Telephone Encounter (Signed)
Lvm regarding chest CT location-Alexandria Murray

## 2023-09-15 ENCOUNTER — Telehealth: Payer: Self-pay | Admitting: Internal Medicine

## 2023-09-15 NOTE — Telephone Encounter (Signed)
Received virtuox results. Gave to dfk-Toni

## 2023-09-16 ENCOUNTER — Telehealth: Payer: Self-pay | Admitting: Nurse Practitioner

## 2023-09-16 NOTE — Telephone Encounter (Signed)
Left vm and sent mychart message to confirm 09/23/23 appointment-Toni

## 2023-09-17 ENCOUNTER — Other Ambulatory Visit: Payer: Self-pay | Admitting: Internal Medicine

## 2023-09-17 DIAGNOSIS — R5381 Other malaise: Secondary | ICD-10-CM

## 2023-09-17 NOTE — Addendum Note (Signed)
Addended by: Lyndon Code on: 09/17/2023 10:20 PM   Modules accepted: Orders

## 2023-09-18 ENCOUNTER — Other Ambulatory Visit: Payer: Self-pay | Admitting: Internal Medicine

## 2023-09-18 DIAGNOSIS — D751 Secondary polycythemia: Secondary | ICD-10-CM

## 2023-09-18 DIAGNOSIS — R0602 Shortness of breath: Secondary | ICD-10-CM

## 2023-09-18 DIAGNOSIS — R0902 Hypoxemia: Secondary | ICD-10-CM

## 2023-09-18 NOTE — Addendum Note (Signed)
Addended by: Lyndon Code on: 09/18/2023 10:12 AM   Modules accepted: Orders

## 2023-09-21 ENCOUNTER — Ambulatory Visit: Payer: BC Managed Care – PPO | Admitting: Dermatology

## 2023-09-21 ENCOUNTER — Encounter: Payer: Self-pay | Admitting: Dermatology

## 2023-09-21 DIAGNOSIS — C4491 Basal cell carcinoma of skin, unspecified: Secondary | ICD-10-CM

## 2023-09-21 DIAGNOSIS — C4401 Basal cell carcinoma of skin of lip: Secondary | ICD-10-CM

## 2023-09-21 DIAGNOSIS — D492 Neoplasm of unspecified behavior of bone, soft tissue, and skin: Secondary | ICD-10-CM | POA: Diagnosis not present

## 2023-09-21 HISTORY — DX: Basal cell carcinoma of skin, unspecified: C44.91

## 2023-09-21 NOTE — Progress Notes (Signed)
   New Patient Visit   Subjective  Alexandria Murray is a 47 y.o. female who presents for the following: Spot. Left upper lip. Dur: few months. Will not heal and go away. Started out like a blackhead. Has gotten larger. Bleeds if scratched. Non tender. Taking oral antiviral prescribed by PCP.    The patient has spots, moles and lesions to be evaluated, some may be new or changing and the patient may have concern these could be cancer.    The following portions of the chart were reviewed this encounter and updated as appropriate: medications, allergies, medical history  Review of Systems:  No other skin or systemic complaints except as noted in HPI or Assessment and Plan.  Objective  Well appearing patient in no apparent distress; mood and affect are within normal limits.  A focused examination was performed of the following areas: Face   Relevant physical exam findings are noted in the Assessment and Plan.  Left Upper Vermilion Lip 8 x 6 mm ulcerated pink papule with telangiectasias           Assessment & Plan   Neoplasm of skin Left Upper Vermilion Lip  Skin / nail biopsy Type of biopsy: tangential   Informed consent: discussed and consent obtained   Timeout: patient name, date of birth, surgical site, and procedure verified   Procedure prep:  Patient was prepped and draped in usual sterile fashion Prep type:  Isopropyl alcohol Anesthesia: the lesion was anesthetized in a standard fashion   Anesthetic:  1% lidocaine w/ epinephrine 1-100,000 buffered w/ 8.4% NaHCO3 Instrument used: DermaBlade   Hemostasis achieved with: pressure and aluminum chloride   Outcome: patient tolerated procedure well   Post-procedure details: sterile dressing applied and wound care instructions given   Dressing type: bandage and petrolatum    Specimen 1 - Surgical pathology Differential Diagnosis: R/O BCC  Check Margins: No  Discussed Mohs surgery pending pathology results. Patient voiced  understanding.      Return if symptoms worsen or fail to improve.  I, Lawson Radar, CMA, am acting as scribe for Elie Goody, MD.   Documentation: I have reviewed the above documentation for accuracy and completeness, and I agree with the above.  Elie Goody, MD

## 2023-09-21 NOTE — Patient Instructions (Signed)
Wound Care Instructions  Cleanse wound gently with soap and water once a day then pat dry with clean gauze. Apply a thin coat of Petrolatum (petroleum jelly, "Vaseline") over the wound (unless you have an allergy to this). We recommend that you use a new, sterile tube of Vaseline. Do not pick or remove scabs. Do not remove the yellow or white "healing tissue" from the base of the wound.  Cover the wound with fresh, clean, nonstick gauze and secure with paper tape. You may use Band-Aids in place of gauze and tape if the wound is small enough, but would recommend trimming much of the tape off as there is often too much. Sometimes Band-Aids can irritate the skin.  You should call the office for your biopsy report after 1 week if you have not already been contacted.  If you experience any problems, such as abnormal amounts of bleeding, swelling, significant bruising, significant pain, or evidence of infection, please call the office immediately.  FOR ADULT SURGERY PATIENTS: If you need something for pain relief you may take 1 extra strength Tylenol (acetaminophen) AND 2 Ibuprofen (200mg  each) together every 4 hours as needed for pain. (do not take these if you are allergic to them or if you have a reason you should not take them.) Typically, you may only need pain medication for 1 to 3 days.    Recommend daily broad spectrum sunscreen SPF 30+ to sun-exposed areas, reapply every 2 hours as needed. Call for new or changing lesions.  Staying in the shade or wearing long sleeves, sun glasses (UVA+UVB protection) and wide brim hats (4-inch brim around the entire circumference of the hat) are also recommended for sun protection.    Due to recent changes in healthcare laws, you may see results of your pathology and/or laboratory studies on MyChart before the doctors have had a chance to review them. We understand that in some cases there may be results that are confusing or concerning to you. Please understand  that not all results are received at the same time and often the doctors may need to interpret multiple results in order to provide you with the best plan of care or course of treatment. Therefore, we ask that you please give [place] 2 business days to thoroughly review all your results before contacting the office for clarification. Should we see a critical lab result, you will be contacted sooner.   If You Need Anything After Your Visit  If you have any questions or concerns for your doctor, please call our main line at [phone] and press option 4 to reach your doctor's medical assistant. If no one answers, please leave a voicemail as directed and we will return your call as soon as possible. Messages left after 4 pm will be answered the following business day.   You may also send [place] a message via MyChart. We typically respond to MyChart messages within 1-2 business days.  For prescription refills, please ask your pharmacy to contact our office. Our fax number is [phone].  If you have an urgent issue when the clinic is closed that cannot wait until the next business day, you can page your doctor at the number below.    Please note that while we do our best to be available for urgent issues outside of office hours, we are not available 24/7.   If you have an urgent issue and are unable to reach [place], you may choose to seek medical care at your doctor's office,  retail clinic, urgent care center, or emergency room.  If you have a medical emergency, please immediately call 911 or go to the emergency department.  Pager Numbers  - Dr. Gwen Pounds: [phone]  - Dr. Roseanne Reno: [phone]  - Dr. Katrinka Blazing: [phone]   In the event of inclement weather, please call our main line at [phone] for an update on the status of any delays or closures.  Dermatology Medication Tips: Please keep the boxes that topical medications come in in order to help keep track of the instructions about where  and how to use these. Pharmacies typically print the medication instructions only on the boxes and not directly on the medication tubes.   If your medication is too expensive, please contact our office at [phone] option 4 or send [place] a message through MyChart.   We are unable to tell what your co-pay for medications will be in advance as this is different depending on your insurance coverage. However, we may be able to find a substitute medication at lower cost or fill out paperwork to get insurance to cover a needed medication.   If a prior authorization is required to get your medication covered by your insurance company, please allow [place] 1-2 business days to complete this process.  Drug prices often vary depending on where the prescription is filled and some pharmacies may offer cheaper prices.  The website www.goodrx.com contains coupons for medications through different pharmacies. The prices here do not account for what the cost may be with help from insurance (it may be cheaper with your insurance), but the website can give you the price if you did not use any insurance.  - You can print the associated coupon and take it with your prescription to the pharmacy.  - You may also stop by our office during regular business hours and pick up a GoodRx coupon card.  - If you need your prescription sent electronically to a different pharmacy, notify our office through [facility] or by phone at [phone] option 4.     Si Usted Necesita Algo Despus de Su Visita  Tambin puede enviarnos un mensaje a travs de Clinical cytogeneticist. Por lo general respondemos a los mensajes de MyChart en el transcurso de 1 a 2 das hbiles.  Para renovar recetas, por favor pida a su farmacia que se ponga en contacto con nuestra oficina. Annie Sable de fax es [place] [phone].  Si tiene un asunto urgente cuando la clnica est cerrada y que no puede esperar hasta el siguiente da hbil, puede llamar/localizar a  su doctor(a) al nmero que aparece a continuacin.   Por favor, tenga en cuenta que aunque hacemos todo lo posible para estar disponibles para asuntos urgentes fuera del horario de [place], no estamos disponibles las 24 horas del da, los 7 [address]  [address] de la [place].   Si tiene un problema urgente y no puede comunicarse con nosotros, puede optar por buscar atencin mdica  en el consultorio de su doctor(a), en una clnica privada, en un centro de atencin urgente o en una sala de emergencias.  Si tiene Engineer, drilling, por favor llame inmediatamente al 911 o vaya a la sala de emergencias.  Nmeros de bper  - Dr. Gwen Pounds: [phone]  - Dra. Roseanne Reno: [phone]  - Dr. Katrinka Blazing: [phone]   En caso de inclemencias del tiempo, por favor llame a Lacy Duverney principal al [phone] para una actualizacin sobre el [place] de cualquier retraso o cierre.  Consejos para la medicacin en dermatologa: Por  favor, guarde las cajas en las que vienen los medicamentos de uso tpico para ayudarle a seguir las instrucciones sobre dnde y cmo usarlos. Las farmacias generalmente imprimen las instrucciones del medicamento slo en las cajas y no directamente en los tubos del [place].   Si su medicamento es muy caro, por favor, pngase en contacto con Rolm [place]ala llamando al [phone] y presione la opcin 4 o envenos un mensaje a travs de Clinical cytogeneticist.   No podemos decirle cul ser su copago por los medicamentos por adelantado ya que esto es diferente dependiendo de la cobertura de su seguro. Sin embargo, es posible que podamos encontrar un medicamento sustituto a Audiological scientist un formulario para que el seguro cubra el medicamento que se considera necesario.   Si se requiere una autorizacin previa para que su compaa de seguros [place] su medicamento, por favor permtanos de 1 a 2 das hbiles para completar [address]  Los precios de los medicamentos varan con frecuencia dependiendo  del Environmental consultant de dnde se surte la receta y alguna farmacias pueden ofrecer precios ms baratos.  El sitio web www.goodrx.com tiene cupones para medicamentos de Health and safety inspector. Los precios aqu no tienen en cuenta lo que podra costar con la ayuda del seguro (puede ser ms barato con su seguro), pero el sitio web puede darle el precio si no utiliz Tourist information centre manager.  - Puede imprimir el cupn correspondiente y llevarlo con su receta a la farmacia.  - Tambin puede pasar por nuestra oficina durante el horario de atencin regular y Education officer, museum una tarjeta de cupones de GoodRx.  - Si necesita que su receta se enve electrnicamente a una farmacia diferente, informe a nuestra oficina a travs de MyChart de [place] o por telfono llamando al [phone] y presione la opcin 4.

## 2023-09-22 ENCOUNTER — Other Ambulatory Visit: Payer: Self-pay | Admitting: Internal Medicine

## 2023-09-22 DIAGNOSIS — G4734 Idiopathic sleep related nonobstructive alveolar hypoventilation: Secondary | ICD-10-CM

## 2023-09-23 ENCOUNTER — Ambulatory Visit: Payer: BC Managed Care – PPO | Admitting: Internal Medicine

## 2023-09-23 ENCOUNTER — Ambulatory Visit
Admission: RE | Admit: 2023-09-23 | Discharge: 2023-09-23 | Disposition: A | Discharge status: Home / Self Care | Payer: BC Managed Care – PPO | Source: Ambulatory Visit | Attending: Internal Medicine | Admitting: Internal Medicine

## 2023-09-23 DIAGNOSIS — F17210 Nicotine dependence, cigarettes, uncomplicated: Secondary | ICD-10-CM | POA: Diagnosis not present

## 2023-09-23 DIAGNOSIS — R0602 Shortness of breath: Secondary | ICD-10-CM

## 2023-09-23 DIAGNOSIS — R0902 Hypoxemia: Secondary | ICD-10-CM | POA: Diagnosis not present

## 2023-09-23 DIAGNOSIS — R053 Chronic cough: Secondary | ICD-10-CM | POA: Diagnosis not present

## 2023-09-23 DIAGNOSIS — D751 Secondary polycythemia: Secondary | ICD-10-CM

## 2023-09-23 LAB — SURGICAL PATHOLOGY

## 2023-09-28 ENCOUNTER — Other Ambulatory Visit: Payer: Self-pay | Admitting: Nurse Practitioner

## 2023-09-28 ENCOUNTER — Telehealth: Payer: Self-pay

## 2023-09-28 DIAGNOSIS — E559 Vitamin D deficiency, unspecified: Secondary | ICD-10-CM

## 2023-09-28 DIAGNOSIS — C4401 Basal cell carcinoma of skin of lip: Secondary | ICD-10-CM

## 2023-09-28 NOTE — Telephone Encounter (Signed)
Discussed pathology results and Mohs procedure. Patient voiced understanding. Patient requested referral to Dr. Patsey Berthold office in [place]. Referral sent.

## 2023-09-28 NOTE — Telephone Encounter (Signed)
-----   Message from [place] sent at 09/25/2023  5:38 PM EST ----- Diagnosis: left upper vermilion lip :       BASAL CELL CARCINOMA, NODULAR AND INFILTRATIVE PATTERNS    Please call with diagnosis and determine where the patient would like to have Mohs surgery.  Explanation: your biopsy shows a basal cell skin cancer in the second layer of the skin. This is the most common kind of skin cancer and is caused by damage from sun exposure. Basal cell skin cancers almost never spread beyond the skin, so they are not dangerous to your overall health. However, they will continue to grow, can bleed, cause nonhealing wounds, and disrupt nearby structures unless fully treated.  Treatment: Given the location and type of skin cancer, I recommend Mohs surgery. Mohs surgery involves cutting out the skin cancer and then checking under the microscope to ensure the whole skin cancer was removed. If any skin cancer remains, the surgeon will cut out more until it is fully removed. The cure rate is about 98-99%. Once the Mohs surgeon confirms the skin cancer is out, they will discuss the options to repair or heal the area. You must take it easy for about two weeks after surgery (no lifting over 10-15 lbs, avoid activity to get your heart rate and blood pressure up). It is done at another office outside of [place] ([place], [place], or [place]).

## 2023-10-02 ENCOUNTER — Other Ambulatory Visit: Payer: Self-pay | Admitting: Nurse Practitioner

## 2023-10-02 DIAGNOSIS — Z79899 Other long term (current) drug therapy: Secondary | ICD-10-CM

## 2023-10-02 DIAGNOSIS — R0902 Hypoxemia: Secondary | ICD-10-CM | POA: Diagnosis not present

## 2023-10-06 ENCOUNTER — Encounter: Payer: Self-pay | Admitting: Internal Medicine

## 2023-10-06 ENCOUNTER — Ambulatory Visit: Payer: BC Managed Care – PPO | Admitting: Internal Medicine

## 2023-10-06 VITALS — BP 125/70 | HR 88 | Temp 98.5°F | Resp 16 | Ht 60.0 in | Wt 156.6 lb

## 2023-10-06 DIAGNOSIS — F17210 Nicotine dependence, cigarettes, uncomplicated: Secondary | ICD-10-CM

## 2023-10-06 DIAGNOSIS — J449 Chronic obstructive pulmonary disease, unspecified: Secondary | ICD-10-CM | POA: Diagnosis not present

## 2023-10-06 DIAGNOSIS — G4734 Idiopathic sleep related nonobstructive alveolar hypoventilation: Secondary | ICD-10-CM | POA: Diagnosis not present

## 2023-10-06 DIAGNOSIS — D751 Secondary polycythemia: Secondary | ICD-10-CM

## 2023-10-06 DIAGNOSIS — M609 Myositis, unspecified: Secondary | ICD-10-CM

## 2023-10-06 DIAGNOSIS — T466X5A Adverse effect of antihyperlipidemic and antiarteriosclerotic drugs, initial encounter: Secondary | ICD-10-CM

## 2023-10-06 DIAGNOSIS — I700 Atherosclerosis of aorta: Secondary | ICD-10-CM

## 2023-10-06 DIAGNOSIS — Z1211 Encounter for screening for malignant neoplasm of colon: Secondary | ICD-10-CM

## 2023-10-06 MED ORDER — BREZTRI AEROSPHERE 160-9-4.8 MCG/ACT IN AERO: 2.0000 | INHALATION_SPRAY | Freq: Two times a day (BID) | RESPIRATORY_TRACT | 11 refills | Status: DC

## 2023-10-06 NOTE — Progress Notes (Unsigned)
[facility] [address] [place], [place] [address]  Internal MEDICINE  Office Visit Note  Patient Name: Alexandria Murray  [id number]  [id number]  Date of Service: 10/14/2023  Chief Complaint  Patient presents with   Follow-up    Review PFT   Quality Metric Gaps    Colonoscopy needed    HPI   Pt is seen for follow up PFT's does show moderate lung disease  CT scan results not available ( unofficial scarring)   Pt does have aortic atherosclerosis, cannot take statin due to myositis, on Zetia Continues to smoke She is on O2 at night  Elevated hg   Current Medication: Outpatient Encounter Medications as of 10/06/2023  Medication Sig   Aspirin-Acetaminophen-Caffeine (PAMPRIN MAX PO) Take 2 tablets by mouth daily.   Budeson-Glycopyrrol-Formoterol (BREZTRI AEROSPHERE) 160-9-4.8 MCG/ACT AERO Inhale 2 puffs into the lungs 2 (two) times daily.   docusate sodium (COLACE) 100 MG capsule Take 1 capsule (100 mg total) by mouth 2 (two) times daily.   ezetimibe (ZETIA) 10 MG tablet Take 1 tablet (10 mg total) by mouth daily.   famciclovir (FAMVIR) 500 MG tablet Take one tab po bid   ferrous sulfate 325 (65 FE) MG tablet Take 1 tablet (325 mg total) by mouth daily.   folic acid (FOLVITE) 1 MG tablet Take 1 tablet (1 mg total) by mouth daily.   gabapentin (NEURONTIN) 100 MG capsule Take 1 capsule (100 mg total) by mouth 2 (two) times daily.   meloxicam (MOBIC) 15 MG tablet Take 1 tablet (15 mg total) by mouth daily.   Multiple Vitamin (MULTIVITAMIN WITH MINERALS) TABS tablet Take 1 tablet by mouth daily.   Multiple Vitamins-Minerals (MULTIVITAMIN PO) Take 1 tablet by mouth daily.   ondansetron (ZOFRAN-ODT) 4 MG disintegrating tablet DISSOLVE 1 TABLET IN MOUTH EVERY 8 HOURS AS NEEDED FOR NAUSEA FOR VOMITING   traMADol (ULTRAM) 50 MG tablet Take 2 tablets (100 mg total) by mouth every 12 (twelve) hours as needed for moderate pain.   traZODone (DESYREL) 100 MG tablet TAKE 1 TABLET  BY MOUTH AT BEDTIME AS NEEDED FOR SLEEP   Vitamin D, Ergocalciferol, (DRISDOL) 1.25 MG (50000 UNIT) CAPS capsule Take 1 capsule by mouth once a week   No facility-administered encounter medications on file as of 10/06/2023.    Surgical History: Past Surgical History:  Procedure Laterality Date   CHOLECYSTECTOMY     COLPOSCOPY  07/26/2015   FINGER SURGERY      Medical History: Past Medical History:  Diagnosis Date   Abnormal Pap smear of cervix 06/20/2015   Anxiety    Basal cell carcinoma 09/21/2023   Left upper vermilion lip. Nodular and infiltrative pattern. Mohs pending.   Cervical high risk human papillomavirus (HPV) DNA test positive    Dysplasia of cervix, low grade (CIN 1) 07/26/2015   Guillain-Barre syndrome (HCC)    Nicotine dependence    Stroke (HCC)    Torn rotator cuff     Family History: Family History  Problem Relation Age of Onset   Cancer Mother    Stroke Paternal Grandfather     Social History   Socioeconomic History   Marital status: Married    Spouse name: Not on file   Number of children: Not on file   Years of education: Not on file   Highest education level: Not on file  Occupational History   Not on file  Tobacco Use   Smoking status: Every Day    Current packs/day:  1.00    Types: Cigarettes   Smokeless tobacco: Never   Tobacco comments:    1 PPD  Vaping Use   Vaping status: Never Used  Substance and Sexual Activity   Alcohol use: No   Drug use: No   Sexual activity: Yes    Birth control/protection: Post-menopausal  Other Topics Concern   Not on file  Social History Narrative   Not on file   Social Drivers of Health   Financial Resource Strain: Not on file  Food Insecurity: Not on file  Transportation Needs: Not on file  Physical Activity: Not on file  Stress: Not on file  Social Connections: Not on file  Intimate Partner Violence: Not on file      Review of Systems  Constitutional:  Negative for chills, fatigue and  unexpected weight change.  HENT:  Positive for postnasal drip. Negative for congestion, rhinorrhea, sneezing and sore throat.   Eyes:  Negative for redness.  Respiratory:  Negative for cough, chest tightness and shortness of breath.   Cardiovascular:  Negative for chest pain and palpitations.  Gastrointestinal:  Negative for abdominal pain, constipation, diarrhea, nausea and vomiting.  Genitourinary:  Negative for dysuria and frequency.  Musculoskeletal:  Negative for arthralgias, back pain, joint swelling and neck pain.  Skin:  Negative for rash.  Neurological: Negative.  Negative for tremors and numbness.  Hematological:  Negative for adenopathy. Does not bruise/bleed easily.  Psychiatric/Behavioral:  Negative for behavioral problems (Depression), sleep disturbance and suicidal ideas. The patient is not nervous/anxious.     Vital Signs: BP 125/70   Pulse 88   Temp 98.5 F (36.9 C)   Resp 16   Ht 5' (1.524 m)   Wt 156 lb 9.6 oz (71 kg)   LMP 02/02/2018 (Approximate)   SpO2 95%   BMI 30.58 kg/m    Physical Exam Constitutional:      Appearance: Normal appearance.  HENT:     Head: Normocephalic and atraumatic.     Nose: Nose normal.     Mouth/Throat:     Mouth: Mucous membranes are moist.     Pharynx: No posterior oropharyngeal erythema.  Eyes:     Extraocular Movements: Extraocular movements intact.     Pupils: Pupils are equal, round, and reactive to light.  Cardiovascular:     Pulses: Normal pulses.     Heart sounds: Normal heart sounds.  Pulmonary:     Effort: Pulmonary effort is normal.     Breath sounds: Normal breath sounds.  Neurological:     General: No focal deficit present.     Mental Status: She is alert.  Psychiatric:        Mood and Affect: Mood normal.        Behavior: Behavior normal.        Assessment/Plan: 1. 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 Polycythemia Multifactorial, GBS or nicotine abuse  - Ambulatory referral to Pulmonology - CBC with Differential/Platelet  3. Chronic obstructive pulmonary disease, unspecified COPD type (HCC) Start meds as prescribed , CT chest is discussed will see pulmonary for further diagnosis and treatment  - Budeson-Glycopyrrol-Formoterol (BREZTRI AEROSPHERE) 160-9-4.8 MCG/ACT AERO; Inhale 2 puffs into the lungs 2 (two) times daily.  Dispense: 10.7 g; Refill: 11 - Ambulatory referral to Pulmonology  4. Cigarette nicotine dependence without complication Encouraged smoking cessation   5. 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 Colon cancer screening - Cologuard   General Counseling: Nakisha verbalizes understanding of the findings of todays visit and agrees with plan of treatment. I have discussed any further diagnostic evaluation that may be needed or ordered today. We also reviewed her medications today. she has been encouraged to call the office with any questions or concerns that should arise related to todays visit.    Orders Placed This Encounter  Procedures   CBC with Differential/Platelet   Cologuard   Ambulatory referral to Pulmonology    Meds ordered this encounter  Medications   Budeson-Glycopyrrol-Formoterol (BREZTRI AEROSPHERE) 160-9-4.8 MCG/ACT AERO    Sig: Inhale 2 puffs into the lungs 2 (two) times daily.    Dispense:  10.7 g    Refill:  11    Total time spent:45 Minutes Time spent includes review of chart, medications, test results, and follow up plan with the patient.   [place] Controlled Substance Database was reviewed by me.   Dr Lyndon Code Internal medicine

## 2023-10-18 NOTE — Procedures (Signed)
[facility] [address] [place], [address]    Complete Pulmonary Function Testing Interpretation:  FINDINGS:  The forced vital capacity is mildly decreased.  FEV1 is 1.72 L which is 68% of predicted and is mildly decreased.  FEV1 FVC ratio was decreased.  Postbronchodilator there is no significant change in FEV1 clinical improvement may still occur total lung capacity is normal residual volume is increased FRC is normal residual volume total lung capacity ratio is increased DLCO was severely decreased  IMPRESSION:  This pulmonary function study is consistent with mild obstructive lung disease clinical correlation is recommended.  Yevonne Pax, MD [facility] Pulmonary Critical Care Medicine Sleep Medicine

## 2023-11-02 ENCOUNTER — Ambulatory Visit (INDEPENDENT_AMBULATORY_CARE_PROVIDER_SITE_OTHER): Payer: BC Managed Care – PPO | Admitting: Internal Medicine

## 2023-11-02 ENCOUNTER — Encounter: Payer: Self-pay | Admitting: Internal Medicine

## 2023-11-02 VITALS — BP 140/95 | HR 76 | Temp 98.7°F | Resp 16 | Ht 60.0 in | Wt 151.2 lb

## 2023-11-02 DIAGNOSIS — R0902 Hypoxemia: Secondary | ICD-10-CM | POA: Diagnosis not present

## 2023-11-02 DIAGNOSIS — Z8669 Personal history of other diseases of the nervous system and sense organs: Secondary | ICD-10-CM | POA: Diagnosis not present

## 2023-11-02 DIAGNOSIS — R911 Solitary pulmonary nodule: Secondary | ICD-10-CM

## 2023-11-02 DIAGNOSIS — F17210 Nicotine dependence, cigarettes, uncomplicated: Secondary | ICD-10-CM | POA: Diagnosis not present

## 2023-11-02 DIAGNOSIS — G4734 Idiopathic sleep related nonobstructive alveolar hypoventilation: Secondary | ICD-10-CM

## 2023-11-02 DIAGNOSIS — J449 Chronic obstructive pulmonary disease, unspecified: Secondary | ICD-10-CM

## 2023-11-02 NOTE — Progress Notes (Signed)
 [facility] [address] [place], [place] [address]  Pulmonary Sleep Medicine   Office Visit Note  Patient Name: Alexandria Murray DOB: 01-20-1976 MRN [id number]  Date of Service: 11/02/2023  Complaints/HPI: She is here for COPD. She had PFT done in November and this reveals presence of MILD COPD FEV1 68% She states she smokes 2PPD and is trying.DLCO was also low. She does experience shortness of breath. She states she has had cough. She works at [employer] it involves lifting. She also had GBS and a stroke in the past. She had a PSG done in 2022 shows no significant OSA at that time however she has had GBS noted and she sstates she still has muscle weakness. CT chest done in November small nodule noted will need following. She has not had a sniff test  Office Spirometry Results:     ROS  General: (-) fever, (-) chills, (-) night sweats, (-) weakness Skin: (-) rashes, (-) itching,. Eyes: (-) visual changes, (-) redness, (-) itching. Nose and Sinuses: (-) nasal stuffiness or itchiness, (-) postnasal drip, (-) nosebleeds, (-) sinus trouble. Mouth and Throat: (-) sore throat, (-) hoarseness. Neck: (-) swollen glands, (-) enlarged thyroid , (-) neck pain. Respiratory: - cough, (-) bloody sputum, + shortness of breath, - wheezing. Cardiovascular: - ankle swelling, (-) chest pain. Lymphatic: (-) lymph node enlargement. Neurologic: (-) numbness, (-) tingling. Psychiatric: (-) anxiety, (-) depression   Current Medication: Outpatient Encounter Medications as of 11/02/2023  Medication Sig   Aspirin -Acetaminophen -Caffeine  (PAMPRIN MAX PO) Take 2 tablets by mouth daily.   Budeson-Glycopyrrol-Formoterol (BREZTRI  AEROSPHERE) 160-9-4.8 MCG/ACT AERO Inhale 2 puffs into the lungs 2 (two) times daily.   docusate sodium  (COLACE) 100 MG capsule Take 1 capsule (100 mg total) by mouth 2 (two) times daily.   ezetimibe  (ZETIA ) 10 MG tablet Take 1 tablet (10 mg total) by mouth daily.   famciclovir   (FAMVIR ) 500 MG tablet Take one tab po bid   ferrous sulfate  325 (65 FE) MG tablet Take 1 tablet (325 mg total) by mouth daily.   folic acid  (FOLVITE ) 1 MG tablet Take 1 tablet (1 mg total) by mouth daily.   gabapentin  (NEURONTIN ) 100 MG capsule Take 1 capsule (100 mg total) by mouth 2 (two) times daily.   meloxicam  (MOBIC ) 15 MG tablet Take 1 tablet (15 mg total) by mouth daily.   Multiple Vitamin (MULTIVITAMIN WITH MINERALS) TABS tablet Take 1 tablet by mouth daily.   Multiple Vitamins-Minerals (MULTIVITAMIN PO) Take 1 tablet by mouth daily.   ondansetron  (ZOFRAN -ODT) 4 MG disintegrating tablet DISSOLVE 1 TABLET IN MOUTH EVERY 8 HOURS AS NEEDED FOR NAUSEA FOR VOMITING   traMADol  (ULTRAM ) 50 MG tablet Take 2 tablets (100 mg total) by mouth every 12 (twelve) hours as needed for moderate pain.   traZODone  (DESYREL ) 100 MG tablet TAKE 1 TABLET BY MOUTH AT BEDTIME AS NEEDED FOR SLEEP   Vitamin D , Ergocalciferol , (DRISDOL ) 1.25 MG (50000 UNIT) CAPS capsule Take 1 capsule by mouth once a week   No facility-administered encounter medications on file as of 11/02/2023.    Surgical History: Past Surgical History:  Procedure Laterality Date   CHOLECYSTECTOMY     COLPOSCOPY  07/26/2015   FINGER SURGERY      Medical History: Past Medical History:  Diagnosis Date   Abnormal Pap smear of cervix 06/20/2015   Anxiety    Basal cell carcinoma 09/21/2023   Left upper vermilion lip. Nodular and infiltrative pattern. Mohs pending.   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/26/2015   Guillain-Barre syndrome (HCC)    Nicotine  dependence    Stroke [facility])    Torn rotator cuff     Family History: Family History  Problem Relation Age of Onset   Cancer Mother    Stroke Paternal Grandfather     Social History: Social History   Socioeconomic History   Marital status: Married    Spouse name: Not on file   Number of children: Not on  file   Years of education: Not on file   Highest education level: Not on file  Occupational History   Not on file  Tobacco Use   Smoking status: Every Day    Current packs/day: 1.00    Types: Cigarettes   Smokeless tobacco: Never   Tobacco comments:    2 PPD  Vaping Use   Vaping status: Never Used  Substance and Sexual Activity   Alcohol use: No   Drug use: No   Sexual activity: Yes    Birth control/protection: Post-menopausal  Other Topics Concern   Not on file  Social History Narrative   Not on file   Social Drivers of Health   Financial Resource Strain: Not on file  Food Insecurity: Not on file  Transportation Needs: Not on file  Physical Activity: Not on file  Stress: Not on file  Social Connections: Not on file  Intimate Partner Violence: Not on file    Vital Signs: Blood pressure (!) 140/95, pulse 76, temperature 98.7 F (37.1 C), resp. rate 16, height 5' (1.524 m), weight 151 lb 3.2 oz (68.6 kg), last menstrual period 02/02/2018, SpO2 95%.  Examination: General Appearance: The patient is well-developed, well-nourished, and in no distress. Skin: Gross inspection of skin unremarkable. Head: normocephalic, no gross deformities. Eyes: no gross deformities noted. ENT: ears appear grossly normal no exudates. Neck: Supple. No thyromegaly. No LAD. Respiratory: few rhonch noted. Cardiovascular: Normal S1 and S2 without murmur or rub. Extremities: No cyanosis. pulses are equal. Neurologic: Alert and oriented. No involuntary movements.  LABS: Recent Results (from the past 2160 hours)  CBC with Differential/Platelet     Status: Abnormal   Collection Time: 08/05/23  9:28 AM  Result Value Ref Range   WBC 7.7 3.4 - 10.8 x10E3/uL   RBC 5.34 (H) 3.77 - 5.28 x10E6/uL   Hemoglobin 16.6 (H) 11.1 - 15.9 g/dL   Hematocrit 49.8 (H) 65.9 - 46.6 %   MCV 94 79 - 97 fL   MCH 31.1 26.6 - 33.0 pg   MCHC 33.1 31.5 - 35.7 g/dL   RDW 86.9 88.2 - 84.5 %   Platelets 227 150 - 450  x10E3/uL   Neutrophils 58 Not Estab. %   Lymphs 32 Not Estab. %   Monocytes 7 Not Estab. %   Eos 2 Not Estab. %   Basos 1 Not Estab. %   Neutrophils Absolute 4.5 1.4 - 7.0 x10E3/uL   Lymphocytes Absolute 2.5 0.7 - 3.1 x10E3/uL   Monocytes Absolute 0.5 0.1 - 0.9 x10E3/uL   EOS (ABSOLUTE) 0.2 0.0 - 0.4 x10E3/uL   Basophils Absolute 0.1 0.0 - 0.2 x10E3/uL   Immature Granulocytes 0 Not Estab. %   Immature Grans (Abs) 0.0 0.0 - 0.1 x10E3/uL  CMP14+EGFR     Status: Abnormal   Collection Time: 08/05/23  9:28 AM  Result Value Ref Range   Glucose 114 (H) 70 - 99 mg/dL  BUN 30 (H) 6 - 24 mg/dL   Creatinine, Ser 9.00 0.57 - 1.00 mg/dL   eGFR 70 >40 fO/fpw/8.26   BUN/Creatinine Ratio 30 (H) 9 - 23   Sodium 140 134 - 144 mmol/L   Potassium 5.1 3.5 - 5.2 mmol/L   Chloride 102 96 - 106 mmol/L   CO2 26 20 - 29 mmol/L   Calcium  9.5 8.7 - 10.2 mg/dL   Total Protein 6.5 6.0 - 8.5 g/dL   Albumin 4.4 3.9 - 4.9 g/dL   Globulin, Total 2.1 1.5 - 4.5 g/dL   Bilirubin Total 0.4 0.0 - 1.2 mg/dL   Alkaline Phosphatase 112 44 - 121 IU/L   AST 21 0 - 40 IU/L   ALT 26 0 - 32 IU/L  Vitamin D  (25 hydroxy)     Status: None   Collection Time: 08/05/23  9:28 AM  Result Value Ref Range   Vit D, 25-Hydroxy 35.8 30.0 - 100.0 ng/mL    Comment: Vitamin D  deficiency has been defined by the Institute of Medicine and an Endocrine Society practice guideline as a level of serum 25-OH vitamin D  less than 20 ng/mL (1,2). The Endocrine Society went on to further define vitamin D  insufficiency as a level between 21 and 29 ng/mL (2). 1. IOM (Institute of Medicine). 2010. Dietary reference    intakes for calcium  and D. [place]: The    [employer]. 2. Holick MF, Binkley [place], Bischoff-Ferrari HA, et al.    Evaluation, treatment, and prevention of vitamin D     deficiency: an Endocrine Society clinical practice    guideline. JCEM. 2011 Jul; 96(7):1911-30.   TSH + free T4     Status: None   Collection  Time: 08/05/23  9:28 AM  Result Value Ref Range   TSH 3.590 0.450 - 4.500 uIU/mL   Free T4 1.40 0.82 - 1.77 ng/dL  Lipid Profile     Status: Abnormal   Collection Time: 08/05/23  9:28 AM  Result Value Ref Range   Cholesterol, Total 181 100 - 199 mg/dL   Triglycerides 781 (H) 0 - 149 mg/dL   HDL 43 >60 mg/dL   VLDL Cholesterol Cal 37 5 - 40 mg/dL   LDL Chol Calc (NIH) 898 (H) 0 - 99 mg/dL   Chol/HDL Ratio 4.2 0.0 - 4.4 ratio    Comment:                                   T. Chol/HDL Ratio                                             Men  Women                               1/2 Avg.Risk  3.4    3.3                                   Avg.Risk  5.0    4.4                                2X Avg.Risk  9.6    7.1                                3X Avg.Risk 23.4   11.0   Iron, TIBC and Ferritin Panel     Status: None   Collection Time: 08/05/23  9:28 AM  Result Value Ref Range   Total Iron Binding Capacity 375 250 - 450 ug/dL   UIBC 680 868 - 574 ug/dL   Iron 56 27 - 840 ug/dL   Iron Saturation 15 15 - 55 %   Ferritin 73 15 - 150 ng/mL  B12 and Folate Panel     Status: None   Collection Time: 08/05/23  9:28 AM  Result Value Ref Range   Vitamin B-12 387 232 - 1,245 pg/mL   Folate 14.5 >3.0 ng/mL    Comment: A serum folate concentration of less than 3.1 ng/mL is considered to represent clinical deficiency.   Surgical pathology     Status: None   Collection Time: 09/21/23 12:00 AM  Result Value Ref Range   SURGICAL PATHOLOGY      SURGICAL PATHOLOGY [facility] [address] [place], [place] [address] Telephone [phone] or [phone] Fax [phone]  REPORT OF DERMATOPATHOLOGY   Accession #: [id number] Patient Name: Alexandria Murray, Alexandria Murray Visit # : [id number]  MRN: [id number] Cytotechnologist: Ephriam Rolla Edelman, Dermatopathologist, Electronic Signature DOB/Age [birth year] (Age: 18) Gender: F Collected Date: 09/21/2023 Received Date:  09/21/2023  FINAL DIAGNOSIS       1. Skin, left upper vermilion lip :       BASAL CELL CARCINOMA, NODULAR AND INFILTRATIVE PATTERNS       DATE SIGNED OUT: 09/23/2023 ELECTRONIC SIGNATURE : Depcik-Smith Md, Natalie, Dermatopathologist, Electronic Signature  MICROSCOPIC DESCRIPTION 1. There are nodules and infiltrating small aggregates and strands of atypical basaloid cells in the dermis. There is palisading of nuclei at the periphery of these aggregates.  CASE COMMENTS STAINS USED IN DIAGNOSIS: H&E    CLINI CAL HISTORY  SPECIMEN(S) OBTAINED 1. Skin, Left Upper Vermilion Lip  SPECIMEN COMMENTS: 1. 8 x 6 mm ulcerated pink papule with telangiectasias SPECIMEN CLINICAL INFORMATION: 1. Neoplasm of skin, R/O BCC    Gross Description 1. Formalin fixed specimen received:  5 X 4 X 1 MM, TOTO (2 P) (1 B) ( tg )        Report signed out from the following location(s) [place] [address] [place], [place] [address] CLIA #: [id number]  [facility] [address] [place], [place] [address] CLIA #: [id number]     Radiology: CT Chest Wo Contrast Result Date: 10/07/2023 CLINICAL DATA:  Chronic cough and shortness of breath.  Smoker. EXAM: CT CHEST WITHOUT CONTRAST TECHNIQUE: Multidetector CT imaging of the chest was performed following the standard protocol without IV contrast. RADIATION DOSE REDUCTION: This exam was performed according to the departmental dose-optimization program which includes automated exposure control, adjustment of the mA and/or kV according to patient size and/or use of iterative reconstruction technique. COMPARISON:  CT abdomen pelvis dated Mar 01, 2021. FINDINGS: Cardiovascular: No significant vascular findings. Normal heart size. No pericardial effusion. No thoracic aortic aneurysm. Coronary, aortic arch, and branch vessel atherosclerotic vascular disease. Mediastinum/Nodes: No enlarged mediastinal or axillary  lymph nodes. Thyroid  gland, trachea, and esophagus demonstrate no significant findings. Lungs/Pleura: No focal consolidation, pleural effusion, or pneumothorax. 4 x 3 mm solid  pulmonary nodule in the left lower lobe (series 3, image 103). No follow-up imaging is recommended. Upper Abdomen: No acute abnormality. Unchanged bilateral adrenal nodules previously characterized as adenomas. No follow-up imaging is recommended. Musculoskeletal: No acute or significant osseous findings. IMPRESSION: 1. No acute intrathoracic process. 2.  Aortic Atherosclerosis (ICD10-I70.0). Electronically Signed   By: Elsie ONEIDA Shoulder M.D.   On: 10/07/2023 11:20    No results found.  No results found.  Assessment and Plan: Patient Active Problem List   Diagnosis Date Noted   Other hypersomnia 04/11/2021   Encounter for general adult medical examination with abnormal findings 10/10/2020   Encounter for screening mammogram for malignant neoplasm of breast 10/10/2020   Local superficial swelling, mass or lump 10/04/2018   Screening for malignant neoplasm of cervix 10/04/2018   Dysuria 10/04/2018   Primary generalized (osteo)arthritis 02/11/2018   Depression, major, single episode, moderate (HCC) 02/11/2018   Primary insomnia 02/11/2018   Nausea 02/11/2018   Routine cervical smear 02/11/2018   Pain in unspecified shoulder 11/11/2017   Mixed hyperlipidemia 11/11/2017   Generalized anxiety disorder 11/11/2017   Nicotine  dependence, cigarettes, uncomplicated 11/11/2017   Iron deficiency anemia 11/11/2017   CIN II (cervical intraepithelial neoplasia II) 07/13/2017   Cervical radiculopathy 06/03/2017   Left ovarian cyst 04/20/2017   Left carpal tunnel syndrome 12/01/2016   History of Guillain-Barre syndrome 09/30/2016   Other symptoms and signs involving the musculoskeletal system 09/30/2016   History of stroke 06/22/2015   Bell's palsy 06/05/2015   Left shoulder tendonitis 06/05/2015   Essential hypertension  06/05/2015   Cigarette smoker    Guillain Barr syndrome (HCC) 05/28/2015    1. Chronic obstructive pulmonary disease, unspecified COPD type (HCC) (Primary)   Patient has mild COPD based on the findings of the pulmonary function.  I spoke to her at length about how to prevent exacerbations.  She needs to work on smoking cessation.  In addition to that she had some issues with nocturnal hypoxia will benefit from using oxygen  also.  2. Nocturnal hypoxemia   Patient has nocturnal hypoxia she needs to use oxygen  at nighttime.  In addition to that she needs to stop smoking as has already been discussed  3. Cigarette nicotine  dependence without complication  Multiple times with spoke about smoking cessation.  Patient done is in agreement to make a try  4. History of Guillain-Barre syndrome  She has a history of the Guillain-Barre syndrome I have suggested that we do a sniff test to assess diaphragmatic function - DG Sniff Test; Future  5. Pulmonary nodule  Pulmonary nodule was noted we will need to follow this she has a history of smoking therefore will need to get a scan  annually.  The last scan was September 23, 2023   General Counseling: I have discussed the findings of the evaluation and examination with Alexandria Murray.  I have also discussed any further diagnostic evaluation thatmay be needed or ordered today. Velina verbalizes understanding of the findings of todays visit. We also reviewed her medications today and discussed drug interactions and side effects including but not limited excessive drowsiness and altered mental states. We also discussed that there is always a risk not just to her but also people around her. she has been encouraged to call the office with any questions or concerns that should arise related to todays visit.  No orders of the defined types were placed in this encounter.    Time spent: 20  I have personally obtained a history,  examined the patient, evaluated laboratory and  imaging results, formulated the assessment and plan and placed orders.    Elfreda DELENA Bathe, MD [facility] Pulmonary and Critical Care Sleep medicine

## 2023-11-02 NOTE — Patient Instructions (Signed)
Chronic Obstructive Pulmonary Disease  Chronic obstructive pulmonary disease (COPD) is a long-term (chronic) lung problem. When you have COPD, it is hard for air to get in and out of your lungs. Usually the condition gets worse over time, and your lungs will never return to normal. There are things you can do to keep yourself as healthy as possible. What are the causes? Smoking. This is the most common cause. Certain genes passed from parent to child (inherited). What increases the risk? Being exposed to secondhand smoke from cigarettes, pipes, or cigars. Being exposed to chemicals and other irritants, such as fumes and dust in the work environment. Having chronic lung conditions or infections. What are the signs or symptoms? Shortness of breath, especially during physical activity. A long-term cough with a large amount of thick mucus. Sometimes, the cough may not have any mucus (dry cough). Wheezing. Breathing quickly. Skin that looks gray or blue, especially in the fingers, toes, or lips. Feeling tired (fatigue). Weight loss. Chest tightness. Having infections often. Episodes when breathing symptoms become much worse (exacerbations). At the later stages of this disease, you may have swelling in the ankles, feet, or legs. How is this treated? Taking medicines. Quitting smoking, if you smoke. Rehabilitation. This includes steps to make your body work better. It may involve a team of specialists. Doing exercises. Making changes to your diet. Using oxygen. Lung surgery. Lung transplant. Comfort measures (palliative care). Follow these instructions at home: Medicines Take over-the-counter and prescription medicines only as told by your doctor. Talk to your doctor before taking any cough or allergy medicines. You may need to avoid medicines that cause your lungs to be dry. Lifestyle If you smoke, stop smoking. Smoking makes the problem worse. Do not smoke or use any products that  contain nicotine or tobacco. If you need help quitting, ask your doctor. Avoid being around things that make your breathing worse. This may include smoke, chemicals, and fumes. Stay active, but remember to rest as well. Learn and use tips on how to manage stress and control your breathing. Make sure you get enough sleep. Most adults need at least 7 hours of sleep every night. Eat healthy foods. Eat smaller meals more often. Rest before meals. Controlled breathing Learn and use tips on how to control your breathing as told by your doctor. Try: Breathing in (inhaling) through your nose for 1 second. Then, pucker your lips and breath out (exhale) through your lips for 2 seconds. Putting one hand on your belly (abdomen). Breathe in slowly through your nose for 1 second. Your hand on your belly should move out. Pucker your lips and breathe out slowly through your lips. Your hand on your belly should move in as you breathe out.  Controlled coughing Learn and use controlled coughing to clear mucus from your lungs. Follow these steps: Lean your head a little forward. Breathe in deeply. Try to hold your breath for 3 seconds. Keep your mouth slightly open while coughing 2 times. Spit any mucus out into a tissue. Rest and do the steps again 1 or 2 times as needed. General instructions Make sure you get all the shots (vaccines) that your doctor recommends. Ask your doctor about a flu shot and a pneumonia shot. Use oxygen therapy and pulmonary rehabilitation if told by your doctor. If you need home oxygen therapy, ask your doctor if you should buy a tool to measure your oxygen level (oximeter). Make a COPD action plan with your doctor. This helps you   to know what to do if you feel worse than usual. Manage any other conditions you have as told by your doctor. Avoid going outside when it is very hot, cold, or humid. Avoid people who have a sickness you can catch (contagious). Keep all follow-up  visits. Contact a doctor if: You cough up more mucus than usual. There is a change in the color or thickness of the mucus. It is harder to breathe than usual. Your breathing is faster than usual. You have trouble sleeping. You need to use your medicines more often than usual. You have trouble doing your normal activities such as getting dressed or walking around the house. Get help right away if: You have shortness of breath while resting. You have shortness of breath that stops you from: Being able to talk. Doing normal activities. Your chest hurts for longer than 5 minutes. Your skin color is more blue than usual. Your pulse oximeter shows that you have low oxygen for longer than 5 minutes. You have a fever. You feel too tired to breathe normally. These symptoms may represent a serious problem that is an emergency. Do not wait to see if the symptoms will go away. Get medical help right away. Call your local emergency services (911 in the [place] Do not drive yourself to the hospital. Summary Chronic obstructive pulmonary disease (COPD) is a long-term lung problem. The way your lungs work will never return to normal. Usually the condition gets worse over time. There are things you can do to keep yourself as healthy as possible. Take over-the-counter and prescription medicines only as told by your doctor. If you smoke, stop. Smoking makes the problem worse. This information is not intended to replace advice given to you by your health care provider. Make sure you discuss any questions you have with your health care provider. Document Revised: 08/20/2020 Document Reviewed: 08/21/2020 Elsevier Patient Education  2024 [employer]  

## 2023-11-04 ENCOUNTER — Ambulatory Visit: Payer: BC Managed Care – PPO | Admitting: Nurse Practitioner

## 2023-11-04 ENCOUNTER — Telehealth: Payer: Self-pay | Admitting: Internal Medicine

## 2023-11-04 ENCOUNTER — Encounter: Payer: Self-pay | Admitting: Nurse Practitioner

## 2023-11-04 VITALS — BP 134/82 | HR 76 | Temp 98.1°F | Resp 16 | Ht 60.0 in | Wt 149.2 lb

## 2023-11-04 DIAGNOSIS — G4734 Idiopathic sleep related nonobstructive alveolar hypoventilation: Secondary | ICD-10-CM

## 2023-11-04 DIAGNOSIS — I10 Essential (primary) hypertension: Secondary | ICD-10-CM | POA: Diagnosis not present

## 2023-11-04 DIAGNOSIS — J449 Chronic obstructive pulmonary disease, unspecified: Secondary | ICD-10-CM

## 2023-11-04 DIAGNOSIS — M150 Primary generalized (osteo)arthritis: Secondary | ICD-10-CM | POA: Diagnosis not present

## 2023-11-04 DIAGNOSIS — Z79899 Other long term (current) drug therapy: Secondary | ICD-10-CM

## 2023-11-04 MED ORDER — MELOXICAM 15 MG PO TABS: 15.0000 mg | ORAL_TABLET | Freq: Every day | ORAL | 3 refills | Status: DC

## 2023-11-04 MED ORDER — TRAMADOL HCL 50 MG PO TABS: 100.0000 mg | ORAL_TABLET | Freq: Two times a day (BID) | ORAL | 2 refills | Status: DC | PRN

## 2023-11-04 MED ORDER — EZETIMIBE 10 MG PO TABS: 10.0000 mg | ORAL_TABLET | Freq: Every day | ORAL | 3 refills | Status: DC

## 2023-11-04 MED ORDER — FOLIC ACID 1 MG PO TABS: 1.0000 mg | ORAL_TABLET | Freq: Every day | ORAL | 1 refills | Status: DC

## 2023-11-04 MED ORDER — GABAPENTIN 100 MG PO CAPS: 100.0000 mg | ORAL_CAPSULE | Freq: Two times a day (BID) | ORAL | 3 refills | Status: DC

## 2023-11-04 MED ORDER — ONDANSETRON 4 MG PO TBDP: ORAL_TABLET | ORAL | 0 refills | Status: DC

## 2023-11-04 MED ORDER — FAMCICLOVIR 500 MG PO TABS: ORAL_TABLET | ORAL | 1 refills | Status: DC

## 2023-11-04 NOTE — Telephone Encounter (Signed)
 Notified patient of sniff test appointment date, arrival time, location-[place]

## 2023-11-04 NOTE — Progress Notes (Signed)
 [facility] [address] [place], [place] [address]  Internal MEDICINE  Office Visit Note  Patient Name: Alexandria Murray  [id number]  [id number]  Date of Service: 11/04/2023  Chief Complaint  Patient presents with   Follow-up    HPI Alexandria Murray presents for a follow-up visit for hypertension, arthritis, COPD, nocturnal hypoxemia, and refills.  Hypertension --elevated but improved when rechecked, she is not currently on any antihypertensive medication.  Osteoarthritis -- takes tramadol  as needed for chronic arthritic pain, refills due. Also takes meloxicam  daily.  COPD -- using breztri  inhaler twice daily Nocturnal hypoxemia -- using supplemental oxygen  at night Medication refills due today   Current Medication: Outpatient Encounter Medications as of 11/04/2023  Medication Sig   Aspirin -Acetaminophen -Caffeine  (PAMPRIN MAX PO) Take 2 tablets by mouth daily.   Budeson-Glycopyrrol-Formoterol (BREZTRI  AEROSPHERE) 160-9-4.8 MCG/ACT AERO Inhale 2 puffs into the lungs 2 (two) times daily.   docusate sodium  (COLACE) 100 MG capsule Take 1 capsule (100 mg total) by mouth 2 (two) times daily.   ferrous sulfate  325 (65 FE) MG tablet Take 1 tablet (325 mg total) by mouth daily.   Multiple Vitamin (MULTIVITAMIN WITH MINERALS) TABS tablet Take 1 tablet by mouth daily.   Multiple Vitamins-Minerals (MULTIVITAMIN PO) Take 1 tablet by mouth daily.   traZODone  (DESYREL ) 100 MG tablet TAKE 1 TABLET BY MOUTH AT BEDTIME AS NEEDED FOR SLEEP   Vitamin D , Ergocalciferol , (DRISDOL ) 1.25 MG (50000 UNIT) CAPS capsule Take 1 capsule by mouth once a week   [DISCONTINUED] ezetimibe  (ZETIA ) 10 MG tablet Take 1 tablet (10 mg total) by mouth daily.   [DISCONTINUED] famciclovir  (FAMVIR ) 500 MG tablet Take one tab po bid   [DISCONTINUED] folic acid  (FOLVITE ) 1 MG tablet Take 1 tablet (1 mg total) by mouth daily.   [DISCONTINUED] gabapentin  (NEURONTIN ) 100 MG capsule Take 1 capsule (100 mg total) by mouth 2 (two)  times daily.   [DISCONTINUED] meloxicam  (MOBIC ) 15 MG tablet Take 1 tablet (15 mg total) by mouth daily.   [DISCONTINUED] ondansetron  (ZOFRAN -ODT) 4 MG disintegrating tablet DISSOLVE 1 TABLET IN MOUTH EVERY 8 HOURS AS NEEDED FOR NAUSEA FOR VOMITING   [DISCONTINUED] traMADol  (ULTRAM ) 50 MG tablet Take 2 tablets (100 mg total) by mouth every 12 (twelve) hours as needed for moderate pain.   ezetimibe  (ZETIA ) 10 MG tablet Take 1 tablet (10 mg total) by mouth daily.   famciclovir  (FAMVIR ) 500 MG tablet Take one tab po bid   folic acid  (FOLVITE ) 1 MG tablet Take 1 tablet (1 mg total) by mouth daily.   gabapentin  (NEURONTIN ) 100 MG capsule Take 1 capsule (100 mg total) by mouth 2 (two) times daily.   meloxicam  (MOBIC ) 15 MG tablet Take 1 tablet (15 mg total) by mouth daily.   ondansetron  (ZOFRAN -ODT) 4 MG disintegrating tablet DISSOLVE 1 TABLET IN MOUTH EVERY 8 HOURS AS NEEDED FOR NAUSEA FOR VOMITING   traMADol  (ULTRAM ) 50 MG tablet Take 2 tablets (100 mg total) by mouth every 12 (twelve) hours as needed for moderate pain (pain score 4-6).   No facility-administered encounter medications on file as of 11/04/2023.    Surgical History: Past Surgical History:  Procedure Laterality Date   CHOLECYSTECTOMY     COLPOSCOPY  07/26/2015   FINGER SURGERY      Medical History: Past Medical History:  Diagnosis Date   Abnormal Pap smear of cervix 06/20/2015   Anxiety    Basal cell carcinoma 09/21/2023   Left upper vermilion lip. Nodular and infiltrative pattern. Mohs  pending.   Cervical high risk human papillomavirus (HPV) DNA test positive    Dysplasia of cervix, low grade (CIN 1) 07/26/2015   Guillain-Barre syndrome (HCC)    Nicotine  dependence    Stroke [facility])    Torn rotator cuff     Family History: Family History  Problem Relation Age of Onset   Cancer Mother    Stroke Paternal Grandfather     Social History   Socioeconomic History   Marital status: Married    Spouse name: Not on file    Number of children: Not on file   Years of education: Not on file   Highest education level: Not on file  Occupational History   Not on file  Tobacco Use   Smoking status: Every Day    Current packs/day: 1.00    Types: Cigarettes   Smokeless tobacco: Never   Tobacco comments:    2 PPD  Vaping Use   Vaping status: Never Used  Substance and Sexual Activity   Alcohol use: No   Drug use: No   Sexual activity: Yes    Birth control/protection: Post-menopausal  Other Topics Concern   Not on file  Social History Narrative   Not on file   Social Drivers of Health   Financial Resource Strain: Not on file  Food Insecurity: Not on file  Transportation Needs: Not on file  Physical Activity: Not on file  Stress: Not on file  Social Connections: Not on file  Intimate Partner Violence: Not on file      Review of Systems  Constitutional:  Negative for chills, fatigue and unexpected weight change.  HENT:  Negative for congestion, rhinorrhea, sneezing and sore throat.   Respiratory:  Positive for cough. Negative for chest tightness, shortness of breath and wheezing.   Cardiovascular: Negative.  Negative for chest pain and palpitations.  Gastrointestinal:  Negative for abdominal pain, constipation, diarrhea, nausea and vomiting.  Musculoskeletal:  Positive for arthralgias (takes meloxicam  daily and tramadol  BID prn). Negative for back pain, joint swelling and neck pain.  Neurological: Negative.   Psychiatric/Behavioral:  Positive for sleep disturbance (takes trazodone ). Negative for behavioral problems (Depression) and suicidal ideas. The patient is nervous/anxious (improved, stopped lexapro ).     Vital Signs: BP 134/82   Pulse 76   Temp 98.1 F (36.7 C)   Resp 16   Ht 5' (1.524 m)   Wt 149 lb 3.2 oz (67.7 kg)   LMP 02/02/2018 (Approximate)   SpO2 95%   BMI 29.14 kg/m    Physical Exam Vitals reviewed.  Constitutional:      General: She is not in acute distress.     Appearance: Normal appearance. She is obese. She is not ill-appearing.  HENT:     Head: Normocephalic and atraumatic.  Eyes:     Pupils: Pupils are equal, round, and reactive to light.  Cardiovascular:     Rate and Rhythm: Normal rate and regular rhythm.  Pulmonary:     Effort: Pulmonary effort is normal. No respiratory distress.  Musculoskeletal:     Comments: Wearing compression sleeve on right arm  Neurological:     Mental Status: She is alert and oriented to person, place, and time.  Psychiatric:        Mood and Affect: Mood normal.        Behavior: Behavior normal.       Assessment/Plan: 1. Essential hypertension (Primary) Stable, not currently on any medications for high blood pressure. Continue to monitor periodically.  2. Chronic obstructive pulmonary disease, unspecified COPD type (HCC) Continue using breztri  inhaler as prescribed.   3. Nocturnal hypoxemia Continue using supplemental oxygen  at night as instructed.   4. Primary generalized (osteo)arthritis Continue tramadol  as needed as prescribed. Continue meloxicam  as prescribed.  - traMADol  (ULTRAM ) 50 MG tablet; Take 2 tablets (100 mg total) by mouth every 12 (twelve) hours as needed for moderate pain (pain score 4-6).  Dispense: 120 tablet; Refill: 2 - meloxicam  (MOBIC ) 15 MG tablet; Take 1 tablet (15 mg total) by mouth daily.  Dispense: 90 tablet; Refill: 3  5. Encounter for medication review Medication list reviewed, updated and refills ordered  - ondansetron  (ZOFRAN -ODT) 4 MG disintegrating tablet; DISSOLVE 1 TABLET IN MOUTH EVERY 8 HOURS AS NEEDED FOR NAUSEA FOR VOMITING  Dispense: 30 tablet; Refill: 0 - ezetimibe  (ZETIA ) 10 MG tablet; Take 1 tablet (10 mg total) by mouth daily.  Dispense: 90 tablet; Refill: 3 - folic acid  (FOLVITE ) 1 MG tablet; Take 1 tablet (1 mg total) by mouth daily.  Dispense: 90 tablet; Refill: 1 - gabapentin  (NEURONTIN ) 100 MG capsule; Take 1 capsule (100 mg total) by mouth 2 (two)  times daily.  Dispense: 180 capsule; Refill: 3 - famciclovir  (FAMVIR ) 500 MG tablet; Take one tab po bid  Dispense: 60 tablet; Refill: 1   General Counseling: Alexandria Murray verbalizes understanding of the findings of todays visit and agrees with plan of treatment. I have discussed any further diagnostic evaluation that may be needed or ordered today. We also reviewed her medications today. she has been encouraged to call the office with any questions or concerns that should arise related to todays visit.    No orders of the defined types were placed in this encounter.   Meds ordered this encounter  Medications   traMADol  (ULTRAM ) 50 MG tablet    Sig: Take 2 tablets (100 mg total) by mouth every 12 (twelve) hours as needed for moderate pain (pain score 4-6).    Dispense:  120 tablet    Refill:  2    For future refills   ondansetron  (ZOFRAN -ODT) 4 MG disintegrating tablet    Sig: DISSOLVE 1 TABLET IN MOUTH EVERY 8 HOURS AS NEEDED FOR NAUSEA FOR VOMITING    Dispense:  30 tablet    Refill:  0    Fill script today   ezetimibe  (ZETIA ) 10 MG tablet    Sig: Take 1 tablet (10 mg total) by mouth daily.    Dispense:  90 tablet    Refill:  3    High cholesterol and history of stroke. Statins have caused excess joint and muscle pain.   folic acid  (FOLVITE ) 1 MG tablet    Sig: Take 1 tablet (1 mg total) by mouth daily.    Dispense:  90 tablet    Refill:  1   gabapentin  (NEURONTIN ) 100 MG capsule    Sig: Take 1 capsule (100 mg total) by mouth 2 (two) times daily.    Dispense:  180 capsule    Refill:  3   meloxicam  (MOBIC ) 15 MG tablet    Sig: Take 1 tablet (15 mg total) by mouth daily.    Dispense:  90 tablet    Refill:  3   famciclovir  (FAMVIR ) 500 MG tablet    Sig: Take one tab po bid    Dispense:  60 tablet    Refill:  1    Return for previously scheduled, CPE, Kiaja Shorty PCP in april.   Total time spent:30 Minutes  Time spent includes review of chart, medications, test results, and follow up  plan with the patient.   [place] Controlled Substance Database was reviewed by me.  This patient was seen by Mardy Maxin, FNP-C in collaboration with Dr. Sigrid Bathe as a part of collaborative care agreement.   Yaser Harvill R. Maxin, MSN, FNP-C Internal medicine

## 2023-11-04 NOTE — Progress Notes (Deleted)
 [facility] [address] [place], [place] [address]  Internal MEDICINE  Office Visit Note  Patient Name: Alexandria Murray  [id number]  [id number]  Date of Service: 11/04/2023  Chief Complaint  Patient presents with   Follow-up    HPI Alexandria Murray presents for a follow-up visit for      Current Medication: Outpatient Encounter Medications as of 11/04/2023  Medication Sig   Aspirin -Acetaminophen -Caffeine  (PAMPRIN MAX PO) Take 2 tablets by mouth daily.   Budeson-Glycopyrrol-Formoterol (BREZTRI  AEROSPHERE) 160-9-4.8 MCG/ACT AERO Inhale 2 puffs into the lungs 2 (two) times daily.   docusate sodium  (COLACE) 100 MG capsule Take 1 capsule (100 mg total) by mouth 2 (two) times daily.   ferrous sulfate  325 (65 FE) MG tablet Take 1 tablet (325 mg total) by mouth daily.   Multiple Vitamin (MULTIVITAMIN WITH MINERALS) TABS tablet Take 1 tablet by mouth daily.   Multiple Vitamins-Minerals (MULTIVITAMIN PO) Take 1 tablet by mouth daily.   traZODone  (DESYREL ) 100 MG tablet TAKE 1 TABLET BY MOUTH AT BEDTIME AS NEEDED FOR SLEEP   Vitamin D , Ergocalciferol , (DRISDOL ) 1.25 MG (50000 UNIT) CAPS capsule Take 1 capsule by mouth once a week   [DISCONTINUED] ezetimibe  (ZETIA ) 10 MG tablet Take 1 tablet (10 mg total) by mouth daily.   [DISCONTINUED] famciclovir  (FAMVIR ) 500 MG tablet Take one tab po bid   [DISCONTINUED] folic acid  (FOLVITE ) 1 MG tablet Take 1 tablet (1 mg total) by mouth daily.   [DISCONTINUED] gabapentin  (NEURONTIN ) 100 MG capsule Take 1 capsule (100 mg total) by mouth 2 (two) times daily.   [DISCONTINUED] meloxicam  (MOBIC ) 15 MG tablet Take 1 tablet (15 mg total) by mouth daily.   [DISCONTINUED] ondansetron  (ZOFRAN -ODT) 4 MG disintegrating tablet DISSOLVE 1 TABLET IN MOUTH EVERY 8 HOURS AS NEEDED FOR NAUSEA FOR VOMITING   [DISCONTINUED] traMADol  (ULTRAM ) 50 MG tablet Take 2 tablets (100 mg total) by mouth every 12 (twelve) hours as needed for moderate pain.   ezetimibe  (ZETIA ) 10 MG  tablet Take 1 tablet (10 mg total) by mouth daily.   famciclovir  (FAMVIR ) 500 MG tablet Take one tab po bid   folic acid  (FOLVITE ) 1 MG tablet Take 1 tablet (1 mg total) by mouth daily.   gabapentin  (NEURONTIN ) 100 MG capsule Take 1 capsule (100 mg total) by mouth 2 (two) times daily.   meloxicam  (MOBIC ) 15 MG tablet Take 1 tablet (15 mg total) by mouth daily.   ondansetron  (ZOFRAN -ODT) 4 MG disintegrating tablet DISSOLVE 1 TABLET IN MOUTH EVERY 8 HOURS AS NEEDED FOR NAUSEA FOR VOMITING   traMADol  (ULTRAM ) 50 MG tablet Take 2 tablets (100 mg total) by mouth every 12 (twelve) hours as needed for moderate pain (pain score 4-6).   No facility-administered encounter medications on file as of 11/04/2023.    Surgical History: Past Surgical History:  Procedure Laterality Date   CHOLECYSTECTOMY     COLPOSCOPY  07/26/2015   FINGER SURGERY      Medical History: Past Medical History:  Diagnosis Date   Abnormal Pap smear of cervix 06/20/2015   Anxiety    Basal cell carcinoma 09/21/2023   Left upper vermilion lip. Nodular and infiltrative pattern. Mohs pending.   Cervical high risk human papillomavirus (HPV) DNA test positive    Dysplasia of cervix, low grade (CIN 1) 07/26/2015   Guillain-Barre syndrome (HCC)    Nicotine  dependence    Stroke [facility])    Torn rotator cuff     Family History: Family History  Problem Relation Age  of Onset   Cancer Mother    Stroke Paternal Grandfather     Social History   Socioeconomic History   Marital status: Married    Spouse name: Not on file   Number of children: Not on file   Years of education: Not on file   Highest education level: Not on file  Occupational History   Not on file  Tobacco Use   Smoking status: Every Day    Current packs/day: 1.00    Types: Cigarettes   Smokeless tobacco: Never   Tobacco comments:    2 PPD  Vaping Use   Vaping status: Never Used  Substance and Sexual Activity   Alcohol use: No   Drug use: No   Sexual  activity: Yes    Birth control/protection: Post-menopausal  Other Topics Concern   Not on file  Social History Narrative   Not on file   Social Drivers of Health   Financial Resource Strain: Not on file  Food Insecurity: Not on file  Transportation Needs: Not on file  Physical Activity: Not on file  Stress: Not on file  Social Connections: Not on file  Intimate Partner Violence: Not on file      Review of Systems  Vital Signs: BP 134/82   Pulse 76   Temp 98.1 F (36.7 C)   Resp 16   Ht 5' (1.524 m)   Wt 149 lb 3.2 oz (67.7 kg)   LMP 02/02/2018 (Approximate)   SpO2 95%   BMI 29.14 kg/m    Physical Exam     Assessment/Plan:   General Counseling: Alexandria Murray verbalizes understanding of the findings of todays visit and agrees with plan of treatment. I have discussed any further diagnostic evaluation that may be needed or ordered today. We also reviewed her medications today. she has been encouraged to call the office with any questions or concerns that should arise related to todays visit.    No orders of the defined types were placed in this encounter.   Meds ordered this encounter  Medications   traMADol  (ULTRAM ) 50 MG tablet    Sig: Take 2 tablets (100 mg total) by mouth every 12 (twelve) hours as needed for moderate pain (pain score 4-6).    Dispense:  120 tablet    Refill:  2    For future refills   ondansetron  (ZOFRAN -ODT) 4 MG disintegrating tablet    Sig: DISSOLVE 1 TABLET IN MOUTH EVERY 8 HOURS AS NEEDED FOR NAUSEA FOR VOMITING    Dispense:  30 tablet    Refill:  0    Fill script today   ezetimibe  (ZETIA ) 10 MG tablet    Sig: Take 1 tablet (10 mg total) by mouth daily.    Dispense:  90 tablet    Refill:  3    High cholesterol and history of stroke. Statins have caused excess joint and muscle pain.   folic acid  (FOLVITE ) 1 MG tablet    Sig: Take 1 tablet (1 mg total) by mouth daily.    Dispense:  90 tablet    Refill:  1   gabapentin  (NEURONTIN ) 100  MG capsule    Sig: Take 1 capsule (100 mg total) by mouth 2 (two) times daily.    Dispense:  180 capsule    Refill:  3   meloxicam  (MOBIC ) 15 MG tablet    Sig: Take 1 tablet (15 mg total) by mouth daily.    Dispense:  90 tablet    Refill:  3   famciclovir  (FAMVIR ) 500 MG tablet    Sig: Take one tab po bid    Dispense:  60 tablet    Refill:  1    No follow-ups on file.   Total time spent:*** Minutes Time spent includes review of chart, medications, test results, and follow up plan with the patient.   [place] Controlled Substance Database was reviewed by me.  This patient was seen by Mardy Maxin, FNP-C in collaboration with Dr. Sigrid Bathe as a part of collaborative care agreement.   Dashawn Golda R. Maxin, MSN, FNP-C Internal medicine

## 2023-11-09 ENCOUNTER — Ambulatory Visit
Admission: RE | Admit: 2023-11-09 | Discharge: 2023-11-09 | Disposition: A | Discharge status: Home / Self Care | Payer: BC Managed Care – PPO | Source: Ambulatory Visit | Attending: Internal Medicine | Admitting: Internal Medicine

## 2023-11-09 DIAGNOSIS — G610 Guillain-Barre syndrome: Secondary | ICD-10-CM | POA: Diagnosis not present

## 2023-11-09 DIAGNOSIS — D751 Secondary polycythemia: Secondary | ICD-10-CM | POA: Diagnosis not present

## 2023-11-09 DIAGNOSIS — G4734 Idiopathic sleep related nonobstructive alveolar hypoventilation: Secondary | ICD-10-CM | POA: Diagnosis not present

## 2023-11-09 DIAGNOSIS — Z8669 Personal history of other diseases of the nervous system and sense organs: Secondary | ICD-10-CM | POA: Diagnosis not present

## 2023-11-09 DIAGNOSIS — J449 Chronic obstructive pulmonary disease, unspecified: Secondary | ICD-10-CM | POA: Diagnosis not present

## 2023-11-10 LAB — CBC WITH DIFFERENTIAL/PLATELET
Basophils Absolute: 0.1 10*3/uL (ref 0.0–0.2)
Basos: 1 %
EOS (ABSOLUTE): 0.1 10*3/uL (ref 0.0–0.4)
Eos: 1 %
Hematocrit: 47.3 % — ABNORMAL HIGH (ref 34.0–46.6)
Hemoglobin: 15.6 g/dL (ref 11.1–15.9)
Immature Grans (Abs): 0 10*3/uL (ref 0.0–0.1)
Immature Granulocytes: 0 %
Lymphocytes Absolute: 2.3 10*3/uL (ref 0.7–3.1)
Lymphs: 22 %
MCH: 31.5 pg (ref 26.6–33.0)
MCHC: 33 g/dL (ref 31.5–35.7)
MCV: 95 fL (ref 79–97)
Monocytes Absolute: 0.6 10*3/uL (ref 0.1–0.9)
Monocytes: 6 %
Neutrophils Absolute: 7.5 10*3/uL — ABNORMAL HIGH (ref 1.4–7.0)
Neutrophils: 70 %
Platelets: 281 10*3/uL (ref 150–450)
RBC: 4.96 x10E6/uL (ref 3.77–5.28)
RDW: 12.8 % (ref 11.7–15.4)
WBC: 10.6 10*3/uL (ref 3.4–10.8)

## 2023-12-01 DIAGNOSIS — C4401 Basal cell carcinoma of skin of lip: Secondary | ICD-10-CM | POA: Diagnosis not present

## 2023-12-03 DIAGNOSIS — R0902 Hypoxemia: Secondary | ICD-10-CM | POA: Diagnosis not present

## 2023-12-10 ENCOUNTER — Other Ambulatory Visit: Payer: Self-pay | Admitting: Nurse Practitioner

## 2023-12-10 DIAGNOSIS — Z79899 Other long term (current) drug therapy: Secondary | ICD-10-CM

## 2023-12-14 ENCOUNTER — Ambulatory Visit: Payer: BC Managed Care – PPO | Admitting: Internal Medicine

## 2023-12-23 ENCOUNTER — Other Ambulatory Visit: Payer: Self-pay | Admitting: Nurse Practitioner

## 2023-12-23 DIAGNOSIS — E559 Vitamin D deficiency, unspecified: Secondary | ICD-10-CM

## 2023-12-31 DIAGNOSIS — R0902 Hypoxemia: Secondary | ICD-10-CM | POA: Diagnosis not present

## 2024-02-09 ENCOUNTER — Other Ambulatory Visit: Payer: Self-pay | Admitting: Nurse Practitioner

## 2024-02-09 DIAGNOSIS — M150 Primary generalized (osteo)arthritis: Secondary | ICD-10-CM

## 2024-02-09 NOTE — Telephone Encounter (Signed)
 Please review

## 2024-02-17 ENCOUNTER — Encounter: Payer: Self-pay | Admitting: Nurse Practitioner

## 2024-02-17 ENCOUNTER — Ambulatory Visit (INDEPENDENT_AMBULATORY_CARE_PROVIDER_SITE_OTHER): Payer: BC Managed Care – PPO | Admitting: Nurse Practitioner

## 2024-02-17 VITALS — BP 136/88 | HR 77 | Temp 98.0°F | Resp 16 | Ht 60.0 in | Wt 143.6 lb

## 2024-02-17 DIAGNOSIS — E538 Deficiency of other specified B group vitamins: Secondary | ICD-10-CM

## 2024-02-17 DIAGNOSIS — I10 Essential (primary) hypertension: Secondary | ICD-10-CM | POA: Diagnosis not present

## 2024-02-17 DIAGNOSIS — D509 Iron deficiency anemia, unspecified: Secondary | ICD-10-CM

## 2024-02-17 DIAGNOSIS — Z1212 Encounter for screening for malignant neoplasm of rectum: Secondary | ICD-10-CM

## 2024-02-17 DIAGNOSIS — Z1211 Encounter for screening for malignant neoplasm of colon: Secondary | ICD-10-CM

## 2024-02-17 DIAGNOSIS — Z0001 Encounter for general adult medical examination with abnormal findings: Secondary | ICD-10-CM

## 2024-02-17 DIAGNOSIS — G4734 Idiopathic sleep related nonobstructive alveolar hypoventilation: Secondary | ICD-10-CM

## 2024-02-17 DIAGNOSIS — M150 Primary generalized (osteo)arthritis: Secondary | ICD-10-CM

## 2024-02-17 DIAGNOSIS — F5101 Primary insomnia: Secondary | ICD-10-CM

## 2024-02-17 DIAGNOSIS — I700 Atherosclerosis of aorta: Secondary | ICD-10-CM | POA: Diagnosis not present

## 2024-02-17 DIAGNOSIS — E559 Vitamin D deficiency, unspecified: Secondary | ICD-10-CM

## 2024-02-17 DIAGNOSIS — Z1231 Encounter for screening mammogram for malignant neoplasm of breast: Secondary | ICD-10-CM

## 2024-02-17 DIAGNOSIS — Z8669 Personal history of other diseases of the nervous system and sense organs: Secondary | ICD-10-CM

## 2024-02-17 MED ORDER — TRAMADOL HCL 50 MG PO TABS: 100.0000 mg | ORAL_TABLET | Freq: Two times a day (BID) | ORAL | 2 refills | Status: DC | PRN
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
## 2024-03-01 DIAGNOSIS — R0902 Hypoxemia: Secondary | ICD-10-CM | POA: Diagnosis not present

## 2024-04-01 DIAGNOSIS — R0902 Hypoxemia: Secondary | ICD-10-CM | POA: Diagnosis not present

## 2024-05-01 DIAGNOSIS — R0902 Hypoxemia: Secondary | ICD-10-CM | POA: Diagnosis not present

## 2024-05-11 ENCOUNTER — Ambulatory Visit: Admitting: Nurse Practitioner

## 2024-05-11 ENCOUNTER — Encounter: Payer: Self-pay | Admitting: Nurse Practitioner

## 2024-05-11 VITALS — BP 138/76 | HR 97 | Temp 98.2°F | Resp 16 | Ht 60.0 in | Wt 143.6 lb

## 2024-05-11 DIAGNOSIS — G6181 Chronic inflammatory demyelinating polyneuritis: Secondary | ICD-10-CM | POA: Diagnosis not present

## 2024-05-11 DIAGNOSIS — I10 Essential (primary) hypertension: Secondary | ICD-10-CM | POA: Diagnosis not present

## 2024-05-11 DIAGNOSIS — M150 Primary generalized (osteo)arthritis: Secondary | ICD-10-CM | POA: Diagnosis not present

## 2024-05-11 DIAGNOSIS — Z124 Encounter for screening for malignant neoplasm of cervix: Secondary | ICD-10-CM | POA: Diagnosis not present

## 2024-05-11 DIAGNOSIS — Z8669 Personal history of other diseases of the nervous system and sense organs: Secondary | ICD-10-CM

## 2024-05-11 DIAGNOSIS — D509 Iron deficiency anemia, unspecified: Secondary | ICD-10-CM | POA: Diagnosis not present

## 2024-05-11 DIAGNOSIS — E559 Vitamin D deficiency, unspecified: Secondary | ICD-10-CM | POA: Diagnosis not present

## 2024-05-11 DIAGNOSIS — Z0001 Encounter for general adult medical examination with abnormal findings: Secondary | ICD-10-CM | POA: Diagnosis not present

## 2024-05-11 DIAGNOSIS — Z1211 Encounter for screening for malignant neoplasm of colon: Secondary | ICD-10-CM | POA: Diagnosis not present

## 2024-05-11 DIAGNOSIS — E538 Deficiency of other specified B group vitamins: Secondary | ICD-10-CM | POA: Diagnosis not present

## 2024-05-11 DIAGNOSIS — I700 Atherosclerosis of aorta: Secondary | ICD-10-CM | POA: Diagnosis not present

## 2024-05-11 MED ORDER — TRAMADOL HCL 50 MG PO TABS: 100.0000 mg | ORAL_TABLET | Freq: Two times a day (BID) | ORAL | 2 refills | Status: DC | PRN

## 2024-05-11 NOTE — Progress Notes (Signed)
 [facility] [address] [place], [place] [address]  Internal MEDICINE  Office Visit Note  Patient Name: Alexandria Murray  [id number]  [id number]  Date of Service: 05/11/2024  Chief Complaint  Patient presents with   Follow-up    HPI Tykera presents for a follow-up visit for pap smear, chronic pain and neuropathy.  Reminded patient to get labs done Pap smear due today Chronic pain and history of guillain barre syndrome -- takes tramadol  as needed, due for refills.  Previously diagnosed with CIPD by neurology with history of guillain barre syndrome. Interested in a newer specialty medication called Vyvgart Hytrulo. Wants to see neurology again to see if she is a candidate for this medication.    Current Medication: Outpatient Encounter Medications as of 05/11/2024  Medication Sig   Aspirin -Acetaminophen -Caffeine  (PAMPRIN MAX PO) Take 2 tablets by mouth daily.   Budeson-Glycopyrrol-Formoterol (BREZTRI  AEROSPHERE) 160-9-4.8 MCG/ACT AERO Inhale 2 puffs into the lungs 2 (two) times daily.   docusate sodium  (COLACE) 100 MG capsule Take 1 capsule (100 mg total) by mouth 2 (two) times daily.   ezetimibe  (ZETIA ) 10 MG tablet Take 1 tablet (10 mg total) by mouth daily.   ferrous sulfate  325 (65 FE) MG tablet Take 1 tablet (325 mg total) by mouth daily.   folic acid  (FOLVITE ) 1 MG tablet Take 1 tablet (1 mg total) by mouth daily.   gabapentin  (NEURONTIN ) 100 MG capsule Take 1 capsule (100 mg total) by mouth 2 (two) times daily.   meloxicam  (MOBIC ) 15 MG tablet Take 1 tablet (15 mg total) by mouth daily.   Multiple Vitamin (MULTIVITAMIN WITH MINERALS) TABS tablet Take 1 tablet by mouth daily.   Multiple Vitamins-Minerals (MULTIVITAMIN PO) Take 1 tablet by mouth daily.   ondansetron  (ZOFRAN -ODT) 4 MG disintegrating tablet DISSOLVE 1 TABLET IN MOUTH EVERY 8 HOURS AS NEEDED FOR NAUSEA OR VOMITING   traMADol  (ULTRAM ) 50 MG tablet Take 2 tablets (100 mg total) by mouth every 12 (twelve)  hours as needed for moderate pain (pain score 4-6).   Vitamin D , Ergocalciferol , (DRISDOL ) 1.25 MG (50000 UNIT) CAPS capsule Take 1 capsule by mouth once a week   [DISCONTINUED] traMADol  (ULTRAM ) 50 MG tablet Take 2 tablets (100 mg total) by mouth every 12 (twelve) hours as needed for moderate pain (pain score 4-6).   No facility-administered encounter medications on file as of 05/11/2024.    Surgical History: Past Surgical History:  Procedure Laterality Date   CHOLECYSTECTOMY     COLPOSCOPY  07/26/2015   FINGER SURGERY      Medical History: Past Medical History:  Diagnosis Date   Abnormal Pap smear of cervix 06/20/2015   Anxiety    Basal cell carcinoma 09/21/2023   Left upper vermilion lip. Nodular and infiltrative pattern. Mohs 12/01/23.   Cervical high risk human papillomavirus (HPV) DNA test positive    Dysplasia of cervix, low grade (CIN 1) 07/26/2015   Guillain-Barre syndrome (HCC)    Nicotine  dependence    Stroke [facility])    Torn rotator cuff     Family History: Family History  Problem Relation Age of Onset   Cancer Mother    Stroke Paternal Grandfather     Social History   Socioeconomic History   Marital status: Married    Spouse name: Not on file   Number of children: Not on file   Years of education: Not on file   Highest education level: Not on file  Occupational History   Not on file  Tobacco Use   Smoking status: Every Day    Current packs/day: 1.00    Types: Cigarettes   Smokeless tobacco: Never   Tobacco comments:    2 PPD  Vaping Use   Vaping status: Never Used  Substance and Sexual Activity   Alcohol use: No   Drug use: No   Sexual activity: Yes    Birth control/protection: Post-menopausal  Other Topics Concern   Not on file  Social History Narrative   Not on file   Social Drivers of Health   Financial Resource Strain: Not on file  Food Insecurity: Not on file  Transportation Needs: Not on file  Physical Activity: Not on file  Stress:  Not on file  Social Connections: Not on file  Intimate Partner Violence: Not on file      Review of Systems  Constitutional:  Negative for chills, fatigue and unexpected weight change.  HENT:  Negative for congestion, rhinorrhea, sneezing and sore throat.   Respiratory:  Positive for cough. Negative for chest tightness, shortness of breath and wheezing.   Cardiovascular: Negative.  Negative for chest pain and palpitations.  Gastrointestinal:  Negative for abdominal pain, constipation, diarrhea, nausea and vomiting.  Genitourinary: Negative.   Musculoskeletal:  Positive for arthralgias (takes meloxicam  daily and tramadol  BID prn). Negative for back pain, joint swelling and neck pain.  Neurological: Negative.   Psychiatric/Behavioral:  Positive for sleep disturbance (takes trazodone ). Negative for behavioral problems (Depression) and suicidal ideas. The patient is nervous/anxious (improved, stopped lexapro ).     Vital Signs: BP 138/76   Pulse 97   Temp 98.2 F (36.8 C)   Resp 16   Ht 5' (1.524 m)   Wt 143 lb 9.6 oz (65.1 kg)   LMP 02/02/2018 (Approximate)   SpO2 99%   BMI 28.04 kg/m    Physical Exam Vitals reviewed.  Constitutional:      General: She is not in acute distress.    Appearance: Normal appearance. She is normal weight. She is not ill-appearing.  HENT:     Head: Normocephalic and atraumatic.  Eyes:     Pupils: Pupils are equal, round, and reactive to light.  Cardiovascular:     Rate and Rhythm: Normal rate and regular rhythm.  Pulmonary:     Effort: Pulmonary effort is normal. No respiratory distress.  Abdominal:     Hernia: There is no hernia in the left inguinal area or right inguinal area.  Genitourinary:    General: Normal vulva.     Exam position: Lithotomy position.     Pubic Area: No rash or pubic lice.      Labia:        Right: No rash, tenderness, lesion or injury.        Left: No rash, tenderness, lesion or injury.      Urethra: No prolapse,  urethral pain, urethral swelling or urethral lesion.     Vagina: Normal. No signs of injury and foreign body. No vaginal discharge, erythema, tenderness, bleeding, lesions or prolapsed vaginal walls.     Cervix: Normal. No cervical motion tenderness, discharge, friability, lesion, erythema, cervical bleeding or eversion.     Uterus: Normal. Not deviated, not enlarged, not fixed, not tender and no uterine prolapse.      Adnexa: Right adnexa normal and left adnexa normal.       Right: No mass, tenderness or fullness.         Left: No mass, tenderness or fullness.  Rectum: Normal. No mass, tenderness, anal fissure or external hemorrhoid.  Lymphadenopathy:     Lower Body: No right inguinal adenopathy. No left inguinal adenopathy.  Neurological:     Mental Status: She is alert and oriented to person, place, and time.  Psychiatric:        Mood and Affect: Mood normal.        Behavior: Behavior normal.        Assessment/Plan: 1. Primary generalized (osteo)arthritis (Primary) Continue tramadol  as prescribed. Follow up in 3 months for additional refills.  - traMADol  (ULTRAM ) 50 MG tablet; Take 2 tablets (100 mg total) by mouth every 12 (twelve) hours as needed for moderate pain (pain score 4-6).  Dispense: 120 tablet; Refill: 2  2. CIDP (chronic inflammatory demyelinating polyneuropathy) (HCC) Referred to neurology - Ambulatory referral to Neurology  3. History of Guillain-Barre syndrome Referred to neurology - traMADol  (ULTRAM ) 50 MG tablet; Take 2 tablets (100 mg total) by mouth every 12 (twelve) hours as needed for moderate pain (pain score 4-6).  Dispense: 120 tablet; Refill: 2 - Ambulatory referral to Neurology  4. Routine Papanicolaou smear Normal pelvic exam done with routine pap smear.  - IGP, Aptima HPV   General Counseling: Dejanay verbalizes understanding of the findings of todays visit and agrees with plan of treatment. I have discussed any further diagnostic evaluation  that may be needed or ordered today. We also reviewed her medications today. she has been encouraged to call the office with any questions or concerns that should arise related to todays visit.    Orders Placed This Encounter  Procedures   Ambulatory referral to Neurology    Meds ordered this encounter  Medications   traMADol  (ULTRAM ) 50 MG tablet    Sig: Take 2 tablets (100 mg total) by mouth every 12 (twelve) hours as needed for moderate pain (pain score 4-6).    Dispense:  120 tablet    Refill:  2    Additional refills will be sent at her appt this week    Return in about 3 months (around 08/04/2024) for F/U, pain med refill, Anacarolina Evelyn PCP.   Total time spent:30 Minutes Time spent includes review of chart, medications, test results, and follow up plan with the patient.   [place] Controlled Substance Database was reviewed by me.  This patient was seen by Mardy Maxin, FNP-C in collaboration with Dr. Sigrid Bathe as a part of collaborative care agreement.   Demarko Zeimet R. Maxin, MSN, FNP-C Internal medicine

## 2024-05-12 ENCOUNTER — Telehealth: Payer: Self-pay | Admitting: Nurse Practitioner

## 2024-05-12 LAB — IRON,TIBC AND FERRITIN PANEL
Ferritin: 31 ng/mL (ref 15–150)
Iron Saturation: 30 % (ref 15–55)
Iron: 123 ug/dL (ref 27–159)
Total Iron Binding Capacity: 416 ug/dL (ref 250–450)
UIBC: 293 ug/dL (ref 131–425)

## 2024-05-12 LAB — CMP14+EGFR
ALT: 19 IU/L (ref 0–32)
AST: 22 IU/L (ref 0–40)
Albumin: 4.5 g/dL (ref 3.9–4.9)
Alkaline Phosphatase: 112 IU/L (ref 44–121)
BUN/Creatinine Ratio: 23 (ref 9–23)
BUN: 26 mg/dL — ABNORMAL HIGH (ref 6–24)
Bilirubin Total: 0.4 mg/dL (ref 0.0–1.2)
CO2: 19 mmol/L — ABNORMAL LOW (ref 20–29)
Calcium: 9.6 mg/dL (ref 8.7–10.2)
Chloride: 102 mmol/L (ref 96–106)
Creatinine, Ser: 1.15 mg/dL — ABNORMAL HIGH (ref 0.57–1.00)
Globulin, Total: 2.3 g/dL (ref 1.5–4.5)
Glucose: 103 mg/dL — ABNORMAL HIGH (ref 70–99)
Potassium: 4.7 mmol/L (ref 3.5–5.2)
Sodium: 137 mmol/L (ref 134–144)
Total Protein: 6.8 g/dL (ref 6.0–8.5)
eGFR: 58 mL/min/1.73 — ABNORMAL LOW (ref >59)

## 2024-05-12 LAB — VITAMIN D 25 HYDROXY (VIT D DEFICIENCY, FRACTURES): Vit D, 25-Hydroxy: 13.2 ng/mL — ABNORMAL LOW (ref 30.0–100.0)

## 2024-05-12 LAB — CBC WITH DIFFERENTIAL/PLATELET
Basophils Absolute: 0.1 x10E3/uL (ref 0.0–0.2)
Basos: 1 %
EOS (ABSOLUTE): 0.1 x10E3/uL (ref 0.0–0.4)
Eos: 1 %
Hematocrit: 51.1 % — ABNORMAL HIGH (ref 34.0–46.6)
Hemoglobin: 17 g/dL — ABNORMAL HIGH (ref 11.1–15.9)
Immature Grans (Abs): 0 x10E3/uL (ref 0.0–0.1)
Immature Granulocytes: 0 %
Lymphocytes Absolute: 2.4 x10E3/uL (ref 0.7–3.1)
Lymphs: 23 %
MCH: 31.5 pg (ref 26.6–33.0)
MCHC: 33.3 g/dL (ref 31.5–35.7)
MCV: 95 fL (ref 79–97)
Monocytes Absolute: 0.7 x10E3/uL (ref 0.1–0.9)
Monocytes: 6 %
Neutrophils Absolute: 7.3 x10E3/uL — ABNORMAL HIGH (ref 1.4–7.0)
Neutrophils: 69 %
Platelets: 272 x10E3/uL (ref 150–450)
RBC: 5.39 x10E6/uL — ABNORMAL HIGH (ref 3.77–5.28)
RDW: 13 % (ref 11.7–15.4)
WBC: 10.6 x10E3/uL (ref 3.4–10.8)

## 2024-05-12 LAB — TSH+FREE T4
Free T4: 1.24 ng/dL (ref 0.82–1.77)
TSH: 3.59 u[IU]/mL (ref 0.450–4.500)

## 2024-05-12 LAB — LIPID PANEL
Chol/HDL Ratio: 4.6 ratio — ABNORMAL HIGH (ref 0.0–4.4)
Cholesterol, Total: 224 mg/dL — ABNORMAL HIGH (ref 100–199)
HDL: 49 mg/dL (ref >39)
LDL Chol Calc (NIH): 140 mg/dL — ABNORMAL HIGH (ref 0–99)
Triglycerides: 193 mg/dL — ABNORMAL HIGH (ref 0–149)
VLDL Cholesterol Cal: 35 mg/dL (ref 5–40)

## 2024-05-12 LAB — B12 AND FOLATE PANEL
Folate: 7.4 ng/mL (ref >3.0)
Vitamin B-12: 410 pg/mL (ref 232–1245)

## 2024-05-12 NOTE — Telephone Encounter (Signed)
 Awaiting 05/11/24 office notes for Neurology referral-[facility]

## 2024-05-15 LAB — IGP, APTIMA HPV: HPV Aptima: POSITIVE — AB

## 2024-05-20 ENCOUNTER — Other Ambulatory Visit: Payer: Self-pay | Admitting: Nurse Practitioner

## 2024-05-20 DIAGNOSIS — Z79899 Other long term (current) drug therapy: Secondary | ICD-10-CM

## 2024-06-07 ENCOUNTER — Telehealth: Payer: Self-pay

## 2024-06-07 NOTE — Telephone Encounter (Signed)
 Lmom that  her cologuard order is still active please call us  back that she received kit

## 2024-06-10 ENCOUNTER — Encounter: Payer: Self-pay | Admitting: Nurse Practitioner

## 2024-06-14 ENCOUNTER — Telehealth: Payer: Self-pay | Admitting: Nurse Practitioner

## 2024-06-14 NOTE — Telephone Encounter (Signed)
 Neurology referral sent via [employer] to Dr. Lane w/ [facility]. Lvm notifying patient. Gave telephone # [phone]

## 2024-06-15 ENCOUNTER — Telehealth: Payer: Self-pay | Admitting: Nurse Practitioner

## 2024-06-15 NOTE — Telephone Encounter (Signed)
 Neurology appointment 08/16/2024 @ [facility]

## 2024-06-29 ENCOUNTER — Encounter: Payer: Self-pay | Admitting: Nurse Practitioner

## 2024-07-02 DIAGNOSIS — R0902 Hypoxemia: Secondary | ICD-10-CM | POA: Diagnosis not present

## 2024-07-19 ENCOUNTER — Other Ambulatory Visit: Payer: Self-pay | Admitting: Nurse Practitioner

## 2024-07-19 DIAGNOSIS — Z79899 Other long term (current) drug therapy: Secondary | ICD-10-CM

## 2024-08-01 DIAGNOSIS — R0902 Hypoxemia: Secondary | ICD-10-CM | POA: Diagnosis not present

## 2024-08-04 ENCOUNTER — Encounter: Payer: Self-pay | Admitting: Nurse Practitioner

## 2024-08-04 ENCOUNTER — Ambulatory Visit: Admitting: Nurse Practitioner

## 2024-08-04 VITALS — BP 138/88 | HR 98 | Temp 97.2°F | Resp 16 | Ht 60.0 in | Wt 149.6 lb

## 2024-08-04 DIAGNOSIS — E782 Mixed hyperlipidemia: Secondary | ICD-10-CM

## 2024-08-04 DIAGNOSIS — I700 Atherosclerosis of aorta: Secondary | ICD-10-CM

## 2024-08-04 DIAGNOSIS — M150 Primary generalized (osteo)arthritis: Secondary | ICD-10-CM

## 2024-08-04 DIAGNOSIS — N289 Disorder of kidney and ureter, unspecified: Secondary | ICD-10-CM | POA: Diagnosis not present

## 2024-08-04 DIAGNOSIS — D751 Secondary polycythemia: Secondary | ICD-10-CM

## 2024-08-04 DIAGNOSIS — E559 Vitamin D deficiency, unspecified: Secondary | ICD-10-CM

## 2024-08-04 DIAGNOSIS — Z8669 Personal history of other diseases of the nervous system and sense organs: Secondary | ICD-10-CM

## 2024-08-04 MED ORDER — TRAMADOL HCL 50 MG PO TABS: 100.0000 mg | ORAL_TABLET | Freq: Two times a day (BID) | ORAL | 2 refills | Status: DC | PRN

## 2024-08-04 MED ORDER — ROSUVASTATIN CALCIUM 5 MG PO TABS: 5.0000 mg | ORAL_TABLET | Freq: Every day | ORAL | 3 refills | Status: AC

## 2024-08-04 MED ORDER — VITAMIN D (ERGOCALCIFEROL) 1.25 MG (50000 UNIT) PO CAPS: 50000.0000 [IU] | ORAL_CAPSULE | ORAL | 0 refills | Status: DC

## 2024-08-04 NOTE — Progress Notes (Signed)
 [facility] [address] [place], [place] [address]  Internal MEDICINE  Office Visit Note  Patient Name: Alexandria Murray  [id number]  [id number]  Date of Service: 08/04/2024  Chief Complaint  Patient presents with   Follow-up    HPI [name] presents for a follow-up visit for lab results.  Low vitamin D  level  High cholesterol -- she is only on zetia  which is not improving her cholesterol at all.  Abnormal kidney function -- low eGFR and elevated creatinine. Previous levels were normal. Patient thinks that she had a UTI when she had her labs drawn.  Elevated RBC, HGB and HCT -- on the last several labs.  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  Reminded patient to do her cologuard test and send it in.  Reminded patient to schedule her mammogram   Current Medication: Outpatient Encounter Medications as of 08/04/2024  Medication Sig   rosuvastatin (CRESTOR) 5 MG tablet Take 1 tablet (5 mg total) by mouth daily.   Aspirin -Acetaminophen -Caffeine  (PAMPRIN MAX PO) Take 2 tablets by mouth daily.   Budeson-Glycopyrrol-Formoterol (BREZTRI  AEROSPHERE) 160-9-4.8 MCG/ACT AERO Inhale 2 puffs into the lungs 2 (two) times daily.   docusate sodium  (COLACE) 100 MG capsule Take 1 capsule (100 mg total) by mouth 2 (two) times daily. (Patient not taking: Reported on 08/04/2024)   ezetimibe  (ZETIA ) 10 MG tablet Take 1 tablet (10 mg total) by mouth daily.   ferrous sulfate  325 (65 FE) MG tablet Take 1 tablet (325 mg total) by mouth daily.   folic acid  (FOLVITE ) 1 MG tablet Take 1 tablet by mouth once daily   gabapentin  (NEURONTIN ) 100 MG capsule Take 1 capsule (100 mg total) by mouth 2 (two) times daily.   meloxicam  (MOBIC ) 15 MG tablet Take 1 tablet (15 mg total) by mouth daily.   Multiple Vitamin (MULTIVITAMIN WITH MINERALS) TABS tablet Take 1 tablet by mouth daily.   Multiple Vitamins-Minerals (MULTIVITAMIN PO) Take 1 tablet by mouth daily.   ondansetron  (ZOFRAN -ODT) 4 MG  disintegrating tablet DISSOLVE 1 TABLET IN MOUTH EVERY 8 HOURS AS NEEDED FOR NAUSEA FOR VOMITING   traMADol  (ULTRAM ) 50 MG tablet Take 2 tablets (100 mg total) by mouth every 12 (twelve) hours as needed for moderate pain (pain score 4-6).   Vitamin D , Ergocalciferol , (DRISDOL ) 1.25 MG (50000 UNIT) CAPS capsule Take 1 capsule (50,000 Units total) by mouth once a week.   [DISCONTINUED] traMADol  (ULTRAM ) 50 MG tablet Take 2 tablets (100 mg total) by mouth every 12 (twelve) hours as needed for moderate pain (pain score 4-6).   [DISCONTINUED] Vitamin D , Ergocalciferol , (DRISDOL ) 1.25 MG (50000 UNIT) CAPS capsule Take 1 capsule by mouth once a week   No facility-administered encounter medications on file as of 08/04/2024.    Surgical History: Past Surgical History:  Procedure Laterality Date   CHOLECYSTECTOMY     COLPOSCOPY  07/26/2015   FINGER SURGERY      Medical History: Past Medical History:  Diagnosis Date   Abnormal Pap smear of cervix 06/20/2015   Anxiety    Basal cell carcinoma 09/21/2023   Left upper vermilion lip. Nodular and infiltrative pattern. Mohs 12/01/23.   Cervical high risk human papillomavirus (HPV) DNA test positive    Dysplasia of cervix, low grade (CIN 1) 07/26/2015   Guillain-Barre syndrome    Nicotine  dependence    Stroke [facility])    Torn rotator cuff     Family History: Family History  Problem Relation Age of Onset  Cancer Mother    Stroke Paternal Grandfather     Social History   Socioeconomic History   Marital status: Married    Spouse name: Not on file   Number of children: Not on file   Years of education: Not on file   Highest education level: Not on file  Occupational History   Not on file  Tobacco Use   Smoking status: Every Day    Current packs/day: 1.00    Types: Cigarettes   Smokeless tobacco: Never   Tobacco comments:    2 PPD  Vaping Use   Vaping status: Never Used  Substance and Sexual Activity   Alcohol use: No   Drug use: No    Sexual activity: Yes    Birth control/protection: Post-menopausal  Other Topics Concern   Not on file  Social History Narrative   Not on file   Social Drivers of Health   Financial Resource Strain: Not on file  Food Insecurity: Not on file  Transportation Needs: Not on file  Physical Activity: Not on file  Stress: Not on file  Social Connections: Not on file  Intimate Partner Violence: Not on file      Review of Systems  Constitutional:  Negative for chills, fatigue and unexpected weight change.  HENT:  Negative for congestion, rhinorrhea, sneezing and sore throat.   Respiratory:  Positive for cough. Negative for chest tightness, shortness of breath and wheezing.   Cardiovascular: Negative.  Negative for chest pain and palpitations.  Gastrointestinal:  Negative for abdominal pain, constipation, diarrhea, nausea and vomiting.  Genitourinary: Negative.   Musculoskeletal:  Positive for arthralgias (takes meloxicam  daily and tramadol  BID prn). Negative for back pain, joint swelling and neck pain.  Neurological: Negative.   Psychiatric/Behavioral:  Positive for sleep disturbance (takes trazodone ). Negative for behavioral problems (Depression) and suicidal ideas. The patient is nervous/anxious (improved, stopped lexapro ).     Vital Signs: BP 138/88   Pulse 98   Temp (!) 97.2 F (36.2 C)   Resp 16   Ht 5' (1.524 m)   Wt 149 lb 9.6 oz (67.9 kg)   LMP 02/02/2018 (Approximate)   SpO2 99%   BMI 29.22 kg/m    Physical Exam Vitals reviewed.  Constitutional:      General: She is not in acute distress.    Appearance: Normal appearance. She is obese. She is not ill-appearing.  HENT:     Head: Normocephalic and atraumatic.  Eyes:     Pupils: Pupils are equal, round, and reactive to light.  Cardiovascular:     Rate and Rhythm: Normal rate and regular rhythm.  Pulmonary:     Effort: Pulmonary effort is normal. No respiratory distress.  Musculoskeletal:     Comments: Wearing  compression sleeve on right arm  Neurological:     Mental Status: She is alert and oriented to person, place, and time.  Psychiatric:        Mood and Affect: Mood normal.        Behavior: Behavior normal.        Assessment/Plan: 1. Abnormal kidney function (Primary) Additional labs ordered. Repeat BMP to recheck kidney function  - Basic Metabolic Panel (BMET) - CBC with Differential/Platelet - Erythropoietin - Lactate Dehydrogenase (LDH) - Uric acid - JAK2 V617F rfx CALR/MPL/E12-15  2. Erythrocytosis Additional labs ordered. Will consider hematology referral pending results.  - Basic Metabolic Panel (BMET) - CBC with Differential/Platelet - Erythropoietin - Lactate Dehydrogenase (LDH) - Uric acid - JAK2 V617F  rfx CALR/MPL/E12-15  3. Aortic atherosclerosis Start rosuvastatin as prescribed. Continue zetia  as prescribed.  - rosuvastatin (CRESTOR) 5 MG tablet; Take 1 tablet (5 mg total) by mouth daily.  Dispense: 90 tablet; Refill: 3  4. Mixed hyperlipidemia Start rosuvastatin as prescribed. Continue zetia  as prescribed.  - rosuvastatin (CRESTOR) 5 MG tablet; Take 1 tablet (5 mg total) by mouth daily.  Dispense: 90 tablet; Refill: 3  5. Vitamin D  deficiency Restart weekly vitamin D  supplement. Will repeat vitamin D  lab in 6 months  - Vitamin D , Ergocalciferol , (DRISDOL ) 1.25 MG (50000 UNIT) CAPS capsule; Take 1 capsule (50,000 Units total) by mouth once a week.  Dispense: 12 capsule; Refill: 0  6. Primary generalized (osteo)arthritis Continue prn tramadol  as prescribed. Follow up in 3 months for additional refills  - traMADol  (ULTRAM ) 50 MG tablet; Take 2 tablets (100 mg total) by mouth every 12 (twelve) hours as needed for moderate pain (pain score 4-6).  Dispense: 120 tablet; Refill: 2  7. History of Guillain-Barre syndrome Continue prn tramadol  and patient has upcoming appt with neurology.  - traMADol  (ULTRAM ) 50 MG tablet; Take 2 tablets (100 mg total) by mouth every  12 (twelve) hours as needed for moderate pain (pain score 4-6).  Dispense: 120 tablet; Refill: 2   General Counseling: Jonaya verbalizes understanding of the findings of todays visit and agrees with plan of treatment. I have discussed any further diagnostic evaluation that may be needed or ordered today. We also reviewed her medications today. she has been encouraged to call the office with any questions or concerns that should arise related to todays visit.    Orders Placed This Encounter  Procedures   Basic Metabolic Panel (BMET)   CBC with Differential/Platelet   Erythropoietin   Lactate Dehydrogenase (LDH)   Uric acid   JAK2 V617F rfx CALR/MPL/E12-15    Meds ordered this encounter  Medications   Vitamin D , Ergocalciferol , (DRISDOL ) 1.25 MG (50000 UNIT) CAPS capsule    Sig: Take 1 capsule (50,000 Units total) by mouth once a week.    Dispense:  12 capsule    Refill:  0   rosuvastatin (CRESTOR) 5 MG tablet    Sig: Take 1 tablet (5 mg total) by mouth daily.    Dispense:  90 tablet    Refill:  3    Fill new script today.   traMADol  (ULTRAM ) 50 MG tablet    Sig: Take 2 tablets (100 mg total) by mouth every 12 (twelve) hours as needed for moderate pain (pain score 4-6).    Dispense:  120 tablet    Refill:  2    Additional refills will be sent at her appt this week    Return in about 3 months (around 10/31/2024) for F/U, pain med refill, Allisha Harter PCP.   Total time spent:30 Minutes Time spent includes review of chart, medications, test results, and follow up plan with the patient.   [place] Controlled Substance Database was reviewed by me.  This patient was seen by Mardy Maxin, FNP-C in collaboration with Dr. Sigrid Bathe as a part of collaborative care agreement.   Elayne Gruver R. Maxin, MSN, FNP-C Internal medicine

## 2024-08-16 DIAGNOSIS — Z8669 Personal history of other diseases of the nervous system and sense organs: Secondary | ICD-10-CM | POA: Diagnosis not present

## 2024-08-16 DIAGNOSIS — M7918 Myalgia, other site: Secondary | ICD-10-CM | POA: Diagnosis not present

## 2024-08-16 DIAGNOSIS — M2550 Pain in unspecified joint: Secondary | ICD-10-CM | POA: Diagnosis not present

## 2024-08-16 DIAGNOSIS — G479 Sleep disorder, unspecified: Secondary | ICD-10-CM | POA: Diagnosis not present

## 2024-09-01 DIAGNOSIS — R0902 Hypoxemia: Secondary | ICD-10-CM | POA: Diagnosis not present

## 2024-09-10 ENCOUNTER — Other Ambulatory Visit: Payer: Self-pay | Admitting: Internal Medicine

## 2024-09-10 DIAGNOSIS — J449 Chronic obstructive pulmonary disease, unspecified: Secondary | ICD-10-CM

## 2024-09-28 ENCOUNTER — Other Ambulatory Visit: Payer: Self-pay

## 2024-09-28 DIAGNOSIS — R0602 Shortness of breath: Secondary | ICD-10-CM

## 2024-10-01 DIAGNOSIS — R0902 Hypoxemia: Secondary | ICD-10-CM | POA: Diagnosis not present

## 2024-10-05 ENCOUNTER — Ambulatory Visit: Payer: BC Managed Care – PPO | Admitting: Internal Medicine

## 2024-10-05 DIAGNOSIS — R0602 Shortness of breath: Secondary | ICD-10-CM

## 2024-10-11 ENCOUNTER — Other Ambulatory Visit: Payer: Self-pay | Admitting: Nurse Practitioner

## 2024-10-11 DIAGNOSIS — J449 Chronic obstructive pulmonary disease, unspecified: Secondary | ICD-10-CM

## 2024-10-17 ENCOUNTER — Other Ambulatory Visit: Payer: Self-pay | Admitting: Nurse Practitioner

## 2024-10-17 DIAGNOSIS — Z79899 Other long term (current) drug therapy: Secondary | ICD-10-CM

## 2024-11-02 ENCOUNTER — Ambulatory Visit: Admitting: Nurse Practitioner

## 2024-11-02 ENCOUNTER — Encounter: Payer: Self-pay | Admitting: Nurse Practitioner

## 2024-11-02 VITALS — BP 170/80 | HR 79 | Temp 97.9°F | Resp 16 | Ht 60.0 in | Wt 154.4 lb

## 2024-11-02 DIAGNOSIS — E559 Vitamin D deficiency, unspecified: Secondary | ICD-10-CM

## 2024-11-02 DIAGNOSIS — J449 Chronic obstructive pulmonary disease, unspecified: Secondary | ICD-10-CM

## 2024-11-02 DIAGNOSIS — K130 Diseases of lips: Secondary | ICD-10-CM

## 2024-11-02 DIAGNOSIS — M150 Primary generalized (osteo)arthritis: Secondary | ICD-10-CM

## 2024-11-02 DIAGNOSIS — Z8669 Personal history of other diseases of the nervous system and sense organs: Secondary | ICD-10-CM

## 2024-11-02 DIAGNOSIS — Z79899 Other long term (current) drug therapy: Secondary | ICD-10-CM

## 2024-11-02 MED ORDER — MUPIROCIN 2 % EX OINT: 1.0000 | TOPICAL_OINTMENT | Freq: Two times a day (BID) | CUTANEOUS | 1 refills | Status: AC

## 2024-11-02 MED ORDER — VITAMIN D (ERGOCALCIFEROL) 1.25 MG (50000 UNIT) PO CAPS: 50000.0000 [IU] | ORAL_CAPSULE | ORAL | 0 refills | Status: AC

## 2024-11-02 MED ORDER — ONDANSETRON 4 MG PO TBDP: ORAL_TABLET | ORAL | 2 refills | Status: AC

## 2024-11-02 MED ORDER — BREZTRI AEROSPHERE 160-9-4.8 MCG/ACT IN AERO: 2.0000 | INHALATION_SPRAY | Freq: Two times a day (BID) | RESPIRATORY_TRACT | 12 refills | Status: AC

## 2024-11-02 MED ORDER — FOLIC ACID 1 MG PO TABS: 1.0000 mg | ORAL_TABLET | Freq: Every day | ORAL | 0 refills | Status: AC

## 2024-11-02 MED ORDER — EZETIMIBE 10 MG PO TABS: 10.0000 mg | ORAL_TABLET | Freq: Every day | ORAL | 3 refills | Status: AC

## 2024-11-02 MED ORDER — GABAPENTIN 100 MG PO CAPS: 200.0000 mg | ORAL_CAPSULE | Freq: Two times a day (BID) | ORAL | 5 refills | Status: AC

## 2024-11-02 MED ORDER — MELOXICAM 15 MG PO TABS: 15.0000 mg | ORAL_TABLET | Freq: Every day | ORAL | 3 refills | Status: AC

## 2024-11-02 MED ORDER — TRAMADOL HCL 50 MG PO TABS: 100.0000 mg | ORAL_TABLET | Freq: Two times a day (BID) | ORAL | 2 refills | Status: AC | PRN

## 2024-11-02 NOTE — Progress Notes (Signed)
 [facility] [address] [place], [place] [address]  Internal MEDICINE  Office Visit Note  Patient Name: Alexandria Murray  [id number]  [id number]  Date of Service: 11/02/2024  Chief Complaint  Patient presents with   Follow-up    HPI [name] presents for a follow-up visit for refills, hypertension, screenings nad high cholesterol.  Angular cheilitis -- x1 month, has been using lip balm and vasoline and it is not improving. She does wear dentures which increases the risk of this.  Reminded patient to do the cologuard test, she has the kit at home Patient given phone number to schedule her mammogram  Hypertension -- blood pressure remained elevated when rechecked. Per patient, she took her BP medication less than an hour ago so it has not had time to kick in.  COPD/emphysema -- uses breztri  inhaler twice daily for maintenance. PFT done in December, results are consistent with mild emphysema but her DLCO was significantly decreased and this correlated with her tobacco use. She does want to quit smoking and is working on this.     Current Medication: Outpatient Encounter Medications as of 11/02/2024  Medication Sig   mupirocin  ointment (BACTROBAN ) 2 % Apply 1 Application topically 2 (two) times daily. To the corners of the mouth until healed.   [DISCONTINUED] gabapentin  (NEURONTIN ) 100 MG capsule Take 200 mg by mouth 2 (two) times daily.   Aspirin -Acetaminophen -Caffeine  (PAMPRIN MAX PO) Take 2 tablets by mouth daily.   budesonide-glycopyrrolate-formoterol (BREZTRI  AEROSPHERE) 160-9-4.8 MCG/ACT AERO inhaler Inhale 2 puffs into the lungs 2 (two) times daily.   docusate sodium  (COLACE) 100 MG capsule Take 1 capsule (100 mg total) by mouth 2 (two) times daily. (Patient not taking: Reported on 08/04/2024)   ezetimibe  (ZETIA ) 10 MG tablet Take 1 tablet (10 mg total) by mouth daily.   ferrous sulfate  325 (65 FE) MG tablet Take 1 tablet (325 mg total) by mouth daily.   folic acid  (FOLVITE ) 1  MG tablet Take 1 tablet (1 mg total) by mouth daily.   gabapentin  (NEURONTIN ) 100 MG capsule Take 2 capsules (200 mg total) by mouth 2 (two) times daily.   meloxicam  (MOBIC ) 15 MG tablet Take 1 tablet (15 mg total) by mouth daily.   Multiple Vitamin (MULTIVITAMIN WITH MINERALS) TABS tablet Take 1 tablet by mouth daily.   Multiple Vitamins-Minerals (MULTIVITAMIN PO) Take 1 tablet by mouth daily.   ondansetron  (ZOFRAN -ODT) 4 MG disintegrating tablet DISSOLVE 1 TABLET IN MOUTH EVERY 8 HOURS AS NEEDED FOR NAUSEA FOR VOMITING   rosuvastatin  (CRESTOR ) 5 MG tablet Take 1 tablet (5 mg total) by mouth daily.   traMADol  (ULTRAM ) 50 MG tablet Take 2 tablets (100 mg total) by mouth every 12 (twelve) hours as needed for moderate pain (pain score 4-6).   Vitamin D , Ergocalciferol , (DRISDOL ) 1.25 MG (50000 UNIT) CAPS capsule Take 1 capsule (50,000 Units total) by mouth once a week.   [DISCONTINUED] BREZTRI  AEROSPHERE 160-9-4.8 MCG/ACT AERO inhaler INHALE 2 PUFFS TWICE DAILY   [DISCONTINUED] ezetimibe  (ZETIA ) 10 MG tablet Take 1 tablet (10 mg total) by mouth daily.   [DISCONTINUED] folic acid  (FOLVITE ) 1 MG tablet Take 1 tablet by mouth once daily   [DISCONTINUED] gabapentin  (NEURONTIN ) 100 MG capsule Take 1 capsule (100 mg total) by mouth 2 (two) times daily.   [DISCONTINUED] meloxicam  (MOBIC ) 15 MG tablet Take 1 tablet (15 mg total) by mouth daily.   [DISCONTINUED] ondansetron  (ZOFRAN -ODT) 4 MG disintegrating tablet DISSOLVE 1 TABLET IN MOUTH EVERY 8 HOURS AS NEEDED  FOR NAUSEA FOR VOMITING   [DISCONTINUED] traMADol  (ULTRAM ) 50 MG tablet Take 2 tablets (100 mg total) by mouth every 12 (twelve) hours as needed for moderate pain (pain score 4-6).   [DISCONTINUED] Vitamin D , Ergocalciferol , (DRISDOL ) 1.25 MG (50000 UNIT) CAPS capsule Take 1 capsule (50,000 Units total) by mouth once a week.   No facility-administered encounter medications on file as of 11/02/2024.    Surgical History: Past Surgical History:   Procedure Laterality Date   CHOLECYSTECTOMY     COLPOSCOPY  07/26/2015   FINGER SURGERY      Medical History: Past Medical History:  Diagnosis Date   Abnormal Pap smear of cervix 06/20/2015   Anxiety    Basal cell carcinoma 09/21/2023   Left upper vermilion lip. Nodular and infiltrative pattern. Mohs 12/01/23.   Cervical high risk human papillomavirus (HPV) DNA test positive    Dysplasia of cervix, low grade (CIN 1) 07/26/2015   Guillain-Barre syndrome    Nicotine  dependence    Stroke [facility])    Torn rotator cuff     Family History: Family History  Problem Relation Age of Onset   Cancer Mother    Stroke Paternal Grandfather     Social History   Socioeconomic History   Marital status: Married    Spouse name: Not on file   Number of children: Not on file   Years of education: Not on file   Highest education level: Not on file  Occupational History   Not on file  Tobacco Use   Smoking status: Every Day    Current packs/day: 1.00    Types: Cigarettes   Smokeless tobacco: Never   Tobacco comments:    2 PPD  Vaping Use   Vaping status: Never Used  Substance and Sexual Activity   Alcohol use: No   Drug use: No   Sexual activity: Yes    Birth control/protection: Post-menopausal  Other Topics Concern   Not on file  Social History Narrative   Not on file   Social Drivers of Health   Tobacco Use: High Risk (11/02/2024)   Patient History    Smoking Tobacco Use: Every Day    Smokeless Tobacco Use: Never    Passive Exposure: Not on file  Financial Resource Strain: Low Risk  (08/16/2024)   Received from [facility]   Overall Financial Resource Strain (CARDIA)    Difficulty of Paying Living Expenses: Not very hard  Food Insecurity: No Food Insecurity (08/16/2024)   Received from [facility]   Epic    Within the past 12 months, you worried that your food would run out before you got the money to buy more.: Never true    Within the  past 12 months, the food you bought just didn't last and you didn't have money to get more.: Never true  Transportation Needs: No Transportation Needs (08/16/2024)   Received from [facility]    In the past 12 months, has lack of transportation kept you from medical appointments or from getting medications?: No    Lack of Transportation (Non-Medical): No  Physical Activity: Not on file  Stress: Not on file  Social Connections: Not on file  Intimate Partner Violence: Not on file  Depression (PHQ2-9): Low Risk (02/17/2024)   Depression (PHQ2-9)    PHQ-2 Score: 0  Alcohol Screen: Low Risk (02/17/2022)   Alcohol Screen    Last Alcohol Screening Score (AUDIT): 0  Housing:  Low Risk  (08/16/2024)   Received from [facility]   Epic    In the last 12 months, was there a time when you were not able to pay the mortgage or rent on time?: No    In the past 12 months, how many times have you moved where you were living?: 0    At any time in the past 12 months, were you homeless or living in a shelter (including now)?: No  Utilities: Not At Risk (08/16/2024)   Received from [facility]   Epic    In the past 12 months has the electric, gas, oil, or water company threatened to shut off services in your home?: No  Health Literacy: Not on file      Review of Systems  Constitutional:  Negative for chills, fatigue and unexpected weight change.  HENT:  Negative for congestion, rhinorrhea, sneezing and sore throat.   Respiratory:  Negative for cough, chest tightness, shortness of breath and wheezing.   Cardiovascular: Negative.  Negative for chest pain and palpitations.  Gastrointestinal:  Negative for abdominal pain, constipation, diarrhea, nausea and vomiting.  Genitourinary: Negative.   Musculoskeletal:  Positive for arthralgias (takes meloxicam  daily and tramadol  BID prn). Negative for back pain, joint swelling and neck  pain.  Neurological: Negative.   Psychiatric/Behavioral:  Positive for sleep disturbance (takes trazodone ). Negative for behavioral problems (Depression) and suicidal ideas. The patient is nervous/anxious (improved, stopped lexapro ).     Vital Signs: BP (!) 170/80 Comment: 170/110  Pulse 79   Temp 97.9 F (36.6 C)   Resp 16   Ht 5' (1.524 m)   Wt 154 lb 6.4 oz (70 kg)   LMP 02/02/2018   SpO2 96%   BMI 30.15 kg/m    Physical Exam Vitals reviewed.  Constitutional:      General: She is not in acute distress.    Appearance: Normal appearance. She is obese. She is not ill-appearing.  HENT:     Head: Normocephalic and atraumatic.  Eyes:     Pupils: Pupils are equal, round, and reactive to light.  Cardiovascular:     Rate and Rhythm: Normal rate and regular rhythm.  Pulmonary:     Effort: Pulmonary effort is normal. No respiratory distress.  Musculoskeletal:     Comments: Wearing compression sleeve on right arm  Neurological:     Mental Status: She is alert and oriented to person, place, and time.  Psychiatric:        Mood and Affect: Mood normal.        Behavior: Behavior normal.        Assessment/Plan: 1. Angular cheilitis (Primary) Mupirocin  ointment prescribed.  - mupirocin  ointment (BACTROBAN ) 2 %; Apply 1 Application topically 2 (two) times daily. To the corners of the mouth until healed.  Dispense: 22 g; Refill: 1  2. Chronic obstructive pulmonary disease, unspecified COPD type (HCC) Continue breztri  inhaler as prescribed.  - budesonide-glycopyrrolate-formoterol (BREZTRI  AEROSPHERE) 160-9-4.8 MCG/ACT AERO inhaler; Inhale 2 puffs into the lungs 2 (two) times daily.  Dispense: 11 g; Refill: 12  3. Primary generalized (osteo)arthritis Continue meloxicam  and tramadol  as prescribed.  - traMADol  (ULTRAM ) 50 MG tablet; Take 2 tablets (100 mg total) by mouth every 12 (twelve) hours as needed for moderate pain (pain score 4-6).  Dispense: 120 tablet; Refill: 2 -  meloxicam  (MOBIC ) 15 MG tablet; Take 1 tablet (15 mg total) by mouth daily.  Dispense: 90 tablet; Refill: 3  4. Vitamin  D deficiency Continue weekly vitamin D  supplement as prescribed.  - Vitamin D , Ergocalciferol , (DRISDOL ) 1.25 MG (50000 UNIT) CAPS capsule; Take 1 capsule (50,000 Units total) by mouth once a week.  Dispense: 12 capsule; Refill: 0  5. Encounter for medication review Medication list reviewed, updated and refills ordered  - ondansetron  (ZOFRAN -ODT) 4 MG disintegrating tablet; DISSOLVE 1 TABLET IN MOUTH EVERY 8 HOURS AS NEEDED FOR NAUSEA FOR VOMITING  Dispense: 30 tablet; Refill: 2 - folic acid  (FOLVITE ) 1 MG tablet; Take 1 tablet (1 mg total) by mouth daily.  Dispense: 90 tablet; Refill: 0 - ezetimibe  (ZETIA ) 10 MG tablet; Take 1 tablet (10 mg total) by mouth daily.  Dispense: 90 tablet; Refill: 3 - gabapentin  (NEURONTIN ) 100 MG capsule; Take 2 capsules (200 mg total) by mouth 2 (two) times daily.  Dispense: 120 capsule; Refill: 5  6. History of Guillain-Barre syndrome Continue prn tramadol  as prescribed.  - traMADol  (ULTRAM ) 50 MG tablet; Take 2 tablets (100 mg total) by mouth every 12 (twelve) hours as needed for moderate pain (pain score 4-6).  Dispense: 120 tablet; Refill: 2   General Counseling: Roshanna verbalizes understanding of the findings of todays visit and agrees with plan of treatment. I have discussed any further diagnostic evaluation that may be needed or ordered today. We also reviewed her medications today. she has been encouraged to call the office with any questions or concerns that should arise related to todays visit.    No orders of the defined types were placed in this encounter.   Meds ordered this encounter  Medications   mupirocin  ointment (BACTROBAN ) 2 %    Sig: Apply 1 Application topically 2 (two) times daily. To the corners of the mouth until healed.    Dispense:  22 g    Refill:  1    Fill new script today   traMADol  (ULTRAM ) 50 MG tablet     Sig: Take 2 tablets (100 mg total) by mouth every 12 (twelve) hours as needed for moderate pain (pain score 4-6).    Dispense:  120 tablet    Refill:  2    Additional refills will be sent at her appt this week   meloxicam  (MOBIC ) 15 MG tablet    Sig: Take 1 tablet (15 mg total) by mouth daily.    Dispense:  90 tablet    Refill:  3   ondansetron  (ZOFRAN -ODT) 4 MG disintegrating tablet    Sig: DISSOLVE 1 TABLET IN MOUTH EVERY 8 HOURS AS NEEDED FOR NAUSEA FOR VOMITING    Dispense:  30 tablet    Refill:  2   folic acid  (FOLVITE ) 1 MG tablet    Sig: Take 1 tablet (1 mg total) by mouth daily.    Dispense:  90 tablet    Refill:  0   ezetimibe  (ZETIA ) 10 MG tablet    Sig: Take 1 tablet (10 mg total) by mouth daily.    Dispense:  90 tablet    Refill:  3    High cholesterol and history of stroke. Statins have caused excess joint and muscle pain.   budesonide-glycopyrrolate-formoterol (BREZTRI  AEROSPHERE) 160-9-4.8 MCG/ACT AERO inhaler    Sig: Inhale 2 puffs into the lungs 2 (two) times daily.    Dispense:  11 g    Refill:  12   Vitamin D , Ergocalciferol , (DRISDOL ) 1.25 MG (50000 UNIT) CAPS capsule    Sig: Take 1 capsule (50,000 Units total) by mouth once a week.    Dispense:  12 capsule    Refill:  0   gabapentin  (NEURONTIN ) 100 MG capsule    Sig: Take 2 capsules (200 mg total) by mouth 2 (two) times daily.    Dispense:  120 capsule    Refill:  5    Fill new script today, discontinue previous gabapentin  prescriptions.    Return in about 2 months (around 01/17/2025) for F/U, pain med refill, Graysyn Bache PCP and need a nurse visit to recheck BP in 1-2 weeks .   Total time spent:30 Minutes Time spent includes review of chart, medications, test results, and follow up plan with the patient.   [place] Controlled Substance Database was reviewed by me.  This patient was seen by Mardy Maxin, FNP-C in collaboration with Dr. Sigrid Bathe as a part of collaborative care agreement.   Takashi Korol R.  Maxin, MSN, FNP-C Internal medicine

## 2024-11-10 NOTE — Procedures (Signed)
 [facility] [address] [place], [address]    Complete Pulmonary Function Testing Interpretation:  FINDINGS:  Forced vital capacity is normal.  FEV1 is 1.65 L which is 69% of predicted and is mildly decreased.  FEV1 FVC ratio was mildly decreased.  Postbronchodilator no significant change in FEV1 is noted.  Total lung capacity is normal.  Residual volume is increased.  FRC is increased.  DLCO is severely decreased.  IMPRESSION:  This pulmonary function study is consistent with mild obstructive lung disease clinical correlation is recommended.  Alexandria DELENA Bathe, MD [facility] Pulmonary Critical Care Medicine Sleep Medicine

## 2024-11-14 LAB — PULMONARY FUNCTION TEST

## 2024-11-16 ENCOUNTER — Ambulatory Visit

## 2025-01-18 ENCOUNTER — Ambulatory Visit: Admitting: Nurse Practitioner

## 2025-02-17 ENCOUNTER — Encounter: Admitting: Nurse Practitioner

## 2025-10-18 ENCOUNTER — Encounter: Admitting: Internal Medicine
